# Patient Record
Sex: Male | Born: 1975 | Race: White | Hispanic: No | Marital: Married | Smoking: Former smoker
Health system: Southern US, Community
[De-identification: ages and names within clinical notes are randomized; demographics above are authoritative.]

## PROBLEM LIST (undated history)

## (undated) DIAGNOSIS — I209 Angina pectoris, unspecified: Secondary | ICD-10-CM

## (undated) DIAGNOSIS — I2510 Atherosclerotic heart disease of native coronary artery without angina pectoris: Secondary | ICD-10-CM

## (undated) DIAGNOSIS — E785 Hyperlipidemia, unspecified: Secondary | ICD-10-CM

## (undated) DIAGNOSIS — M542 Cervicalgia: Secondary | ICD-10-CM

## (undated) DIAGNOSIS — I10 Essential (primary) hypertension: Secondary | ICD-10-CM

## (undated) DIAGNOSIS — K219 Gastro-esophageal reflux disease without esophagitis: Secondary | ICD-10-CM

## (undated) DIAGNOSIS — G4730 Sleep apnea, unspecified: Secondary | ICD-10-CM

## (undated) HISTORY — DX: Cervicalgia: M54.2

## (undated) HISTORY — PX: CARDIAC CATHETERIZATION: SHX172

## (undated) HISTORY — PX: ELBOW SURGERY: SHX618

## (undated) HISTORY — DX: Hyperlipidemia, unspecified: E78.5

## (undated) HISTORY — DX: Essential (primary) hypertension: I10

## (undated) HISTORY — PX: HAND SURGERY: SHX662

---

## 2000-09-02 ENCOUNTER — Encounter: Payer: Self-pay | Admitting: Family Medicine

## 2000-09-02 ENCOUNTER — Encounter: Admission: RE | Admit: 2000-09-02 | Discharge: 2000-09-02 | Payer: Self-pay | Admitting: Family Medicine

## 2003-06-18 ENCOUNTER — Ambulatory Visit (HOSPITAL_BASED_OUTPATIENT_CLINIC_OR_DEPARTMENT_OTHER): Admission: RE | Admit: 2003-06-18 | Discharge: 2003-06-18 | Payer: Self-pay | Admitting: Internal Medicine

## 2007-01-03 ENCOUNTER — Ambulatory Visit: Payer: Self-pay | Admitting: Pulmonary Disease

## 2007-01-09 ENCOUNTER — Ambulatory Visit (HOSPITAL_BASED_OUTPATIENT_CLINIC_OR_DEPARTMENT_OTHER): Admission: RE | Admit: 2007-01-09 | Discharge: 2007-01-09 | Payer: Self-pay | Admitting: Pulmonary Disease

## 2007-01-20 ENCOUNTER — Ambulatory Visit: Payer: Self-pay | Admitting: Pulmonary Disease

## 2007-01-24 ENCOUNTER — Ambulatory Visit: Payer: Self-pay | Admitting: Pulmonary Disease

## 2007-02-08 DIAGNOSIS — G4733 Obstructive sleep apnea (adult) (pediatric): Secondary | ICD-10-CM | POA: Insufficient documentation

## 2010-07-21 NOTE — Procedures (Signed)
David Christian, David Christian                ACCOUNT NO.:  [id number]   MEDICAL RECORD NO.:  [id number]          PATIENT TYPE:  OUT   LOCATION:  SLEEP CENTER                 FACILITY:  [facility]   PHYSICIAN:  Barbaraann Share, MD,FCCPDATE OF BIRTH:  12-06-1976   DATE OF STUDY:  01/09/2007                            NOCTURNAL POLYSOMNOGRAM   REFERRING PHYSICIAN:  Barbaraann Share, MD,FCCP   INDICATION FOR STUDY:  Hypersomnia with sleep apnea.   EPWORTH SLEEPINESS SCORE:  17   MEDICATIONS:   SLEEP ARCHITECTURE:  The patient had a total sleep time of 354 minutes  with minimal slow wave sleep and only 41 minutes of REM.  Sleep onset  latency was prolonged at 64 minutes and REM onset was prolonged at 149  minutes.  Sleep efficiency was decreased at 80%.   RESPIRATORY DATA:  The patient was found to have 20 obstructive apneas  and 57 obstructive hypopneas for an apnea/hypopnea index of 14 events  per hour.  The events were not positional but they were clearly worse  with respect to desaturation during REM.  There was moderate snoring  noted throughout.   OXYGEN DATA:  There was O2 desaturation as low as 70% with the patient's  obstructive events.   CARDIAC DATA:  No clinically significant cardiac arrhythmias were noted.   MOVEMENT-PARASOMNIA:  None.   IMPRESSIONS-RECOMMENDATIONS:  Mild obstructive sleep apnea/hypopnea  syndrome with an apnea/hypopnea index of 14 events per hour and oxygen  desaturation as low as 70%.  Treatment for this degree of sleep apnea  can include weight loss alone if applicable, upper airway surgery, oral  appliance, and also CPAP.  Clinical correlation is suggested.      Barbaraann Share, MD,FCCP  Diplomate, American Board of Sleep  Medicine  Electronically Signed    [facility]  D:  01/21/2007 14:48:00  T:  01/22/2007 21:03:32  Job:  [id number]

## 2010-07-21 NOTE — Assessment & Plan Note (Signed)
[facility]                             PULMONARY OFFICE NOTE   NAME:David Christian, David Christian                       MRN:          [id number]  DATE:01/03/2007                            DOB:          25-Dec-1975    REFERRING PHYSICIAN:  Gabriel Earing, M.D.   SLEEP MEDICINE CONSULTATION   HISTORY OF PRESENT ILLNESS:  The patient is a 35 year old male whom I  have been asked to see for obstructive sleep apnea. The patient states  that he did have nocturnal polysomnography approximately three years ago  where he was diagnosed with mild obstructive sleep apnea. That data is  not available to me currently. The patient was felt not to be a  candidate for CPAP or surgery and was asked to work on weight loss and  also placed on Vivactil as a trial which did not help. The patient  states that currently he has been having a lot of snoring as well as  pauses in his breathing during sleep according to his girlfriend. He  typically goes to bed between 9:30 and 10:30 and gets up at 6 a.m. to  start his day. However, it is very difficult for him to get up and he  will often not arise until between 7 and 10 a.m. and will often be late  for work. He is not rested in the mornings whenever he arises and  describes a chronic a.m. headache. The patient also states that he does  a lot of kicking during the night according to his girlfriend and does  have some suggestion of the restless leg syndrome in the evenings.  During the day, the patient works as a Chartered certified accountant and states that he has  significant inappropriate daytime sleepiness with periods of inactivity.  He will fall asleep very easily with TV and movies in the evening and  does have some sleepiness with driving. The patient states that his  weight is actually down 30 pounds from his prior sleep study.   PAST MEDICAL HISTORY:  Unremarkable.   CURRENT MEDICATIONS:  The patient takes no medications.   ALLERGIES:  No known  drug allergies.   SOCIAL HISTORY:  He is single. He has a history of smoking one pack per  day for the last 5-6 years.   FAMILY HISTORY:  Is noncontributory in first-degree relatives.   REVIEW OF SYSTEMS:  As per history of present illness. Also, see the  patient intake form documented in the chart.   PHYSICAL EXAMINATION:  In general, he is an obese male in no acute  distress. Blood pressure 138/96, pulse 94, temperature 99.0, weight 260  pounds. He is 6 feet tall. O2 saturation on room air is 100%.  HEENT: Pupils equal, round and reactive to light and accommodation.  Extraocular muscles are intact. Nares: Shows deviated septum to the left  with turbinate hypertrophy. Oropharynx shows very large tonsils with  elongation of the soft palate and uvula.  NECK: Is supple, but large without JVD, lymphadenopathy or  thyromegaly.  CHEST: Is totally clear.  CARDIAC: Reveals regular rate and rhythm. No  murmurs, rubs or gallops.  ABDOMEN: Soft and nontender with good bowel sounds.  GENITAL, RECTAL, BREASTS: Was not done and not indicated.  LOWER EXTREMITIES: Are without edema. Pulses are intact distally.  NEUROLOGIC: Alert and oriented with no obvious motor deficits.   IMPRESSION:  Probable obstructive sleep apnea. The patient gives a very  good history for this, is obese and has abnormal upper airway anatomy.  He believes that his symptoms are much worse than at the time of his  prior sleep study. At this point in time, I think he needs a repeat  sleep study with followup thereafter. He is agreeable to this approach.   PLAN:  1. Schedule for nocturnal polysomnogram.  2. Work on weight loss.  3. Will try to obtain his old study data.     Barbaraann Share, MD,FCCP  Electronically Signed    [facility]  DD: 01/04/2007  DT: 01/04/2007  Job #: [id number]   cc:   Gabriel Earing, M.D.

## 2013-04-06 ENCOUNTER — Encounter: Payer: Self-pay | Admitting: Pulmonary Disease

## 2013-04-06 ENCOUNTER — Encounter: Payer: Self-pay | Admitting: Internal Medicine

## 2013-05-23 ENCOUNTER — Ambulatory Visit (INDEPENDENT_AMBULATORY_CARE_PROVIDER_SITE_OTHER): Payer: BC Managed Care – PPO | Admitting: Internal Medicine

## 2013-05-23 ENCOUNTER — Encounter: Payer: Self-pay | Admitting: Internal Medicine

## 2013-05-23 ENCOUNTER — Telehealth: Payer: Self-pay | Admitting: Internal Medicine

## 2013-05-23 VITALS — BP 142/104 | HR 98 | Temp 98.9°F | Ht 70.75 in | Wt 251.0 lb

## 2013-05-23 DIAGNOSIS — R030 Elevated blood-pressure reading, without diagnosis of hypertension: Secondary | ICD-10-CM

## 2013-05-23 DIAGNOSIS — K644 Residual hemorrhoidal skin tags: Secondary | ICD-10-CM | POA: Insufficient documentation

## 2013-05-23 DIAGNOSIS — Z0000 Encounter for general adult medical examination without abnormal findings: Secondary | ICD-10-CM

## 2013-05-23 DIAGNOSIS — E669 Obesity, unspecified: Secondary | ICD-10-CM

## 2013-05-23 LAB — LIPID PANEL
Cholesterol: 284 mg/dL — ABNORMAL HIGH (ref 0–200)
HDL: 38.9 mg/dL — ABNORMAL LOW (ref >39.00)
LDL Cholesterol: 205 mg/dL — ABNORMAL HIGH (ref 0–99)
Total CHOL/HDL Ratio: 7
Triglycerides: 200 mg/dL — ABNORMAL HIGH (ref 0.0–149.0)
VLDL: 40 mg/dL (ref 0.0–40.0)

## 2013-05-23 LAB — CBC
HCT: 47.7 % (ref 39.0–52.0)
Hemoglobin: 16.4 g/dL (ref 13.0–17.0)
MCHC: 34.3 g/dL (ref 30.0–36.0)
MCV: 91.4 fl (ref 78.0–100.0)
Platelets: 214 10*3/uL (ref 150.0–400.0)
RBC: 5.22 Mil/uL (ref 4.22–5.81)
RDW: 12.8 % (ref 11.5–14.6)
WBC: 4.9 10*3/uL (ref 4.5–10.5)

## 2013-05-23 LAB — COMPREHENSIVE METABOLIC PANEL
ALT: 15 U/L (ref 0–53)
AST: 20 U/L (ref 0–37)
Albumin: 4.4 g/dL (ref 3.5–5.2)
Alkaline Phosphatase: 54 U/L (ref 39–117)
BUN: 11 mg/dL (ref 6–23)
CO2: 29 mEq/L (ref 19–32)
Calcium: 9.2 mg/dL (ref 8.4–10.5)
Chloride: 99 mEq/L (ref 96–112)
Creatinine, Ser: 1.2 mg/dL (ref 0.4–1.5)
GFR: 76.33 mL/min (ref >60.00)
Glucose, Bld: 96 mg/dL (ref 70–99)
Potassium: 3.8 mEq/L (ref 3.5–5.1)
Sodium: 136 mEq/L (ref 135–145)
Total Bilirubin: 0.5 mg/dL (ref 0.3–1.2)
Total Protein: 7.7 g/dL (ref 6.0–8.3)

## 2013-05-23 LAB — HEMOGLOBIN A1C: Hgb A1c MFr Bld: 5.2 % (ref 4.6–6.5)

## 2013-05-23 LAB — TSH: TSH: 0.81 u[IU]/mL (ref 0.35–5.50)

## 2013-05-23 NOTE — Progress Notes (Signed)
Pre visit review using our clinic review tool, if applicable. No additional management support is needed unless otherwise documented below in the visit note. 

## 2013-05-23 NOTE — Assessment & Plan Note (Signed)
?   Medication related (he has been taking claritin D and cough syrup) Stop those, will recheck BP in 2 weeks

## 2013-05-23 NOTE — Assessment & Plan Note (Addendum)
Continue preparation H prn Avoid straining Take Colace OTC prn Increase your water intake

## 2013-05-23 NOTE — Progress Notes (Signed)
HPI Pt presents to the clinic today to establish care. He has not had a PCP in many years. He does have a few concerns today.   1: He c/o post nasal drip and cough. He reports this started 2 days ago. The cough is productive of thick green mucous. The cough is worse at night. He denies fever, but has had chills and body aches. He also c/o headache and fatigue. He has tried Claritin- D and Delsym OTC. He has had sick contacts. He has a history of seasonal allergies but denies breathing problems.  2: Hemorrhoids: He does c/o of rectal bleeding and pain. He reports this started about 2 years ago. He does have a BM daily. He denies anal itching. He does not feel like he strains with bowel movements. He has tried preparation H OTC with some relief.  Flu: 2013 Tetanus: < 10 years ago Dentist: as needed  History reviewed. No pertinent past medical history.  No current outpatient prescriptions on file.   No current facility-administered medications for this visit.    No Known Allergies  Family History  Problem Relation Age of Onset  . Arthritis Maternal Grandfather   . Arthritis Paternal Grandmother   . Diabetes Neg Hx   . Heart disease Neg Hx   . Stroke Neg Hx     History   Social History  . Marital Status: Married    Spouse Name: N/A    Number of Children: N/A  . Years of Education: N/A   Occupational History  . Not on file.   Social History Main Topics  . Smoking status: Never Smoker   . Smokeless tobacco: Current User    Types: Chew  . Alcohol Use: 0.6 oz/week    1 Cans of beer per week     Comment: rare  . Drug Use: No  . Sexual Activity: Not on file   Other Topics Concern  . Not on file   Social History Narrative  . No narrative on file    ROS:  Constitutional: Pt reports fatigue. Denies fever, malaise, headache or abrupt weight changes.  HEENT: Pt reports sore throat. Denies eye pain, eye redness, ear pain, ringing in the ears, wax buildup, runny nose, nasal  congestion, bloody nose, or sore throat. Respiratory: Pt reports cough. Denies difficulty breathing, shortness of breath, or sputum production.   Cardiovascular: Denies chest pain, chest tightness, palpitations or swelling in the hands or feet.  Gastrointestinal: Denies abdominal pain, bloating, constipation, diarrhea or blood in the stool.  GU: Denies frequency, urgency, pain with urination, blood in urine, odor or discharge. Musculoskeletal: Denies decrease in range of motion, difficulty with gait, muscle pain or joint pain and swelling.  Skin: Denies redness, rashes, lesions or ulcercations.  Neurological: Denies dizziness, difficulty with memory, difficulty with speech or problems with balance and coordination.   No other specific complaints in a complete review of systems (except as listed in HPI above).  PE:  BP 142/104  Pulse 98  Temp(Src) 98.9 F (37.2 C) (Oral)  Ht 5' 10.75" (1.797 m)  Wt 251 lb (113.853 kg)  BMI 35.26 kg/m2  SpO2 98% Wt Readings from Last 3 Encounters:  05/23/13 251 lb (113.853 kg)    General: Appears his stated age, obese but well developed, well nourished in NAD. HEENT: Head: normal shape and size; Eyes: sclera white, no icterus, conjunctiva pink, PERRLA and EOMs intact; Ears: Tm's gray and intact, normal light reflex; Nose: mucosa pink and moist, septum midline;  Throat/Mouth: Teeth present, mucosa pink and moist, no lesions or ulcerations noted.  Neck: Normal range of motion. Neck supple, trachea midline. No massses, lumps or thyromegaly present.  Cardiovascular: Normal rate and rhythm. S1,S2 noted.  No murmur, rubs or gallops noted. No JVD or BLE edema. No carotid bruits noted. Pulmonary/Chest: Normal effort and positive vesicular breath sounds. No respiratory distress. No wheezes, rales or ronchi noted.  Abdomen: Soft and nontender. Normal bowel sounds, no bruits noted. No distention or masses noted. Liver, spleen and kidneys non palpable. Rectal: 2  small external hemorrhoids, not bleeding, not thrombosed. Musculoskeletal: Normal range of motion. No signs of joint swelling. No difficulty with gait.  Neurological: Alert and oriented. Cranial nerves II-XII intact. Coordination normal. +DTRs bilaterally. Psychiatric: Mood and affect normal. Behavior is normal. Judgment and thought content normal.      Assessment and Plan:  Preventative Health Maintenance:  Encouraged him to work on diet or exercise Encouraged him to visit a dentist on a regular basis Will obtain screening labs at this time including A1C today  RTC in 2 weeks to reasses BP

## 2013-05-23 NOTE — Telephone Encounter (Signed)
Relevant patient education mailed to patient.  

## 2013-05-23 NOTE — Assessment & Plan Note (Signed)
Encouraged him to work on diet and exercise 

## 2013-05-23 NOTE — Patient Instructions (Addendum)

## 2013-05-25 ENCOUNTER — Encounter: Payer: Self-pay | Admitting: Internal Medicine

## 2013-06-14 ENCOUNTER — Encounter: Payer: Self-pay | Admitting: Internal Medicine

## 2013-07-03 ENCOUNTER — Encounter: Payer: Self-pay | Admitting: Internal Medicine

## 2013-07-03 ENCOUNTER — Ambulatory Visit (INDEPENDENT_AMBULATORY_CARE_PROVIDER_SITE_OTHER): Payer: BC Managed Care – PPO | Admitting: Internal Medicine

## 2013-07-03 VITALS — BP 130/88 | HR 73 | Temp 98.2°F | Ht 70.75 in | Wt 250.5 lb

## 2013-07-03 DIAGNOSIS — K644 Residual hemorrhoidal skin tags: Secondary | ICD-10-CM

## 2013-07-03 DIAGNOSIS — R030 Elevated blood-pressure reading, without diagnosis of hypertension: Secondary | ICD-10-CM

## 2013-07-03 MED ORDER — HYDROCORTISONE ACE-PRAMOXINE 2.5-1 % RE CREA: 1.0000 "application " | TOPICAL_CREAM | Freq: Three times a day (TID) | RECTAL | Status: DC

## 2013-07-03 NOTE — Patient Instructions (Addendum)
Hemorrhoids Hemorrhoids are swollen veins around the rectum or anus. There are two types of hemorrhoids:   Internal hemorrhoids. These occur in the veins just inside the rectum. They may poke through to the outside and become irritated and painful.  External hemorrhoids. These occur in the veins outside the anus and can be felt as a painful swelling or hard lump near the anus. CAUSES  Pregnancy.   Obesity.   Constipation or diarrhea.   Straining to have a bowel movement.   Sitting for long periods on the toilet.  Heavy lifting or other activity that caused you to strain.  Anal intercourse. SYMPTOMS   Pain.   Anal itching or irritation.   Rectal bleeding.   Fecal leakage.   Anal swelling.   One or more lumps around the anus.  DIAGNOSIS  Your caregiver may be able to diagnose hemorrhoids by visual examination. Other examinations or tests that may be performed include:   Examination of the rectal area with a gloved hand (digital rectal exam).   Examination of anal canal using a small tube (scope).   A blood test if you have lost a significant amount of blood.  A test to look inside the colon (sigmoidoscopy or colonoscopy). TREATMENT Most hemorrhoids can be treated at home. However, if symptoms do not seem to be getting better or if you have a lot of rectal bleeding, your caregiver may perform a procedure to help make the hemorrhoids get smaller or remove them completely. Possible treatments include:   Placing a rubber band at the base of the hemorrhoid to cut off the circulation (rubber band ligation).   Injecting a chemical to shrink the hemorrhoid (sclerotherapy).   Using a tool to burn the hemorrhoid (infrared light therapy).   Surgically removing the hemorrhoid (hemorrhoidectomy).   Stapling the hemorrhoid to block blood flow to the tissue (hemorrhoid stapling).  HOME CARE INSTRUCTIONS   Eat foods with fiber, such as whole grains, beans,  nuts, fruits, and vegetables. Ask your doctor about taking products with added fiber in them (fibersupplements).  Increase fluid intake. Drink enough water and fluids to keep your urine clear or pale yellow.   Exercise regularly.   Go to the bathroom when you have the urge to have a bowel movement. Do not wait.   Avoid straining to have bowel movements.   Keep the anal area dry and clean. Use wet toilet paper or moist towelettes after a bowel movement.   Medicated creams and suppositories may be used or applied as directed.   Only take over-the-counter or prescription medicines as directed by your caregiver.   Take warm sitz baths for 15 20 minutes, 3 4 times a day to ease pain and discomfort.   Place ice packs on the hemorrhoids if they are tender and swollen. Using ice packs between sitz baths may be helpful.   Put ice in a plastic bag.   Place a towel between your skin and the bag.   Leave the ice on for 15 20 minutes, 3 4 times a day.   Do not use a donut-shaped pillow or sit on the toilet for long periods. This increases blood pooling and pain.  SEEK MEDICAL CARE IF:  You have increasing pain and swelling that is not controlled by treatment or medicine.  You have uncontrolled bleeding.  You have difficulty or you are unable to have a bowel movement.  You have pain or inflammation outside the area of the hemorrhoids. MAKE SURE YOU:    Understand these instructions.  Will watch your condition.  Will get help right away if you are not doing well or get worse. Document Released: 02/20/2000 Document Revised: 02/09/2012 Document Reviewed: 12/28/2011 ExitCare Patient Information 2014 [employer].  

## 2013-07-03 NOTE — Progress Notes (Signed)
Pre visit review using our clinic review tool, if applicable. No additional management support is needed unless otherwise documented below in the visit note. 

## 2013-07-03 NOTE — Progress Notes (Signed)
Subjective:    Patient ID: David Christian, male    DOB: April 15, 1975, 38 y.o.   MRN: [id number]  HPI  Pt presents to the clinic today for a 2 week followup of BP. At his first visit, his BP was 142/104. He had been taking Claritin D for quite a few days prior to that. He stopped the medication and his here today to recheck BP. His BP today is 130/88. He denies headache, dizziness, blurred vision, chest pain, chest tightness or shortness of breath.  He continues to have issues with his external hemorrhoids. He reports the preparation H is not working. He would like to know what his treatment options are.  Review of Systems      History reviewed. No pertinent past medical history.  Current Outpatient Prescriptions  Medication Sig Dispense Refill  . cetirizine (ZYRTEC) 10 MG tablet Take 10 mg by mouth daily.       No current facility-administered medications for this visit.    No Known Allergies  Family History  Problem Relation Age of Onset  . Arthritis Maternal Grandfather   . Arthritis Paternal Grandmother   . Diabetes Neg Hx   . Heart disease Neg Hx   . Stroke Neg Hx     History   Social History  . Marital Status: Married    Spouse Name: N/A    Number of Children: N/A  . Years of Education: N/A   Occupational History  . Not on file.   Social History Main Topics  . Smoking status: Never Smoker   . Smokeless tobacco: Current User    Types: Chew  . Alcohol Use: 0.6 oz/week    1 Cans of beer per week     Comment: rare  . Drug Use: No  . Sexual Activity: Not on file   Other Topics Concern  . Not on file   Social History Narrative  . No narrative on file     Constitutional: Denies fever, malaise, fatigue, headache or abrupt weight changes.  Respiratory: Denies difficulty breathing, shortness of breath, cough or sputum production.   Cardiovascular: Denies chest pain, chest tightness, palpitations or swelling in the hands or feet.  GI: Pt reports external  hemorrhoids. He denies bloating, constipation or diarrhea. Neurological: Denies dizziness, difficulty with memory, difficulty with speech or problems with balance and coordination.   No other specific complaints in a complete review of systems (except as listed in HPI above).  Objective:   Physical Exam   BP 130/88  Pulse 73  Temp(Src) 98.2 F (36.8 C) (Oral)  Ht 5' 10.75" (1.797 m)  Wt 250 lb 8 oz (113.626 kg)  BMI 35.19 kg/m2  SpO2 99% Wt Readings from Last 3 Encounters:  07/03/13 250 lb 8 oz (113.626 kg)  05/23/13 251 lb (113.853 kg)    General: Appears his stated age, obese but well developed, well nourished in NAD.[name].  Cardiovascular: Normal rate and rhythm. S1,S2 noted.  No murmur, rubs or gallops noted. No JVD or BLE edema. No carotid bruits noted. Pulmonary/Chest: Normal effort and positive vesicular breath sounds. No respiratory distress. No wheezes, rales or ronchi noted.  Rectal: deferred.  Neurological: Alert and oriented.    BMET    Component Value Date/Time   NA 136 05/23/2013 1416   K 3.8 05/23/2013 1416   CL 99 05/23/2013 1416   CO2 29 05/23/2013 1416   GLUCOSE 96 05/23/2013 1416   BUN 11 05/23/2013 1416   CREATININE 1.2 05/23/2013 1416  CALCIUM 9.2 05/23/2013 1416    Lipid Panel     Component Value Date/Time   CHOL 284* 05/23/2013 1416   TRIG 200.0* 05/23/2013 1416   HDL 38.90* 05/23/2013 1416   CHOLHDL 7 05/23/2013 1416   VLDL 40.0 05/23/2013 1416   LDLCALC 205* 05/23/2013 1416    CBC    Component Value Date/Time   WBC 4.9 05/23/2013 1416   RBC 5.22 05/23/2013 1416   HGB 16.4 05/23/2013 1416   HCT 47.7 05/23/2013 1416   PLT 214.0 05/23/2013 1416   MCV 91.4 05/23/2013 1416   MCHC 34.3 05/23/2013 1416   RDW 12.8 05/23/2013 1416    Hgb A1C Lab Results  Component Value Date   HGBA1C 5.2 05/23/2013        Assessment & Plan:

## 2013-07-03 NOTE — Assessment & Plan Note (Signed)
Not resolved with preparation H Will start anal pram

## 2013-07-03 NOTE — Assessment & Plan Note (Signed)
Better off Claritin D Will continue to monitor

## 2013-08-29 ENCOUNTER — Encounter: Payer: Self-pay | Admitting: Internal Medicine

## 2013-08-29 ENCOUNTER — Ambulatory Visit (INDEPENDENT_AMBULATORY_CARE_PROVIDER_SITE_OTHER): Payer: BC Managed Care – PPO | Admitting: Internal Medicine

## 2013-08-29 VITALS — BP 126/88 | HR 86 | Temp 98.9°F | Wt 255.0 lb

## 2013-08-29 DIAGNOSIS — B9789 Other viral agents as the cause of diseases classified elsewhere: Secondary | ICD-10-CM

## 2013-08-29 DIAGNOSIS — J329 Chronic sinusitis, unspecified: Secondary | ICD-10-CM

## 2013-08-29 DIAGNOSIS — B309 Viral conjunctivitis, unspecified: Secondary | ICD-10-CM

## 2013-08-29 MED ORDER — METHYLPREDNISOLONE ACETATE 40 MG/ML IJ SUSP
40.0000 mg | Freq: Once | INTRAMUSCULAR | Status: AC
  Administered 2013-08-29: 40 mg via INTRAMUSCULAR

## 2013-08-29 MED ORDER — METHYLPREDNISOLONE ACETATE 40 MG/ML IJ SUSP: 80.0000 mg | Freq: Once | INTRAMUSCULAR | Status: DC

## 2013-08-29 NOTE — Patient Instructions (Addendum)
Viral Conjunctivitis Conjunctivitis is an irritation (inflammation) of the clear membrane that covers the white part of the eye (the conjunctiva). The irritation can also happen on the underside of the eyelids. Conjunctivitis makes the eye red or pink in color. This is what is commonly known as pink eye. Viral conjunctivitis can spread easily (contagious). CAUSES   Infection from virus on the surface of the eye.  Infection from the irritation or injury of nearby tissues such as the eyelids or cornea.  More serious inflammation or infection on the inside of the eye.  Other eye diseases.  The use of certain eye medications. SYMPTOMS  The normally white color of the eye or the underside of the eyelid is usually pink or red in color. The pink eye is usually associated with irritation, tearing and some sensitivity to light. Viral conjunctivitis is often associated with a clear, watery discharge. If a discharge is present, there may also be some blurred vision in the affected eye. DIAGNOSIS  Conjunctivitis is diagnosed by an eye exam. The eye specialist looks for changes in the surface tissues of the eye which take on changes characteristic of the specific types of conjunctivitis. A sample of any discharge may be collected on a Q-Tip (sterile swap). The sample will be sent to a lab to see whether or not the inflammation is caused by bacterial or viral infection. TREATMENT  Viral conjunctivitis will not respond to medicines that kill germs (antibiotics). Treatment is aimed at stopping a bacterial infection on top of the viral infection. The goal of treatment is to relieve symptoms (such as itching) with antihistamine drops or other eye medications.  HOME CARE INSTRUCTIONS   To ease discomfort, apply a cool, clean wash cloth to your eye for 10 to 20 minutes, 3 to 4 times a day.  Gently wipe away any drainage from the eye with a warm, wet washcloth or a cotton ball.  Wash your hands often with soap  and use paper towels to dry.  Do not share towels or washcloths. This may spread the infection.  Change or wash your pillowcase every day.  You should not use eye make-up until the infection is gone.  Stop using contacts lenses. Ask your eye professional how to sterilize or replace them before using again. This depends on the type of contact lenses used.  Do not touch the edge of the eyelid with the eye drop bottle or ointment tube when applying medications to the affected eye. This will stop you from spreading the infection to the other eye or to others. SEEK IMMEDIATE MEDICAL CARE IF:   The infection has not improved within 3 days of beginning treatment.  A watery discharge from the eye develops.  Pain in the eye increases.  The redness is spreading.  Vision becomes blurred.  An oral temperature above 102 F (38.9 C) develops, or as your caregiver suggests.  Facial pain, redness or swelling develops.  Any problems that may be related to the prescribed medicine develop. MAKE SURE YOU:   Understand these instructions.  Will watch your condition.  Will get help right away if you are not doing well or get worse. Document Released: 02/22/2005 Document Revised: 05/17/2011 Document Reviewed: 10/12/2007 ExitCare Patient Information 2015 [employer]. This information is not intended to replace advice given to you by your health care provider. Make sure you discuss any questions you have with your health care provider.  

## 2013-08-29 NOTE — Progress Notes (Signed)
HPI  Pt presents to the clinic today with c/o redness and drainage from both eyes. He reports this started 2 days ago. His eyes itch and burn. He does have some blurred vision. He has tried Visine without relief.  Additionally, he c/o chest and nasal congestion. He reports this started 2 weeks ago. The cough is productive of thick yellow mucous. He denies fever, chills or body aches. He has tried OTC cold medicine without relief. He has not been taking his zyrtec OTC.   Review of Systems     No past medical history on file.  Family History  Problem Relation Age of Onset  . Arthritis Maternal Grandfather   . Arthritis Paternal Grandmother   . Diabetes Neg Hx   . Heart disease Neg Hx   . Stroke Neg Hx     History   Social History  . Marital Status: Married    Spouse Name: N/A    Number of Children: N/A  . Years of Education: N/A   Occupational History  . Not on file.   Social History Main Topics  . Smoking status: Never Smoker   . Smokeless tobacco: Current User    Types: Chew  . Alcohol Use: 0.6 oz/week    1 Cans of beer per week     Comment: rare  . Drug Use: No  . Sexual Activity: Not on file   Other Topics Concern  . Not on file   Social History Narrative  . No narrative on file    No Known Allergies   Constitutional:  Denies headache, fatigue, fever or abrupt weight changes.  HEENT:  Positive nasal congestion, eye pain and eye redness,. Denies  pressure behind the eyes, facial pain, ear pain, ringing in the ears, wax buildup, runny nose or bloody nose. Respiratory: Positive cough. Denies difficulty breathing or shortness of breath.  Cardiovascular: Denies chest pain, chest tightness, palpitations or swelling in the hands or feet.   No other specific complaints in a complete review of systems (except as listed in HPI above).  Objective:   BP 126/88  Pulse 86  Temp(Src) 98.9 F (37.2 C) (Oral)  Wt 255 lb (115.667 kg)  SpO2 98% Wt Readings from Last 3  Encounters:  08/29/13 255 lb (115.667 kg)  07/03/13 250 lb 8 oz (113.626 kg)  05/23/13 251 lb (113.853 kg)     General: Appears his stated age, well developed, well nourished in NAD. HEENT: Head: normal shape and size, no sinus tenderness noted; Eyes: sclera injected, no icterus, conjunctiva erythematous, PERRLA and EOMs intact; Ears: Tm's gray and intact, normal light reflex; Nose: mucosa pink and moist, septum midline; Throat/Mouth: + PND. Teeth present, mucosa erythematous and moist, no exudate noted, no lesions or ulcerations noted.  Neck: Neck supple, trachea midline. No massses, lumps or thyromegaly present.  Cardiovascular: Normal rate and rhythm. S1,S2 noted.  No murmur, rubs or gallops noted. No JVD or BLE edema. No carotid bruits noted. Pulmonary/Chest: Normal effort and positive vesicular breath sounds. No respiratory distress. No wheezes, rales or ronchi noted.      Assessment & Plan:   Viral Sinusitis with Viral Conjunctivitis:  Get some rest and drink plenty of water Warm compresses to the eye TID for copfort Ibuprofen for pain/inflammation Ok to continue Visine eye drops Take the zyrtec and nasocort daily 80 mg Depo IM today If no improvement by Friday, will call in Augmentin BID x 10 days  RTC as needed or if symptoms persist.

## 2013-08-29 NOTE — Addendum Note (Signed)
Addended by: Roena MaladyEVONTENNO, Audon Heymann Y on: 08/29/2013 11:26 AM   Modules accepted: Orders

## 2013-08-29 NOTE — Progress Notes (Signed)
Pre visit review using our clinic review tool, if applicable. No additional management support is needed unless otherwise documented below in the visit note. 

## 2013-10-02 ENCOUNTER — Ambulatory Visit: Payer: BC Managed Care – PPO | Admitting: Internal Medicine

## 2013-11-30 ENCOUNTER — Other Ambulatory Visit: Payer: Self-pay | Admitting: Internal Medicine

## 2013-11-30 NOTE — Telephone Encounter (Signed)
Last filled 07/03/13--okay to refill?--please advise

## 2014-03-20 ENCOUNTER — Other Ambulatory Visit: Payer: Self-pay

## 2014-03-20 MED ORDER — HYDROCORTISONE ACE-PRAMOXINE 2.5-1 % RE CREA: TOPICAL_CREAM | Freq: Three times a day (TID) | RECTAL | Status: DC

## 2014-08-21 ENCOUNTER — Ambulatory Visit (INDEPENDENT_AMBULATORY_CARE_PROVIDER_SITE_OTHER): Payer: PRIVATE HEALTH INSURANCE | Admitting: Internal Medicine

## 2014-08-21 ENCOUNTER — Encounter: Payer: Self-pay | Admitting: Internal Medicine

## 2014-08-21 VITALS — BP 144/88 | HR 84 | Temp 98.1°F | Wt 258.0 lb

## 2014-08-21 DIAGNOSIS — R221 Localized swelling, mass and lump, neck: Secondary | ICD-10-CM

## 2014-08-21 DIAGNOSIS — I10 Essential (primary) hypertension: Secondary | ICD-10-CM | POA: Diagnosis not present

## 2014-08-21 LAB — CBC
HCT: 45.2 % (ref 39.0–52.0)
Hemoglobin: 15.3 g/dL (ref 13.0–17.0)
MCHC: 33.8 g/dL (ref 30.0–36.0)
MCV: 91.4 fl (ref 78.0–100.0)
Platelets: 260 10*3/uL (ref 150.0–400.0)
RBC: 4.94 Mil/uL (ref 4.22–5.81)
RDW: 12.6 % (ref 11.5–15.5)
WBC: 5.8 10*3/uL (ref 4.0–10.5)

## 2014-08-21 LAB — BASIC METABOLIC PANEL
BUN: 11 mg/dL (ref 6–23)
CALCIUM: 9.3 mg/dL (ref 8.4–10.5)
CO2: 29 mEq/L (ref 19–32)
Chloride: 102 mEq/L (ref 96–112)
Creatinine, Ser: 1.02 mg/dL (ref 0.40–1.50)
GFR: 87.07 mL/min (ref >60.00)
Glucose, Bld: 99 mg/dL (ref 70–99)
Potassium: 3.8 mEq/L (ref 3.5–5.1)
Sodium: 138 mEq/L (ref 135–145)

## 2014-08-21 LAB — TSH: TSH: 0.9 u[IU]/mL (ref 0.35–4.50)

## 2014-08-21 MED ORDER — HYDROCHLOROTHIAZIDE 12.5 MG PO CAPS: 12.5000 mg | ORAL_CAPSULE | Freq: Every day | ORAL | Status: DC

## 2014-08-21 NOTE — Addendum Note (Signed)
Addended by: Baldomero Lamy on: 08/21/2014 03:36 PM   Modules accepted: Orders

## 2014-08-21 NOTE — Assessment & Plan Note (Addendum)
ECG today- normal Will start HCTZ 12. 5 mg daily CBC and BMET today Discussed how weight loss can improve his BP

## 2014-08-21 NOTE — Progress Notes (Addendum)
Subjective:    Patient ID: David Christian, male    DOB: Jul 19, 1975, 39 y.o.   MRN: [id number]  HPI  Pt presents to the clinic today with c/o elevated blood pressure. He has noticed this over the last month or so. He reports it has been as high as 144/91. He has had some associated lightheadedness, intermittent chest tightness/pressure, and fatigue. He also fills like his vision is "off".  He has had some elevated blood pressure in the past but has never been treated for high blood pressure. He denies dizziness, palpitations or shortness of breath. BP today is 144/88.  He also c/o a knot on his neck. He reports it has been there 2-3 years. He has noticed that it has gotten larger and changed shapes at times. He also reports that it has a foul odor to it at times. It is not painful. It does not interfere with his ability to swallow or breath. He has not tried anything OTC. He would like to know what it is though.  Review of Systems      History reviewed. No pertinent past medical history.  Current Outpatient Prescriptions  Medication Sig Dispense Refill  . cetirizine (ZYRTEC) 10 MG tablet Take 10 mg by mouth daily.    . hydrochlorothiazide (MICROZIDE) 12.5 MG capsule Take 1 capsule (12.5 mg total) by mouth daily. 30 capsule 0   No current facility-administered medications for this visit.    No Known Allergies  Family History  Problem Relation Age of Onset  . Arthritis Maternal Grandfather   . Arthritis Paternal Grandmother   . Diabetes Neg Hx   . Heart disease Neg Hx   . Stroke Neg Hx     History   Social History  . Marital Status: Married    Spouse Name: N/A  . Number of Children: N/A  . Years of Education: N/A   Occupational History  . Not on file.   Social History Main Topics  . Smoking status: Never Smoker   . Smokeless tobacco: Current User    Types: Chew  . Alcohol Use: 0.6 oz/week    1 Cans of beer per week     Comment: rare  . Drug Use: No  . Sexual  Activity: Not on file   Other Topics Concern  . Not on file   Social History Narrative     Constitutional: Pt reports fatigue, headache. Denies fever, malaise, or abrupt weight changes.  HEENT: Pt reports blurred vision. Denies eye pain, eye redness, ear pain, ringing in the ears, wax buildup, runny nose, nasal congestion, bloody nose, or sore throat. Respiratory: Denies difficulty breathing, shortness of breath, cough or sputum production.  Cardiovascular: Pt reports chest tightness/pressure. Denies chest pain, or swelling in the hands or feet.  Skin: Pt reports mass on neck. Denies redness, rashes, lesions or ulcercations.  Neurological: Pt reports dizziness. Denies difficulty with memory, difficulty with speech or problems with balance and coordination.  Psych: Denies anxiety, depression, SI/HI.  No other specific complaints in a complete review of systems (except as listed in HPI above).  Objective:   Physical Exam   BP 144/88 mmHg  Pulse 84  Temp(Src) 98.1 F (36.7 C) (Oral)  Wt 258 lb (117.028 kg)  SpO2 98% Wt Readings from Last 3 Encounters:  08/21/14 258 lb (117.028 kg)  08/29/13 255 lb (115.667 kg)  07/03/13 250 lb 8 oz (113.626 kg)    General: Appears his stated age, obese in NAD. Skin: Warm,  dry and intact. 2 cm round mass noted on anterior neck, right of midline. HEENT: Head: normal shape and size; Eyes: sclera white, no icterus, conjunctiva pink, PERRLA and EOMs intact;  Neck:  No thyromegaly present.  Cardiovascular: Normal rate and rhythm. S1,S2 noted.  No murmur, rubs or gallops noted.  Pulmonary/Chest: Normal effort and positive vesicular breath sounds. No respiratory distress. No wheezes, rales or ronchi noted.  Neurological: Alert and oriented. Psychiatric: He does seem very anxious today.  BMET    Component Value Date/Time   NA 136 05/23/2013 1416   K 3.8 05/23/2013 1416   CL 99 05/23/2013 1416   CO2 29 05/23/2013 1416   GLUCOSE 96 05/23/2013 1416    BUN 11 05/23/2013 1416   CREATININE 1.2 05/23/2013 1416   CALCIUM 9.2 05/23/2013 1416    Lipid Panel     Component Value Date/Time   CHOL 284* 05/23/2013 1416   TRIG 200.0* 05/23/2013 1416   HDL 38.90* 05/23/2013 1416   CHOLHDL 7 05/23/2013 1416   VLDL 40.0 05/23/2013 1416   LDLCALC 205* 05/23/2013 1416    CBC    Component Value Date/Time   WBC 4.9 05/23/2013 1416   RBC 5.22 05/23/2013 1416   HGB 16.4 05/23/2013 1416   HCT 47.7 05/23/2013 1416   PLT 214.0 05/23/2013 1416   MCV 91.4 05/23/2013 1416   MCHC 34.3 05/23/2013 1416   RDW 12.8 05/23/2013 1416    Hgb A1C Lab Results  Component Value Date   HGBA1C 5.2 05/23/2013        Assessment & Plan:   Mass of neck:  Likely benign cyst But I would like to ultrasound it given the location and size Ultrasound of neck ordered- see Shirlee Limerick on your way out to schedule   RTC in 1 month to follow up BP or sooner if needed

## 2014-08-21 NOTE — Progress Notes (Signed)
Pre visit review using our clinic review tool, if applicable. No additional management support is needed unless otherwise documented below in the visit note. 

## 2014-08-21 NOTE — Patient Instructions (Addendum)
Your EKG today is normal- that means your heart is beating at a normal pace and rhythm We are checking labs today, I will send you a letter with the results I have sent a medication into your pharmacy for your blood pressure, called HCTZ. Take it once a day every morning. See Shirlee Limerick on the way out to schedule the ultrasound of your neck RTC in 3 weeks to recheck BP  Hypertension Hypertension, commonly called high blood pressure, is when the force of blood pumping through your arteries is too strong. Your arteries are the blood vessels that carry blood from your heart throughout your body. A blood pressure reading consists of a higher number over a lower number, such as 110/72. The higher number (systolic) is the pressure inside your arteries when your heart pumps. The lower number (diastolic) is the pressure inside your arteries when your heart relaxes. Ideally you want your blood pressure below 120/80. Hypertension forces your heart to work harder to pump blood. Your arteries may become narrow or stiff. Having hypertension puts you at risk for heart disease, stroke, and other problems.  RISK FACTORS Some risk factors for high blood pressure are controllable. Others are not.  Risk factors you cannot control include:   Race. You may be at higher risk if you are African American.  Age. Risk increases with age.  Gender. Men are at higher risk than women before age 74 years. After age 65, women are at higher risk than men. Risk factors you can control include:  Not getting enough exercise or physical activity.  Being overweight.  Getting too much fat, sugar, calories, or salt in your diet.  Drinking too much alcohol. SIGNS AND SYMPTOMS Hypertension does not usually cause signs or symptoms. Extremely high blood pressure (hypertensive crisis) may cause headache, anxiety, shortness of breath, and nosebleed. DIAGNOSIS  To check if you have hypertension, your health care provider will measure  your blood pressure while you are seated, with your arm held at the level of your heart. It should be measured at least twice using the same arm. Certain conditions can cause a difference in blood pressure between your right and left arms. A blood pressure reading that is higher than normal on one occasion does not mean that you need treatment. If one blood pressure reading is high, ask your health care provider about having it checked again. TREATMENT  Treating high blood pressure includes making lifestyle changes and possibly taking medicine. Living a healthy lifestyle can help lower high blood pressure. You may need to change some of your habits. Lifestyle changes may include:  Following the DASH diet. This diet is high in fruits, vegetables, and whole grains. It is low in salt, red meat, and added sugars.  Getting at least 2 hours of brisk physical activity every week.  Losing weight if necessary.  Not smoking.  Limiting alcoholic beverages.  Learning ways to reduce stress. If lifestyle changes are not enough to get your blood pressure under control, your health care provider may prescribe medicine. You may need to take more than one. Work closely with your health care provider to understand the risks and benefits. HOME CARE INSTRUCTIONS  Have your blood pressure rechecked as directed by your health care provider.   Take medicines only as directed by your health care provider. Follow the directions carefully. Blood pressure medicines must be taken as prescribed. The medicine does not work as well when you skip doses. Skipping doses also puts you at risk  for problems.   Do not smoke.   Monitor your blood pressure at home as directed by your health care provider. SEEK MEDICAL CARE IF:   You think you are having a reaction to medicines taken.  You have recurrent headaches or feel dizzy.  You have swelling in your ankles.  You have trouble with your vision. SEEK IMMEDIATE  MEDICAL CARE IF:  You develop a severe headache or confusion.  You have unusual weakness, numbness, or feel faint.  You have severe chest or abdominal pain.  You vomit repeatedly.  You have trouble breathing. MAKE SURE YOU:   Understand these instructions.  Will watch your condition.  Will get help right away if you are not doing well or get worse. Document Released: 02/22/2005 Document Revised: 07/09/2013 Document Reviewed: 12/15/2012 [facility] Patient Information 2015 [place], [place]. This information is not intended to replace advice given to you by your health care provider. Make sure you discuss any questions you have with your health care provider.

## 2014-08-22 ENCOUNTER — Ambulatory Visit
Admission: RE | Admit: 2014-08-22 | Discharge: 2014-08-22 | Disposition: A | Discharge status: Home / Self Care | Payer: 59 | Source: Ambulatory Visit | Attending: Internal Medicine | Admitting: Internal Medicine

## 2014-08-22 DIAGNOSIS — R221 Localized swelling, mass and lump, neck: Secondary | ICD-10-CM | POA: Insufficient documentation

## 2014-08-23 ENCOUNTER — Telehealth: Payer: Self-pay | Admitting: Internal Medicine

## 2014-08-23 NOTE — Telephone Encounter (Signed)
Please rerturn pts call about an ultrasound. Please use the home number listed thanks

## 2014-09-05 ENCOUNTER — Encounter: Payer: Self-pay | Admitting: Internal Medicine

## 2014-09-05 ENCOUNTER — Ambulatory Visit (INDEPENDENT_AMBULATORY_CARE_PROVIDER_SITE_OTHER): Payer: PRIVATE HEALTH INSURANCE | Admitting: Internal Medicine

## 2014-09-05 VITALS — BP 130/86 | HR 90 | Temp 98.8°F | Wt 250.0 lb

## 2014-09-05 DIAGNOSIS — I10 Essential (primary) hypertension: Secondary | ICD-10-CM

## 2014-09-05 DIAGNOSIS — R221 Localized swelling, mass and lump, neck: Secondary | ICD-10-CM

## 2014-09-05 LAB — BASIC METABOLIC PANEL
BUN: 18 mg/dL (ref 6–23)
CO2: 31 meq/L (ref 19–32)
Calcium: 9.6 mg/dL (ref 8.4–10.5)
Chloride: 97 mEq/L (ref 96–112)
Creatinine, Ser: 1.38 mg/dL (ref 0.40–1.50)
GFR: 61.41 mL/min (ref >60.00)
Glucose, Bld: 111 mg/dL — ABNORMAL HIGH (ref 70–99)
Potassium: 3.8 mEq/L (ref 3.5–5.1)
Sodium: 135 mEq/L (ref 135–145)

## 2014-09-05 MED ORDER — PRAMOXINE-HC 1-1 % EX CREA: 1.0000 "application " | TOPICAL_CREAM | Freq: Three times a day (TID) | CUTANEOUS | Status: DC

## 2014-09-05 MED ORDER — HYDROCHLOROTHIAZIDE 25 MG PO TABS: 25.0000 mg | ORAL_TABLET | Freq: Every day | ORAL | Status: DC

## 2014-09-05 NOTE — Patient Instructions (Signed)

## 2014-09-05 NOTE — Progress Notes (Signed)
Pre visit review using our clinic review tool, if applicable. No additional management support is needed unless otherwise documented below in the visit note. 

## 2014-09-05 NOTE — Progress Notes (Signed)
Subjective:    Patient ID: David Christian, male    DOB: 03-Apr-1975, 39 y.o.   MRN: [id number]  HPI  Pt presents to the clinic today for 1 month follow up of HTN. He was started on HCTZ 12.5 mg at his last visit for a BP of 144/88. His BP today is 130/86 but he does admit that he is taking the HCTZ twice a day. Before he was taking it twice a day, he was checking his BP at home and it was still running > 140/90. He denies dizziness, chest pain, or shortness of breath.  Of note, he has decided that he would like a referral for the cyst in his neck. He was seen for this at his last visit. Ultrasound showed:  IMPRESSION: The palpable abnormalities correspond to 2 complex cystic lesions.  These lesions have been there > 6 months and do not cause him any pain. He would like further evaluation.  Review of Systems      No past medical history on file.  Current Outpatient Prescriptions  Medication Sig Dispense Refill  . hydrochlorothiazide (MICROZIDE) 12.5 MG capsule Take 1 capsule (12.5 mg total) by mouth daily. 30 capsule 0  . pramoxine-hydrocortisone (ANALPRAM HC) cream Apply 1 application topically 3 (three) times daily.     . cetirizine (ZYRTEC) 10 MG tablet Take 10 mg by mouth daily.     No current facility-administered medications for this visit.    No Known Allergies  Family History  Problem Relation Age of Onset  . Arthritis Maternal Grandfather   . Arthritis Paternal Grandmother   . Diabetes Neg Hx   . Heart disease Neg Hx   . Stroke Neg Hx     History   Social History  . Marital Status: Married    Spouse Name: N/A  . Number of Children: N/A  . Years of Education: N/A   Occupational History  . Not on file.   Social History Main Topics  . Smoking status: Never Smoker   . Smokeless tobacco: Current User    Types: Chew  . Alcohol Use: 0.6 oz/week    1 Cans of beer per week     Comment: rare  . Drug Use: No  . Sexual Activity: Not on file   Other Topics  Concern  . Not on file   Social History Narrative     Constitutional: Denies fever, malaise, fatigue, headache or abrupt weight changes.  Skin: Pt reports mass in neck. Denies redness, lesion or ulceration. Respiratory: Denies difficulty breathing, shortness of breath, cough or sputum production.   Cardiovascular: Denies chest pain, chest tightness, palpitations or swelling in the hands or feet.   Neurological: Denies dizziness, difficulty with memory, difficulty with speech or problems with balance and coordination.   No other specific complaints in a complete review of systems (except as listed in HPI above).  Objective:   Physical Exam  BP 130/86 mmHg  Pulse 90  Temp(Src) 98.8 F (37.1 C) (Oral)  Wt 250 lb (113.399 kg)  SpO2 98% Wt Readings from Last 3 Encounters:  09/05/14 250 lb (113.399 kg)  08/21/14 258 lb (117.028 kg)  08/29/13 255 lb (115.667 kg)    General: Appears his stated age, obese in NAD. HEENT: Head: normal shape and size; Eyes: sclera white, no icterus, conjunctiva pink, PERRLA and EOMs intact; 2 cm round mass noted on anterior neck, right of midline. Cardiovascular: Normal rate and rhythm. S1,S2 noted.  No murmur, rubs or gallops  noted. No JVD or BLE edema. No carotid bruits noted. Pulmonary/Chest: Normal effort and positive vesicular breath sounds. No respiratory distress. No wheezes, rales or ronchi noted.  Neurological: Alert and oriented.   BMET    Component Value Date/Time   NA 138 08/21/2014 1437   K 3.8 08/21/2014 1437   CL 102 08/21/2014 1437   CO2 29 08/21/2014 1437   GLUCOSE 99 08/21/2014 1437   BUN 11 08/21/2014 1437   CREATININE 1.02 08/21/2014 1437   CALCIUM 9.3 08/21/2014 1437    Lipid Panel     Component Value Date/Time   CHOL 284* 05/23/2013 1416   TRIG 200.0* 05/23/2013 1416   HDL 38.90* 05/23/2013 1416   CHOLHDL 7 05/23/2013 1416   VLDL 40.0 05/23/2013 1416   LDLCALC 205* 05/23/2013 1416    CBC    Component Value  Date/Time   WBC 5.8 08/21/2014 1437   RBC 4.94 08/21/2014 1437   HGB 15.3 08/21/2014 1437   HCT 45.2 08/21/2014 1437   PLT 260.0 08/21/2014 1437   MCV 91.4 08/21/2014 1437   MCHC 33.8 08/21/2014 1437   RDW 12.6 08/21/2014 1437    Hgb A1C Lab Results  Component Value Date   HGBA1C 5.2 05/23/2013         Assessment & Plan:   Mass on neck:  Will refer to ENT for further evaluation

## 2014-09-05 NOTE — Assessment & Plan Note (Signed)
Well controlled He is taking 12.5 BID, I prefer him to take 25 mg once daily, new RX given BMET today  RTC in 6 months for annual exam/follow up

## 2014-10-07 ENCOUNTER — Other Ambulatory Visit: Payer: Self-pay | Admitting: Otolaryngology

## 2014-10-07 DIAGNOSIS — R221 Localized swelling, mass and lump, neck: Secondary | ICD-10-CM

## 2014-10-11 ENCOUNTER — Ambulatory Visit: Payer: PRIVATE HEALTH INSURANCE

## 2014-10-14 ENCOUNTER — Ambulatory Visit
Admission: RE | Admit: 2014-10-14 | Discharge: 2014-10-14 | Disposition: A | Discharge status: Home / Self Care | Payer: 59 | Source: Ambulatory Visit | Attending: Otolaryngology | Admitting: Otolaryngology

## 2014-10-14 DIAGNOSIS — I6523 Occlusion and stenosis of bilateral carotid arteries: Secondary | ICD-10-CM | POA: Insufficient documentation

## 2014-10-14 DIAGNOSIS — R221 Localized swelling, mass and lump, neck: Secondary | ICD-10-CM | POA: Diagnosis not present

## 2014-10-14 MED ORDER — IOHEXOL 350 MG/ML SOLN
75.0000 mL | Freq: Once | INTRAVENOUS | Status: AC | PRN
  Administered 2014-10-14: 75 mL via INTRAVENOUS

## 2014-10-28 ENCOUNTER — Encounter: Payer: Self-pay | Admitting: *Deleted

## 2014-10-29 NOTE — Discharge Instructions (Signed)

## 2014-10-31 ENCOUNTER — Ambulatory Visit
Admission: RE | Admit: 2014-10-31 | Discharge: 2014-10-31 | Disposition: A | Discharge status: Home / Self Care | Payer: 59 | Source: Ambulatory Visit | Attending: Otolaryngology | Admitting: Otolaryngology

## 2014-10-31 ENCOUNTER — Ambulatory Visit: Payer: 59 | Admitting: Anesthesiology

## 2014-10-31 ENCOUNTER — Encounter
Admission: RE | Disposition: A | Discharge status: Home / Self Care | Payer: Self-pay | Source: Ambulatory Visit | Attending: Otolaryngology

## 2014-10-31 ENCOUNTER — Encounter: Payer: Self-pay | Admitting: Anesthesiology

## 2014-10-31 DIAGNOSIS — Z8261 Family history of arthritis: Secondary | ICD-10-CM | POA: Insufficient documentation

## 2014-10-31 DIAGNOSIS — Z8489 Family history of other specified conditions: Secondary | ICD-10-CM | POA: Insufficient documentation

## 2014-10-31 DIAGNOSIS — G4733 Obstructive sleep apnea (adult) (pediatric): Secondary | ICD-10-CM | POA: Diagnosis not present

## 2014-10-31 DIAGNOSIS — L720 Epidermal cyst: Secondary | ICD-10-CM | POA: Insufficient documentation

## 2014-10-31 DIAGNOSIS — I10 Essential (primary) hypertension: Secondary | ICD-10-CM | POA: Diagnosis not present

## 2014-10-31 DIAGNOSIS — R221 Localized swelling, mass and lump, neck: Secondary | ICD-10-CM | POA: Diagnosis present

## 2014-10-31 HISTORY — PX: MASS EXCISION: SHX2000

## 2014-10-31 HISTORY — DX: Sleep apnea, unspecified: G47.30

## 2014-10-31 SURGERY — MINOR EXCISION OF MASS
Anesthesia: General | Laterality: Right | Wound class: Clean

## 2014-10-31 MED ORDER — PROPOFOL 10 MG/ML IV BOLUS
INTRAVENOUS | Status: DC | PRN
  Administered 2014-10-31: 160 mg via INTRAVENOUS

## 2014-10-31 MED ORDER — SUCCINYLCHOLINE CHLORIDE 20 MG/ML IJ SOLN
INTRAMUSCULAR | Status: DC | PRN
  Administered 2014-10-31: 100 mg via INTRAVENOUS

## 2014-10-31 MED ORDER — DEXAMETHASONE SODIUM PHOSPHATE 4 MG/ML IJ SOLN
INTRAMUSCULAR | Status: DC | PRN
  Administered 2014-10-31: 8 mg via INTRAVENOUS

## 2014-10-31 MED ORDER — OXYCODONE HCL 5 MG/5ML PO SOLN: 5.0000 mg | Freq: Once | ORAL | Status: DC | PRN

## 2014-10-31 MED ORDER — MIDAZOLAM HCL 5 MG/5ML IJ SOLN
INTRAMUSCULAR | Status: DC | PRN
  Administered 2014-10-31: 2 mg via INTRAVENOUS

## 2014-10-31 MED ORDER — OXYCODONE HCL 5 MG PO TABS: 5.0000 mg | ORAL_TABLET | Freq: Once | ORAL | Status: DC | PRN

## 2014-10-31 MED ORDER — LIDOCAINE HCL (CARDIAC) 20 MG/ML IV SOLN
INTRAVENOUS | Status: DC | PRN
  Administered 2014-10-31: 50 mg via INTRAVENOUS

## 2014-10-31 MED ORDER — LIDOCAINE-EPINEPHRINE 1 %-1:100000 IJ SOLN
INTRAMUSCULAR | Status: DC | PRN
  Administered 2014-10-31: 10 mL

## 2014-10-31 MED ORDER — FENTANYL CITRATE (PF) 100 MCG/2ML IJ SOLN
INTRAMUSCULAR | Status: DC | PRN
  Administered 2014-10-31: 100 ug via INTRAVENOUS

## 2014-10-31 MED ORDER — LACTATED RINGERS IV SOLN
INTRAVENOUS | Status: DC
  Administered 2014-10-31: 07:00:00 via INTRAVENOUS

## 2014-10-31 MED ORDER — ONDANSETRON HCL 4 MG/2ML IJ SOLN
INTRAMUSCULAR | Status: DC | PRN
  Administered 2014-10-31: 4 mg via INTRAVENOUS

## 2014-10-31 MED ORDER — GLYCOPYRROLATE 0.2 MG/ML IJ SOLN
INTRAMUSCULAR | Status: DC | PRN
Start: 2014-10-31 — End: 2014-10-31
  Administered 2014-10-31: 0.1 mg via INTRAVENOUS

## 2014-10-31 SURGICAL SUPPLY — 28 items
BLADE SURG 15 STRL LF DISP TIS (BLADE) IMPLANT
BLADE SURG 15 STRL SS (BLADE)
CORD BIP STRL DISP 12FT (MISCELLANEOUS) IMPLANT
DRAPE HEAD BAR (DRAPES) ×3 IMPLANT
DRESSING TELFA 4X3 1S ST N-ADH (GAUZE/BANDAGES/DRESSINGS) ×2 IMPLANT
ELECT CAUTERY BLADE TIP 2.5 (TIP)
ELECT CAUTERY NDL 2.0 MIC (NEEDLE) ×1 IMPLANT
ELECT CAUTERY NEEDLE 2.0 MIC (NEEDLE) ×3 IMPLANT
ELECTRODE CAUTERY BLDE TIP 2.5 (TIP) IMPLANT
GAUZE SPONGE 4X4 12PLY STRL (GAUZE/BANDAGES/DRESSINGS) IMPLANT
GLOVE PI ULTRA LF STRL 7.5 (GLOVE) ×2 IMPLANT
GLOVE PI ULTRA NON LATEX 7.5 (GLOVE) ×4
LIQUID BAND (GAUZE/BANDAGES/DRESSINGS) IMPLANT
NDL HYPO 25GX1X1/2 BEV (NEEDLE) ×1 IMPLANT
NEEDLE HYPO 25GX1X1/2 BEV (NEEDLE) ×3 IMPLANT
NS IRRIG 500ML POUR BTL (IV SOLUTION) ×3 IMPLANT
PACK DRAPE NASAL/ENT (PACKS) ×3 IMPLANT
PAD GROUND ADULT SPLIT (MISCELLANEOUS) ×3 IMPLANT
PENCIL ELECTRO HAND CTR (MISCELLANEOUS) ×3 IMPLANT
SOL PREP PVP 2OZ (MISCELLANEOUS) ×3
SOLUTION PREP PVP 2OZ (MISCELLANEOUS) ×1 IMPLANT
STRAP BODY AND KNEE 60X3 (MISCELLANEOUS) ×3 IMPLANT
SUCTION FRAZIER TIP 10 FR DISP (SUCTIONS) IMPLANT
SUT PROLENE 5 0 P 3 (SUTURE) ×5 IMPLANT
SUT PROLENE 6 0 P 1 18 (SUTURE) ×2 IMPLANT
SUT VIC AB 4-0 RB1 27 (SUTURE) ×6
SUT VIC AB 4-0 RB1 27X BRD (SUTURE) ×1 IMPLANT
SYRINGE 10CC LL (SYRINGE) ×3 IMPLANT

## 2014-10-31 NOTE — Anesthesia Procedure Notes (Addendum)
Procedure Name: Intubation Date/Time: 10/31/2014 8:06 AM Performed by: Jimmy Picket Pre-anesthesia Checklist: Patient identified, Emergency Drugs available, Suction available, Patient being monitored and Timeout performed Patient Re-evaluated:Patient Re-evaluated prior to inductionOxygen Delivery Method: Circle system utilized Preoxygenation: Pre-oxygenation with 100% oxygen Intubation Type: IV induction, Rapid sequence and Cricoid Pressure applied Laryngoscope Size: Miller and 3 Grade View: Grade I Tube type: Oral Tube size: 7.5 mm Number of attempts: 1 Placement Confirmation: ETT inserted through vocal cords under direct vision,  positive ETCO2 and breath sounds checked- equal and bilateral Tube secured with: Tape Dental Injury: Teeth and Oropharynx as per pre-operative assessment

## 2014-10-31 NOTE — Anesthesia Postprocedure Evaluation (Signed)
  Anesthesia Post-op Note  Patient: David Christian  Procedure(s) Performed: Procedure(s) with comments: EXCISION ANTERIOR NECK & RIGHT SUBMANDIBULAR CYSTS WITH COMPLEX CLOSURES (Right) - CPAP  Anesthesia type:General ETT  Patient location: PACU  Post pain: Pain level controlled  Post assessment: Post-op Vital signs reviewed, Patient's Cardiovascular Status Stable, Respiratory Function Stable, Patent Airway and No signs of Nausea or vomiting  Post vital signs: Reviewed and stable  Last Vitals:  Filed Vitals:   10/31/14 0958  BP: 132/88  Pulse: 98  Temp:   Resp: 13    Level of consciousness: awake, alert  and patient cooperative  Complications: No apparent anesthesia complications

## 2014-10-31 NOTE — H&P (Signed)
  H&P has been reviewed and no changes necessary. To be downloaded later. 

## 2014-10-31 NOTE — Op Note (Signed)
10/31/2014  9:28 AM    David Christian, David Christian  [id number]   Pre-Op Dx:  Right cheek subdermal lesion, right anterior neck subdermal lesion  Post-op Dx: Same  Proc: Excision right cheek subdermal lesion with advancement flap closure, excision right anterior neck subdermal lesion with advancement flap closure   Surg:  Renard Caperton H  Anes:  GOT  EBL:  Minimal  Comp:  None  Findings:  The right anterior cheek lesion was 1.2 cm, the right anterior neck lesion was 4 cm  Procedure: The patient was given general anesthesia by oral endotracheal intubation. He was prepped and draped in a sterile fashion with a shoulder roll to extend his neck and head turned to the left side. The right cheek lesion was marked with an elliptical incision in a vertical fashion following skin crease lines. The right anterior neck lesion was marked in a horizontal fashion following skin crease lines. The wounds were then infiltrated with 1% Xylocaine with epi 1:100,000 for vasoconstriction. 10 mL was used.  The right anterior neck excision was incised carried through skin and subcutaneous down to the scarred lesion. This was elevated off of the parotid fascia. Bleeding was controlled bipolar cautery. Skin edges were elevated anteriorly and posteriorly above the parotid fascia. These were advanced midline and closed using a 40 Vicryls suture. The skin edges were then held in position with a 60 proline.  The right anterior skin lesion was then incised around in a horizontal fashion, removing an ellipse of skin where the cyst was attached to the skin. The skin was elevated superiorly and inferiorly and bluntly dissected around the mass. It was whitish and fluid-filled. Removed completely along with the ellipse of skin. This was opened blow the platysmal layer. Wound was irrigated with saline and bleeding controlled with bipolar cautery. The fat had a defect in the middle so it was advanced laterally on both sides and sutured  with a 40 Vicryls. The skin edges were then advanced superiorly and inferiorly to the midline and the dermis was closed with 40 Vicryls. The skin edges were held in position with 60 proline in a running locking suture.  Both wounds were covered with small amount of bacitracin followed by Telfa and Tegaderm. The patient tolerated procedure well he was awakened taken to the recovery room in satisfactory condition. There were no operative complications.  Dispo:   To PACU to be discharged home  Plan:  Rest at home. He'll keep the bandage on for 48 hours and then can change it as needed. We'll give Norco for pain if needed. Follow-up in 1 week for suture removal.  Omie Ferger H  10/31/2014 9:28 AM

## 2014-10-31 NOTE — Transfer of Care (Signed)
Immediate Anesthesia Transfer of Care Note  Patient: David Christian  Procedure(s) Performed: Procedure(s) with comments: EXCISION ANTERIOR NECK & RIGHT SUBMANDIBULAR CYSTS WITH COMPLEX CLOSURES (Right) - CPAP  Patient Location: PACU  Anesthesia Type: General ETT  Level of Consciousness: awake, alert  and patient cooperative  Airway and Oxygen Therapy: Patient Spontanous Breathing and Patient connected to supplemental oxygen  Post-op Assessment: Post-op Vital signs reviewed, Patient's Cardiovascular Status Stable, Respiratory Function Stable, Patent Airway and No signs of Nausea or vomiting  Post-op Vital Signs: Reviewed and stable  Complications: No apparent anesthesia complications

## 2014-10-31 NOTE — Anesthesia Preprocedure Evaluation (Addendum)
Anesthesia Evaluation  Patient identified by MRN, date of birth, ID band  Reviewed: NPO status   History of Anesthesia Complications Negative for: history of anesthetic complications  Airway Mallampati: II  TM Distance: >3 FB Neck ROM: full    Dental no notable dental hx.    Pulmonary sleep apnea and Continuous Positive Airway Pressure Ventilation , former smoker,    Pulmonary exam normal       Cardiovascular Exercise Tolerance: Good hypertension, Normal cardiovascular exam    Neuro/Psych negative neurological ROS  negative psych ROS   GI/Hepatic negative GI ROS, Neg liver ROS,   Endo/Other  negative endocrine ROSBmi=35;  Renal/GU negative Renal ROS  negative genitourinary   Musculoskeletal   Abdominal   Peds  Hematology negative hematology ROS (+)   Anesthesia Other Findings Ekg: nsr;  Reproductive/Obstetrics                           Anesthesia Physical Anesthesia Plan  ASA: II  Anesthesia Plan: General ETT   Post-op Pain Management:    Induction:   Airway Management Planned:   Additional Equipment:   Intra-op Plan:   Post-operative Plan:   Informed Consent: I have reviewed the patients History and Physical, chart, labs and discussed the procedure including the risks, benefits and alternatives for the proposed anesthesia with the patient or authorized representative who has indicated his/her understanding and acceptance.     Plan Discussed with: CRNA  Anesthesia Plan Comments: (Had 4-6 oz water at 0620 > will delay case.  Cricoid pressure with RSI.)      Anesthesia Quick Evaluation

## 2014-11-01 ENCOUNTER — Encounter: Payer: Self-pay | Admitting: Otolaryngology

## 2014-11-04 LAB — SURGICAL PATHOLOGY

## 2015-02-20 ENCOUNTER — Other Ambulatory Visit: Payer: Self-pay | Admitting: Internal Medicine

## 2015-02-20 DIAGNOSIS — Z0000 Encounter for general adult medical examination without abnormal findings: Secondary | ICD-10-CM

## 2015-02-26 ENCOUNTER — Other Ambulatory Visit: Payer: Self-pay | Admitting: Internal Medicine

## 2015-03-04 ENCOUNTER — Other Ambulatory Visit (INDEPENDENT_AMBULATORY_CARE_PROVIDER_SITE_OTHER): Payer: PRIVATE HEALTH INSURANCE

## 2015-03-04 ENCOUNTER — Other Ambulatory Visit: Payer: Self-pay | Admitting: Internal Medicine

## 2015-03-04 DIAGNOSIS — Z0000 Encounter for general adult medical examination without abnormal findings: Secondary | ICD-10-CM

## 2015-03-04 DIAGNOSIS — R7989 Other specified abnormal findings of blood chemistry: Secondary | ICD-10-CM

## 2015-03-04 LAB — CBC
HEMATOCRIT: 46.6 % (ref 39.0–52.0)
Hemoglobin: 15.8 g/dL (ref 13.0–17.0)
MCHC: 33.9 g/dL (ref 30.0–36.0)
MCV: 91.2 fl (ref 78.0–100.0)
Platelets: 260 10*3/uL (ref 150.0–400.0)
RBC: 5.11 Mil/uL (ref 4.22–5.81)
RDW: 12.7 % (ref 11.5–15.5)
WBC: 7.4 10*3/uL (ref 4.0–10.5)

## 2015-03-04 LAB — COMPREHENSIVE METABOLIC PANEL
ALBUMIN: 3.9 g/dL (ref 3.5–5.2)
ALT: 20 U/L (ref 0–53)
AST: 23 U/L (ref 0–37)
Alkaline Phosphatase: 46 U/L (ref 39–117)
BUN: 16 mg/dL (ref 6–23)
CHLORIDE: 101 meq/L (ref 96–112)
CO2: 30 mEq/L (ref 19–32)
CREATININE: 1.04 mg/dL (ref 0.40–1.50)
Calcium: 9.3 mg/dL (ref 8.4–10.5)
GFR: 84.89 mL/min (ref >60.00)
GLUCOSE: 94 mg/dL (ref 70–99)
POTASSIUM: 4 meq/L (ref 3.5–5.1)
SODIUM: 139 meq/L (ref 135–145)
TOTAL PROTEIN: 6.7 g/dL (ref 6.0–8.3)
Total Bilirubin: 0.5 mg/dL (ref 0.2–1.2)

## 2015-03-04 LAB — LIPID PANEL
Cholesterol: 270 mg/dL — ABNORMAL HIGH (ref 0–200)
HDL: 40.8 mg/dL (ref >39.00)
NONHDL: 229.21
Total CHOL/HDL Ratio: 7
Triglycerides: 217 mg/dL — ABNORMAL HIGH (ref 0.0–149.0)
VLDL: 43.4 mg/dL — AB (ref 0.0–40.0)

## 2015-03-04 LAB — LDL CHOLESTEROL, DIRECT: LDL DIRECT: 182 mg/dL

## 2015-03-04 LAB — TSH: TSH: 1.14 u[IU]/mL (ref 0.35–4.50)

## 2015-03-04 LAB — HEMOGLOBIN A1C: HEMOGLOBIN A1C: 5.3 % (ref 4.6–6.5)

## 2015-03-05 ENCOUNTER — Ambulatory Visit (INDEPENDENT_AMBULATORY_CARE_PROVIDER_SITE_OTHER): Payer: PRIVATE HEALTH INSURANCE | Admitting: Internal Medicine

## 2015-03-05 ENCOUNTER — Encounter: Payer: Self-pay | Admitting: Internal Medicine

## 2015-03-05 VITALS — BP 134/86 | HR 96 | Temp 98.4°F | Ht 70.5 in | Wt 259.0 lb

## 2015-03-05 DIAGNOSIS — E782 Mixed hyperlipidemia: Secondary | ICD-10-CM | POA: Insufficient documentation

## 2015-03-05 DIAGNOSIS — E785 Hyperlipidemia, unspecified: Secondary | ICD-10-CM | POA: Diagnosis not present

## 2015-03-05 DIAGNOSIS — I10 Essential (primary) hypertension: Secondary | ICD-10-CM

## 2015-03-05 DIAGNOSIS — Z0000 Encounter for general adult medical examination without abnormal findings: Secondary | ICD-10-CM | POA: Diagnosis not present

## 2015-03-05 LAB — HIV ANTIBODY (ROUTINE TESTING W REFLEX): HIV: NONREACTIVE

## 2015-03-05 MED ORDER — LISINOPRIL-HYDROCHLOROTHIAZIDE 10-12.5 MG PO TABS: 1.0000 | ORAL_TABLET | Freq: Every day | ORAL | Status: DC

## 2015-03-05 MED ORDER — ATORVASTATIN CALCIUM 20 MG PO TABS: 20.0000 mg | ORAL_TABLET | Freq: Every day | ORAL | Status: DC

## 2015-03-05 NOTE — Assessment & Plan Note (Signed)
Change HCTZ to Lisinopril HCT Low fat, low salt diet Exercise to lose weight

## 2015-03-05 NOTE — Progress Notes (Signed)
Subjective:    Patient ID: David Christian, male    DOB: 1975/09/26, 39 y.o.   MRN: [id number]  HPI  Pt presents to the clinic today for his annual exam. He is also concerned about his BP. He has noticed that he it has been spiking up to 160/102. He is taking he HTCZ 25 mg daily.  Flu: 12/2014 Tetanus: < 10 years ago Dentist: as needed  Diet: He does eat meat. He consumes veggies daily. He eats fruits 2-3 times per week. He has been trying to avoid fried foods. He drinks mostly water. Exercise: He is using a bowflex a few days per week.  Review of Systems      Past Medical History  Diagnosis Date  . Sleep apnea     uses CPAP    Current Outpatient Prescriptions  Medication Sig Dispense Refill  . cetirizine (ZYRTEC) 10 MG tablet Take 10 mg by mouth daily.    . hydrochlorothiazide (HYDRODIURIL) 25 MG tablet TAKE 1 TABLET (25 MG TOTAL) BY MOUTH DAILY. 90 tablet 0  . hydrocortisone-pramoxine (ANALPRAM-HC) 2.5-1 % rectal cream APPLY 1 APPLICATION TOPICALLY 3 (THREE) TIMES DAILY. 30 g 0  . MAGNESIUM PO Take by mouth.    [name] POTASSIUM PO Take by mouth.     No current facility-administered medications for this visit.    No Known Allergies  Family History  Problem Relation Age of Onset  . Arthritis Maternal Grandfather   . Arthritis Paternal Grandmother   . Diabetes Neg Hx   . Heart disease Neg Hx   . Stroke Neg Hx     Social History   Social History  . Marital Status: Married    Spouse Name: N/A  . Number of Children: N/A  . Years of Education: N/A   Occupational History  . Not on file.   Social History Main Topics  . Smoking status: Former Smoker -- 6 years    Quit date: 08/07/1999  . Smokeless tobacco: Current User    Types: Chew  . Alcohol Use: 0.6 oz/week    1 Cans of beer per week     Comment: rare  . Drug Use: No  . Sexual Activity: Not on file   Other Topics Concern  . Not on file   Social History Narrative     Constitutional: Denies fever,  malaise, fatigue, headache or abrupt weight changes.  HEENT: Denies eye pain, eye redness, ear pain, ringing in the ears, wax buildup, runny nose, nasal congestion, bloody nose, or sore throat. Respiratory: Denies difficulty breathing, shortness of breath, cough or sputum production.   Cardiovascular: Denies chest pain, chest tightness, palpitations or swelling in the hands or feet.  Gastrointestinal: Denies abdominal pain, bloating, constipation, diarrhea or blood in the stool.  GU: Denies urgency, frequency, pain with urination, burning sensation, blood in urine, odor or discharge. Musculoskeletal: Denies decrease in range of motion, difficulty with gait, muscle pain or joint pain and swelling.  Skin: Denies redness, rashes, lesions or ulcercations.  Neurological: Denies dizziness, difficulty with memory, difficulty with speech or problems with balance and coordination.  Psych: Denies anxiety, depression, SI/HI.  No other specific complaints in a complete review of systems (except as listed in HPI above).  Objective:   Physical Exam   BP 134/86 mmHg  Pulse 96  Temp(Src) 98.4 F (36.9 C) (Oral)  Ht 5' 10.5" (1.791 m)  Wt 259 lb (117.482 kg)  BMI 36.63 kg/m2  SpO2 98% Wt Readings from Last 3  Encounters:  03/05/15 259 lb (117.482 kg)  10/31/14 247 lb (112.038 kg)  09/05/14 250 lb (113.399 kg)    General: Appears his stated age, obese in NAD. Skin: Warm, dry and intact. No rashes, lesions or ulcerations noted. HEENT: Head: normal shape and size; Eyes: sclera white, no icterus, conjunctiva pink, PERRLA and EOMs intact; Ears: Tm's gray and intact, normal light reflex; Throat/Mouth: Teeth present, mucosa pink and moist, no exudate, lesions or ulcerations noted.  Neck:  Neck supple, trachea midline. No masses, lumps or thyromegaly present.  Cardiovascular: Normal rate and rhythm. S1,S2 noted.  No murmur, rubs or gallops noted. No JVD or BLE edema. Pulmonary/Chest: Normal effort and  positive vesicular breath sounds. No respiratory distress. No wheezes, rales or ronchi noted.  Abdomen: Soft and nontender. Normal bowel sounds. No distention or masses noted. Liver, spleen and kidneys non palpable. Musculoskeletal: Strength 5/5 BUE/BLE. No signs of joint swelling. No difficulty with gait.  Neurological: Alert and oriented. Cranial nerves II-XII grossly intact. Coordination normal.  Psychiatric: Mood and affect normal. Behavior is normal. Judgment and thought content normal.     BMET    Component Value Date/Time   NA 139 03/04/2015 0825   K 4.0 03/04/2015 0825   CL 101 03/04/2015 0825   CO2 30 03/04/2015 0825   GLUCOSE 94 03/04/2015 0825   BUN 16 03/04/2015 0825   CREATININE 1.04 03/04/2015 0825   CALCIUM 9.3 03/04/2015 0825    Lipid Panel     Component Value Date/Time   CHOL 270* 03/04/2015 0825   TRIG 217.0* 03/04/2015 0825   HDL 40.80 03/04/2015 0825   CHOLHDL 7 03/04/2015 0825   VLDL 43.4* 03/04/2015 0825   LDLCALC 205* 05/23/2013 1416    CBC    Component Value Date/Time   WBC 7.4 03/04/2015 0825   RBC 5.11 03/04/2015 0825   HGB 15.8 03/04/2015 0825   HCT 46.6 03/04/2015 0825   PLT 260.0 03/04/2015 0825   MCV 91.2 03/04/2015 0825   MCHC 33.9 03/04/2015 0825   RDW 12.7 03/04/2015 0825    Hgb A1C Lab Results  Component Value Date   HGBA1C 5.3 03/04/2015        Assessment & Plan:   Preventative Health Maintenance:  Flu shot UTD He insists his Tetanus is UTD Encouraged him to see a dentist at least annually Encouraged him to consume a low fat diet and start an aerobic exercise program Labs drawn 1 week prior to appt reviewed  RTC in 1 month to follow up HTN

## 2015-03-05 NOTE — Assessment & Plan Note (Signed)
Will start Lipitor Encouraged him to consume a low fat diet

## 2015-03-05 NOTE — Progress Notes (Signed)
Pre visit review using our clinic review tool, if applicable. No additional management support is needed unless otherwise documented below in the visit note. 

## 2015-03-05 NOTE — Patient Instructions (Signed)

## 2015-04-07 ENCOUNTER — Ambulatory Visit (INDEPENDENT_AMBULATORY_CARE_PROVIDER_SITE_OTHER): Payer: PRIVATE HEALTH INSURANCE | Admitting: Internal Medicine

## 2015-04-07 ENCOUNTER — Encounter: Payer: Self-pay | Admitting: Internal Medicine

## 2015-04-07 VITALS — BP 126/84 | HR 106 | Temp 98.8°F | Wt 254.0 lb

## 2015-04-07 DIAGNOSIS — R42 Dizziness and giddiness: Secondary | ICD-10-CM

## 2015-04-07 DIAGNOSIS — I10 Essential (primary) hypertension: Secondary | ICD-10-CM | POA: Diagnosis not present

## 2015-04-07 MED ORDER — LISINOPRIL-HYDROCHLOROTHIAZIDE 10-12.5 MG PO TABS: 1.0000 | ORAL_TABLET | Freq: Every day | ORAL | Status: DC

## 2015-04-07 NOTE — Progress Notes (Signed)
Subjective:    Patient ID: David Christian, male    DOB: 1975/07/25, 40 y.o.   MRN: [id number]  HPI  Pt presents today for 1 month follow up of HTN. He was seen on 03/05/15 where his BP was 134/86. He noted his BP had been spiking up to 160/102. He was taking HCTZ  daily and at that visit was switched to Lisinopril-HCTZ 10-12.5mg  daily. BP today is 126/84. Takes his blood pressure every now and then, high is 140/100. Pt says he feels lightheaded, feels like his "brain goes numb". He says he feels dizzy and that he might pass out and nauseous. States he drinks 5-6 bottles of water a day. Denies headache, chest pain or shortness of breath.   Review of Systems  Past Medical History  Diagnosis Date  . Sleep apnea     uses CPAP    Current Outpatient Prescriptions  Medication Sig Dispense Refill  . atorvastatin (LIPITOR) 20 MG tablet Take 1 tablet (20 mg total) by mouth daily. 30 tablet 2  . cetirizine (ZYRTEC) 10 MG tablet Take 10 mg by mouth daily.    . hydrochlorothiazide (HYDRODIURIL) 25 MG tablet TAKE 1 TABLET (25 MG TOTAL) BY MOUTH DAILY. 90 tablet 0  . hydrocortisone-pramoxine (ANALPRAM-HC) 2.5-1 % rectal cream APPLY 1 APPLICATION TOPICALLY 3 (THREE) TIMES DAILY. 30 g 0  . lisinopril-hydrochlorothiazide (PRINZIDE,ZESTORETIC) 10-12.5 MG tablet Take 1 tablet by mouth daily. 30 tablet 1  . MAGNESIUM PO Take by mouth.    [name] POTASSIUM PO Take by mouth.     No current facility-administered medications for this visit.    No Known Allergies  Family History  Problem Relation Age of Onset  . Arthritis Maternal Grandfather   . Arthritis Paternal Grandmother   . Diabetes Neg Hx   . Heart disease Neg Hx   . Stroke Neg Hx     Social History   Social History  . Marital Status: Married    Spouse Name: N/A  . Number of Children: N/A  . Years of Education: N/A   Occupational History  . Not on file.   Social History Main Topics  . Smoking status: Former Smoker -- 6 years   Quit date: 08/07/1999  . Smokeless tobacco: Current User    Types: Chew  . Alcohol Use: 0.6 oz/week    1 Cans of beer per week     Comment: rare  . Drug Use: No  . Sexual Activity: Not on file   Other Topics Concern  . Not on file   Social History Narrative     Constitutional: Denies fatigue or headache. Respiratory: Denies difficulty breathing, shortness of breath, cough or sputum production.   Cardiovascular: Denies chest pain, chest tightness, palpitations or swelling in the hands or feet.  Gastrointestinal: Denies abdominal pain, constipation, diarrhea or blood in the stool.  Musculoskeletal: Denies myalgias.  Neurological: Positive for dizziness.   No other specific complaints in a complete review of systems (except as listed in HPI above).     Objective:   Physical Exam  BP 126/84 mmHg  Pulse 106  Temp(Src) 98.8 F (37.1 C) (Oral)  Wt 254 lb (115.214 kg)  SpO2 98% Wt Readings from Last 3 Encounters:  04/07/15 254 lb (115.214 kg)  03/05/15 259 lb (117.482 kg)  10/31/14 247 lb (112.038 kg)    General: Appears his stated age, obese in NAD. Skin: Warm, dry and intact. No rashes, lesions or ulcerations noted. HEENT: Head: normal shape and size;  Eyes: sclera white, no icterus, conjunctiva pink, Ears: Tm's gray and intact, normal light reflex; Nose: mucosa pink and moist, septum midline. Neck:  Anterior cervical lymphadenopathy noted. Will continue to monitor.  Cardiovascular: Normal rate and rhythm. S1,S2 noted.  No murmur, rubs or gallops noted.  Pulmonary/Chest: Normal effort and positive vesicular breath sounds. No respiratory distress. No wheezes, rales or ronchi noted.  Neuro: Alert and oriented.  BMET    Component Value Date/Time   NA 139 03/04/2015 0825   K 4.0 03/04/2015 0825   CL 101 03/04/2015 0825   CO2 30 03/04/2015 0825   GLUCOSE 94 03/04/2015 0825   BUN 16 03/04/2015 0825   CREATININE 1.04 03/04/2015 0825   CALCIUM 9.3 03/04/2015 0825     Lipid Panel     Component Value Date/Time   CHOL 270* 03/04/2015 0825   TRIG 217.0* 03/04/2015 0825   HDL 40.80 03/04/2015 0825   CHOLHDL 7 03/04/2015 0825   VLDL 43.4* 03/04/2015 0825   LDLCALC 205* 05/23/2013 1416    CBC    Component Value Date/Time   WBC 7.4 03/04/2015 0825   RBC 5.11 03/04/2015 0825   HGB 15.8 03/04/2015 0825   HCT 46.6 03/04/2015 0825   PLT 260.0 03/04/2015 0825   MCV 91.2 03/04/2015 0825   MCHC 33.9 03/04/2015 0825   RDW 12.7 03/04/2015 0825    Hgb A1C Lab Results  Component Value Date   HGBA1C 5.3 03/04/2015          Assessment & Plan:  HTN:  Continue Lisinopril- HCTZ 10-12.5mg  daily  Dizziness:  Make an appt for an eye exam Vision screening, if normal discuss referral to neurology   RTC in 6 month for follow up.

## 2015-04-07 NOTE — Progress Notes (Signed)
Pre visit review using our clinic review tool, if applicable. No additional management support is needed unless otherwise documented below in the visit note. 

## 2015-04-08 LAB — COMPREHENSIVE METABOLIC PANEL
ALBUMIN: 4.3 g/dL (ref 3.5–5.2)
ALK PHOS: 52 U/L (ref 39–117)
ALT: 18 U/L (ref 0–53)
AST: 20 U/L (ref 0–37)
BILIRUBIN TOTAL: 0.5 mg/dL (ref 0.2–1.2)
BUN: 13 mg/dL (ref 6–23)
CO2: 28 mEq/L (ref 19–32)
Calcium: 9.5 mg/dL (ref 8.4–10.5)
Chloride: 99 mEq/L (ref 96–112)
Creatinine, Ser: 1.15 mg/dL (ref 0.40–1.50)
GFR: 75.56 mL/min (ref >60.00)
GLUCOSE: 78 mg/dL (ref 70–99)
Potassium: 4.2 mEq/L (ref 3.5–5.1)
Sodium: 136 mEq/L (ref 135–145)
TOTAL PROTEIN: 7.4 g/dL (ref 6.0–8.3)

## 2015-04-08 NOTE — Progress Notes (Signed)
Subjective:    Patient ID: David Christian, male    DOB: 1975/06/11, 40 y.o.   MRN: [id number]  HPI  Pt presents today for 1 month follow up of HTN. He was seen on 03/05/15 where his BP was 134/86. He noted his BP had been spiking up to 160/102. He was taking HCTZ  daily and at that visit was switched to Lisinopril-HCTZ 10-12.5mg  daily. BP today is 126/84. Takes his blood pressure every now and then, high is 140/100. Pt says he feels lightheaded, feels like his "brain goes numb". He says he feels dizzy and that he might pass out and nauseous. States he drinks 5-6 bottles of water a day. Denies headache, chest pain or shortness of breath.   Review of Systems  Past Medical History  Diagnosis Date  . Sleep apnea     uses CPAP    Current Outpatient Prescriptions  Medication Sig Dispense Refill  . atorvastatin (LIPITOR) 20 MG tablet Take 1 tablet (20 mg total) by mouth daily. 30 tablet 2  . cetirizine (ZYRTEC) 10 MG tablet Take 10 mg by mouth daily.    . hydrochlorothiazide (HYDRODIURIL) 25 MG tablet TAKE 1 TABLET (25 MG TOTAL) BY MOUTH DAILY. 90 tablet 0  . hydrocortisone-pramoxine (ANALPRAM-HC) 2.5-1 % rectal cream APPLY 1 APPLICATION TOPICALLY 3 (THREE) TIMES DAILY. 30 g 0  . lisinopril-hydrochlorothiazide (PRINZIDE,ZESTORETIC) 10-12.5 MG tablet Take 1 tablet by mouth daily. 30 tablet 5  . MAGNESIUM PO Take by mouth.    [name] POTASSIUM PO Take by mouth.     No current facility-administered medications for this visit.    No Known Allergies  Family History  Problem Relation Age of Onset  . Arthritis Maternal Grandfather   . Arthritis Paternal Grandmother   . Diabetes Neg Hx   . Heart disease Neg Hx   . Stroke Neg Hx     Social History   Social History  . Marital Status: Married    Spouse Name: N/A  . Number of Children: N/A  . Years of Education: N/A   Occupational History  . Not on file.   Social History Main Topics  . Smoking status: Former Smoker -- 6 years   Quit date: 08/07/1999  . Smokeless tobacco: Current User    Types: Chew  . Alcohol Use: 0.6 oz/week    1 Cans of beer per week     Comment: rare  . Drug Use: No  . Sexual Activity: Not on file   Other Topics Concern  . Not on file   Social History Narrative     Constitutional: Denies fatigue or headache. Respiratory: Denies difficulty breathing, shortness of breath, cough or sputum production.   Cardiovascular: Denies chest pain, chest tightness, palpitations or swelling in the hands or feet.  Gastrointestinal: Denies abdominal pain, constipation, diarrhea or blood in the stool.  Musculoskeletal: Denies myalgias.  Neurological: Positive for dizziness.   No other specific complaints in a complete review of systems (except as listed in HPI above).     Objective:   Physical Exam  BP 126/84 mmHg  Pulse 106  Temp(Src) 98.8 F (37.1 C) (Oral)  Wt 254 lb (115.214 kg)  SpO2 98% Wt Readings from Last 3 Encounters:  04/07/15 254 lb (115.214 kg)  03/05/15 259 lb (117.482 kg)  10/31/14 247 lb (112.038 kg)    General: Appears his stated age, obese in NAD. Skin: Warm, dry and intact. No rashes, lesions or ulcerations noted. HEENT: Head: normal shape and size;  Eyes: sclera white, no icterus, conjunctiva pink, Ears: Tm's gray and intact, normal light reflex; Nose: mucosa pink and moist, septum midline. Neck:  Anterior cervical lymphadenopathy noted. Will continue to monitor.  Cardiovascular: Normal rate and rhythm. S1,S2 noted.  No murmur, rubs or gallops noted.  Pulmonary/Chest: Normal effort and positive vesicular breath sounds. No respiratory distress. No wheezes, rales or ronchi noted.  Neuro: Alert and oriented.  BMET    Component Value Date/Time   NA 139 03/04/2015 0825   K 4.0 03/04/2015 0825   CL 101 03/04/2015 0825   CO2 30 03/04/2015 0825   GLUCOSE 94 03/04/2015 0825   BUN 16 03/04/2015 0825   CREATININE 1.04 03/04/2015 0825   CALCIUM 9.3 03/04/2015 0825     Lipid Panel     Component Value Date/Time   CHOL 270* 03/04/2015 0825   TRIG 217.0* 03/04/2015 0825   HDL 40.80 03/04/2015 0825   CHOLHDL 7 03/04/2015 0825   VLDL 43.4* 03/04/2015 0825   LDLCALC 205* 05/23/2013 1416    CBC    Component Value Date/Time   WBC 7.4 03/04/2015 0825   RBC 5.11 03/04/2015 0825   HGB 15.8 03/04/2015 0825   HCT 46.6 03/04/2015 0825   PLT 260.0 03/04/2015 0825   MCV 91.2 03/04/2015 0825   MCHC 33.9 03/04/2015 0825   RDW 12.7 03/04/2015 0825    Hgb A1C Lab Results  Component Value Date   HGBA1C 5.3 03/04/2015          Assessment & Plan:  HTN:  Continue Lisinopril- HCTZ 10-12.5mg  daily  Dizziness:  Make an appt for an eye exam Vision screening, if normal discuss referral to neurology   RTC in 6 month for follow up.

## 2015-04-08 NOTE — Addendum Note (Signed)
Addended by: Lorre Munroe on: 04/08/2015 08:01 AM   Modules accepted: Kipp Brood

## 2015-04-08 NOTE — Patient Instructions (Signed)
Hypertension Hypertension, commonly called high blood pressure, is when the force of blood pumping through your arteries is too strong. Your arteries are the blood vessels that carry blood from your heart throughout your body. A blood pressure reading consists of a higher number over a lower number, such as 110/72. The higher number (systolic) is the pressure inside your arteries when your heart pumps. The lower number (diastolic) is the pressure inside your arteries when your heart relaxes. Ideally you want your blood pressure below 120/80. Hypertension forces your heart to work harder to pump blood. Your arteries may become narrow or stiff. Having untreated or uncontrolled hypertension can cause heart attack, stroke, kidney disease, and other problems. RISK FACTORS Some risk factors for high blood pressure are controllable. Others are not.  Risk factors you cannot control include:   Race. You may be at higher risk if you are African American.  Age. Risk increases with age.  Gender. Men are at higher risk than women before age 45 years. After age 65, women are at higher risk than men. Risk factors you can control include:  Not getting enough exercise or physical activity.  Being overweight.  Getting too much fat, sugar, calories, or salt in your diet.  Drinking too much alcohol. SIGNS AND SYMPTOMS Hypertension does not usually cause signs or symptoms. Extremely high blood pressure (hypertensive crisis) may cause headache, anxiety, shortness of breath, and nosebleed. DIAGNOSIS To check if you have hypertension, your health care provider will measure your blood pressure while you are seated, with your arm held at the level of your heart. It should be measured at least twice using the same arm. Certain conditions can cause a difference in blood pressure between your right and left arms. A blood pressure reading that is higher than normal on one occasion does not mean that you need treatment. If  it is not clear whether you have high blood pressure, you may be asked to return on a different day to have your blood pressure checked again. Or, you may be asked to monitor your blood pressure at home for 1 or more weeks. TREATMENT Treating high blood pressure includes making lifestyle changes and possibly taking medicine. Living a healthy lifestyle can help lower high blood pressure. You may need to change some of your habits. Lifestyle changes may include:  Following the DASH diet. This diet is high in fruits, vegetables, and whole grains. It is low in salt, red meat, and added sugars.  Keep your sodium intake below 2,300 mg per day.  Getting at least 30-45 minutes of aerobic exercise at least 4 times per week.  Losing weight if necessary.  Not smoking.  Limiting alcoholic beverages.  Learning ways to reduce stress. Your health care provider may prescribe medicine if lifestyle changes are not enough to get your blood pressure under control, and if one of the following is true:  You are 18-59 years of age and your systolic blood pressure is above 140.  You are 60 years of age or older, and your systolic blood pressure is above 150.  Your diastolic blood pressure is above 90.  You have diabetes, and your systolic blood pressure is over 140 or your diastolic blood pressure is over 90.  You have kidney disease and your blood pressure is above 140/90.  You have heart disease and your blood pressure is above 140/90. Your personal target blood pressure may vary depending on your medical conditions, your age, and other factors. HOME CARE INSTRUCTIONS    Have your blood pressure rechecked as directed by your health care provider.   Take medicines only as directed by your health care provider. Follow the directions carefully. Blood pressure medicines must be taken as prescribed. The medicine does not work as well when you skip doses. Skipping doses also puts you at risk for  problems.  Do not smoke.   Monitor your blood pressure at home as directed by your health care provider. SEEK MEDICAL CARE IF:   You think you are having a reaction to medicines taken.  You have recurrent headaches or feel dizzy.  You have swelling in your ankles.  You have trouble with your vision. SEEK IMMEDIATE MEDICAL CARE IF:  You develop a severe headache or confusion.  You have unusual weakness, numbness, or feel faint.  You have severe chest or abdominal pain.  You vomit repeatedly.  You have trouble breathing. MAKE SURE YOU:   Understand these instructions.  Will watch your condition.  Will get help right away if you are not doing well or get worse.   This information is not intended to replace advice given to you by your health care provider. Make sure you discuss any questions you have with your health care provider.   Document Released: 02/22/2005 Document Revised: 07/09/2014 Document Reviewed: 12/15/2012 Elsevier Interactive Patient Education 2016 [employer]  

## 2015-05-22 ENCOUNTER — Other Ambulatory Visit: Payer: Self-pay | Admitting: Internal Medicine

## 2015-05-22 ENCOUNTER — Other Ambulatory Visit: Payer: Self-pay

## 2015-05-22 DIAGNOSIS — I10 Essential (primary) hypertension: Secondary | ICD-10-CM

## 2015-05-22 DIAGNOSIS — E785 Hyperlipidemia, unspecified: Secondary | ICD-10-CM

## 2015-05-22 MED ORDER — ATORVASTATIN CALCIUM 20 MG PO TABS: 20.0000 mg | ORAL_TABLET | Freq: Every day | ORAL | Status: DC

## 2015-05-22 MED ORDER — LISINOPRIL-HYDROCHLOROTHIAZIDE 10-12.5 MG PO TABS: 1.0000 | ORAL_TABLET | Freq: Every day | ORAL | Status: DC

## 2015-05-22 NOTE — Telephone Encounter (Signed)
Rx sent electronically.  Left message to call and schedule a fasting lab appointment for a lipid panel. The future order has been placed

## 2015-05-22 NOTE — Telephone Encounter (Signed)
[facility] left voicemail requesting Rx be changed to 90 day supply for insurance reasons, done

## 2015-05-29 ENCOUNTER — Other Ambulatory Visit: Payer: Self-pay | Admitting: Internal Medicine

## 2015-06-01 ENCOUNTER — Other Ambulatory Visit: Payer: Self-pay | Admitting: Internal Medicine

## 2015-06-01 DIAGNOSIS — I10 Essential (primary) hypertension: Secondary | ICD-10-CM

## 2015-06-02 MED ORDER — ATORVASTATIN CALCIUM 20 MG PO TABS: 20.0000 mg | ORAL_TABLET | Freq: Every day | ORAL | Status: DC

## 2015-06-02 MED ORDER — LISINOPRIL-HYDROCHLOROTHIAZIDE 10-12.5 MG PO TABS: 1.0000 | ORAL_TABLET | Freq: Every day | ORAL | Status: DC

## 2015-06-02 MED ORDER — HYDROCORTISONE ACE-PRAMOXINE 2.5-1 % RE CREA: TOPICAL_CREAM | Freq: Three times a day (TID) | RECTAL | Status: DC

## 2015-06-02 NOTE — Addendum Note (Signed)
Addended by: Roena MaladyEVONTENNO, Evon Lopezperez Y on: 06/02/2015 09:04 AM   Modules accepted: Orders

## 2015-11-12 ENCOUNTER — Other Ambulatory Visit: Payer: Self-pay | Admitting: Internal Medicine

## 2015-11-12 DIAGNOSIS — I10 Essential (primary) hypertension: Secondary | ICD-10-CM

## 2015-11-13 ENCOUNTER — Other Ambulatory Visit: Payer: Self-pay | Admitting: Internal Medicine

## 2015-11-13 DIAGNOSIS — I10 Essential (primary) hypertension: Secondary | ICD-10-CM

## 2015-11-13 MED ORDER — LISINOPRIL-HYDROCHLOROTHIAZIDE 10-12.5 MG PO TABS: 1.0000 | ORAL_TABLET | Freq: Every day | ORAL | 0 refills | Status: DC

## 2015-11-13 NOTE — Addendum Note (Signed)
Addended by: Roena MaladyEVONTENNO, Binyomin Brann Y on: 11/13/2015 05:14 PM   Modules accepted: Orders

## 2015-11-13 NOTE — Telephone Encounter (Signed)
Pt left v/m requesting status of atorvastatin and lisinopril-HCTZ. Last annual 03/05/15; refilled per protocol and pt to cb for appt.

## 2016-02-07 ENCOUNTER — Other Ambulatory Visit: Payer: Self-pay | Admitting: Internal Medicine

## 2016-02-07 DIAGNOSIS — I10 Essential (primary) hypertension: Secondary | ICD-10-CM

## 2018-04-24 ENCOUNTER — Other Ambulatory Visit (HOSPITAL_COMMUNITY): Payer: Self-pay | Admitting: Specialist

## 2018-04-24 ENCOUNTER — Encounter: Payer: Self-pay | Admitting: Cardiology

## 2018-04-24 ENCOUNTER — Other Ambulatory Visit: Payer: Self-pay | Admitting: Specialist

## 2018-04-24 DIAGNOSIS — R079 Chest pain, unspecified: Secondary | ICD-10-CM

## 2018-04-26 ENCOUNTER — Encounter: Payer: Self-pay | Admitting: Cardiology

## 2018-04-26 ENCOUNTER — Telehealth (HOSPITAL_COMMUNITY): Payer: Self-pay

## 2018-04-26 NOTE — Telephone Encounter (Signed)
Encounter complete. 

## 2018-04-28 ENCOUNTER — Encounter

## 2018-04-28 ENCOUNTER — Ambulatory Visit (INDEPENDENT_AMBULATORY_CARE_PROVIDER_SITE_OTHER): Payer: Managed Care, Other (non HMO) | Admitting: Cardiovascular Disease

## 2018-04-28 ENCOUNTER — Ambulatory Visit (HOSPITAL_COMMUNITY)
Admission: RE | Admit: 2018-04-28 | Discharge: 2018-04-28 | Disposition: A | Discharge status: Home / Self Care | Payer: Managed Care, Other (non HMO) | Source: Ambulatory Visit | Attending: Cardiovascular Disease | Admitting: Cardiovascular Disease

## 2018-04-28 ENCOUNTER — Encounter: Payer: Self-pay | Admitting: Cardiovascular Disease

## 2018-04-28 ENCOUNTER — Encounter (HOSPITAL_COMMUNITY): Payer: Self-pay

## 2018-04-28 ENCOUNTER — Encounter: Payer: Self-pay | Admitting: *Deleted

## 2018-04-28 VITALS — BP 113/79 | HR 74 | Ht 71.0 in | Wt 254.0 lb

## 2018-04-28 DIAGNOSIS — I10 Essential (primary) hypertension: Secondary | ICD-10-CM

## 2018-04-28 DIAGNOSIS — I25118 Atherosclerotic heart disease of native coronary artery with other forms of angina pectoris: Secondary | ICD-10-CM

## 2018-04-28 DIAGNOSIS — R9439 Abnormal result of other cardiovascular function study: Secondary | ICD-10-CM

## 2018-04-28 DIAGNOSIS — G4733 Obstructive sleep apnea (adult) (pediatric): Secondary | ICD-10-CM

## 2018-04-28 DIAGNOSIS — Z6835 Body mass index (BMI) 35.0-35.9, adult: Secondary | ICD-10-CM

## 2018-04-28 DIAGNOSIS — R079 Chest pain, unspecified: Secondary | ICD-10-CM | POA: Insufficient documentation

## 2018-04-28 DIAGNOSIS — Z9989 Dependence on other enabling machines and devices: Secondary | ICD-10-CM

## 2018-04-28 DIAGNOSIS — E782 Mixed hyperlipidemia: Secondary | ICD-10-CM

## 2018-04-28 DIAGNOSIS — I2510 Atherosclerotic heart disease of native coronary artery without angina pectoris: Secondary | ICD-10-CM | POA: Insufficient documentation

## 2018-04-28 LAB — MYOCARDIAL PERFUSION IMAGING
CHL CUP NUCLEAR SRS: 0
CSEPPHR: 120 {beats}/min
LVDIAVOL: 131 mL (ref 62–150)
LVSYSVOL: 68 mL
Rest HR: 74 {beats}/min
SDS: 12
SSS: 12
TID: 1.3

## 2018-04-28 MED ORDER — METOPROLOL SUCCINATE ER 25 MG PO TB24: 25.0000 mg | ORAL_TABLET | Freq: Every day | ORAL | 3 refills | Status: DC

## 2018-04-28 MED ORDER — TECHNETIUM TC 99M TETROFOSMIN IV KIT
30.6000 | PACK | Freq: Once | INTRAVENOUS | Status: AC | PRN
  Administered 2018-04-28: 30.6 via INTRAVENOUS
  Filled 2018-04-28: qty 31

## 2018-04-28 MED ORDER — TECHNETIUM TC 99M TETROFOSMIN IV KIT
10.7000 | PACK | Freq: Once | INTRAVENOUS | Status: AC | PRN
  Administered 2018-04-28: 10.7 via INTRAVENOUS
  Filled 2018-04-28: qty 11

## 2018-04-28 MED ORDER — REGADENOSON 0.4 MG/5ML IV SOLN
0.4000 mg | Freq: Once | INTRAVENOUS | Status: AC
  Administered 2018-04-28: 0.4 mg via INTRAVENOUS

## 2018-04-28 NOTE — H&P (View-Only) (Signed)
Cardiology Office Note:    Date:  04/28/2018   ID:  David Christian, DOB 01/19/1976, MRN [id number]  PCP:  Slatosky, John J., MD  Cardiologist:  No primary care provider on file.  Electrophysiologist:  None   Referring MD: Baity, Regina W, NP   Chief Complaint  Patient presents with  . Chest Pain  Abnormal nuclear stress test  History of Present Illness:    David Christian is a 43 y.o. male with a hx of obesity, hyperlipidemia, hypertension, obstructive sleep apnea on CPAP, remote smoker who has had a couple of years of exertional chest pain.  He has been told that he may have asthma and/or esophageal reflux.  Bronchodilators did not offer any benefit.  Proton pump inhibitor therapy did not help either.  He was referred for a nuclear stress test today, due to persistent symptoms.  He has CCS class II/NYHA class II symptoms with typical chest tightness in the retrosternal area and shortness of breath when walking up an incline or faster than usual.  He has never had the same type of chest tightness at rest, but he has a different type of heartburn sensation that may actually be acid reflux.  He believes that he is able to distinguish the two different chest pain syndromes.  He had a Lexiscan Myoview study.  The nuclear images show a very large area of reversible ischemia matching the typical distribution of the proximal-mid LAD artery including the apex, septum and most of the anterior wall.  The basal two thirds of the anterior wall are spared suggesting that he lateral ramus intermedius artery.  He quit smoking about 7 years ago after smoking roughly a pack a day for about 15 years.  Still uses tobacco dip.  He has severely elevated LDL cholesterol and now takes both atorvastatin and ezetimibe.  Recent lipid profile is not available for review.  He has well treated systemic hypertension and is 100% compliant with CPAP for obstructive sleep apnea.  He denies daytime hypersomnolence.  He does  not have diabetes and does not have a family history of premature coronary disease.  Both of his parents are still alive and healthy. Past Medical History:  Diagnosis Date  . Hyperlipidemia   . Hypertension   . Sleep apnea    uses CPAP    Past Surgical History:  Procedure Laterality Date  . ELBOW SURGERY    . HAND SURGERY    . MASS EXCISION Right 10/31/2014   Procedure: EXCISION ANTERIOR NECK & RIGHT SUBMANDIBULAR CYSTS WITH COMPLEX CLOSURES;  Surgeon: Paul Juengel, MD;  Location: [facility];  Service: ENT;  Laterality: Right;  CPAP    Current Medications: Current Meds  Medication Sig  . aspirin EC 81 MG tablet Take 81 mg by mouth daily.  . atorvastatin (LIPITOR) 40 MG tablet Take 40 mg by mouth every evening.   . ezetimibe (ZETIA) 10 MG tablet Take 10 mg by mouth daily with lunch.   . lisinopril-hydrochlorothiazide (PRINZIDE,ZESTORETIC) 10-12.5 MG tablet Take 1 tablet by mouth daily. MUST SCHEDULE ANNUAL EXAM FOR FURTHER REFILLS  . metoprolol succinate (TOPROL XL) 25 MG 24 hr tablet Take 1 tablet (25 mg total) by mouth daily.  . [DISCONTINUED] metoprolol succinate (TOPROL XL) 25 MG 24 hr tablet Take 1 tablet (25 mg total) by mouth daily.     Allergies:   Patient has no known allergies.   Social History   Socioeconomic History  . Marital status: Married      Spouse name: Not on file  . Number of children: Not on file  . Years of education: Not on file  . Highest education level: Not on file  Occupational History  . Not on file  Social Needs  . Financial resource strain: Not on file  . Food insecurity:    Worry: Not on file    Inability: Not on file  . Transportation needs:    Medical: Not on file    Non-medical: Not on file  Tobacco Use  . Smoking status: Former Smoker    Years: 6.00    Last attempt to quit: 08/07/1999    Years since quitting: 18.7  . Smokeless tobacco: Current User    Types: Chew  Substance and Sexual Activity  . Alcohol use: Yes     Alcohol/week: 1.0 standard drinks    Types: 1 Cans of beer per week    Comment: rare  . Drug use: No  . Sexual activity: Not on file  Lifestyle  . Physical activity:    Days per week: Not on file    Minutes per session: Not on file  . Stress: Not on file  Relationships  . Social connections:    Talks on phone: Not on file    Gets together: Not on file    Attends religious service: Not on file    Active member of club or organization: Not on file    Attends meetings of clubs or organizations: Not on file    Relationship status: Not on file  Other Topics Concern  . Not on file  Social History Narrative  . Not on file     Family History: The patient's family history includes Arthritis in his maternal grandfather and paternal grandmother. There is no history of Diabetes, Heart disease, or Stroke.  ROS:   Please see the history of present illness.     All other systems reviewed and are negative.  EKGs/Labs/Other Studies Reviewed:    The following studies were reviewed today: Nuclear stress test ECG and images  EKG:  EKG is  ordered today.  The ekg ordered today demonstrates sinus rhythm without ischemic repolarization abnormalities  Recent Labs: No results found for requested labs within last 8760 hours.  Recent Lipid Panel    Component Value Date/Time   CHOL 270 (H) 03/04/2015 0825   TRIG 217.0 (H) 03/04/2015 0825   HDL 40.80 03/04/2015 0825   CHOLHDL 7 03/04/2015 0825   VLDL 43.4 (H) 03/04/2015 0825   LDLCALC 205 (H) 05/23/2013 1416   LDLDIRECT 182.0 03/04/2015 0825    Physical Exam:    VS:  BP 113/79   Pulse 74   Ht 5' 11" (1.803 m)   Wt 254 lb (115.2 kg)   BMI 35.43 kg/m     Wt Readings from Last 3 Encounters:  04/28/18 254 lb (115.2 kg)  04/28/18 254 lb (115.2 kg)  04/07/15 254 lb (115.2 kg)     GEN: Obese,  Well nourished, well developed in no acute distress HEENT: Normal NECK: No JVD; No carotid bruits LYMPHATICS: No lymphadenopathy CARDIAC:  RRR, no murmurs, rubs, S4 gallop is present RESPIRATORY:  Clear to auscultation without rales, wheezing or rhonchi  ABDOMEN: Soft, non-tender, non-distended MUSCULOSKELETAL:  No edema; No deformity  SKIN: Warm and dry NEUROLOGIC:  Alert and oriented x 3 PSYCHIATRIC:  Normal affect   ASSESSMENT:    1. Coronary artery disease of native artery of native heart with stable angina pectoris (HCC)   2.   Mixed hyperlipidemia   3. Essential hypertension   4. OSA on CPAP   5. Severe obesity (BMI 35.0-35.9 with comorbidity) (HCC)   6. Abnormal nuclear stress test    PLAN:    In order of problems listed above:  1. CAD: Matt describes a very typical pattern of stable angina pectoris.  I suspect that his exertional dyspnea is also a consequence of coronary artery disease.  He seems to have extensive ischemia in the territory of the LAD artery.  He should undergo cardiac catheterization with coronary angiography and if necessary percutaneous plasty/stent placement.  We discussed the pros and cons and potential complications of both diagnostic will go ahead with it.  He is scheduled on Wednesday with Dr. Harding, who had a brief opportunity to meet with him in the clinic today.  If he develops symptoms at rest that lasts for more than 20-30 minutes in the interim, he should go to the emergency room.  He has already started taking aspirin 81 mg daily several months ago.  We will start a low-dose beta-blocker today. 2. HLP: We will retrieve his most recent lipid profile.  Discussed the fact that he treated to a target LDL cholesterol of no more than 78 he is already on a potent statin and on ezetimibe.  We discussed healthy changes to his diet exercise and the importance of weight loss in the long-term. 3. HTN: Well treated on the current medications.  Adding a beta-blocker.  May have to curtail use of the ACE inhibitor or diuretic in the future.  He should not take the lisinopril-hydrochlorothiazide on the day  of his cardiac catheterization. 4. OSA: Reports 100% compliance with therapy and good results symptomatically with CPAP. 5. Obesity: Once his coronary status is clarified and treated, routine physical exercise (in addition to a diet that is calorie restricted and low in carbohydrates with low glycemic index and low in saturated fats) will be important to achieve significant weight loss.   Medication Adjustments/Labs and Tests Ordered: Current medicines are reviewed at length with the patient today.  Concerns regarding medicines are outlined above.  Orders Placed This Encounter  Procedures  . Basic metabolic panel  . CBC   Meds ordered this encounter  Medications  . DISCONTD: metoprolol succinate (TOPROL XL) 25 MG 24 hr tablet    Sig: Take 1 tablet (25 mg total) by mouth daily.    Dispense:  90 tablet    Refill:  3  . metoprolol succinate (TOPROL XL) 25 MG 24 hr tablet    Sig: Take 1 tablet (25 mg total) by mouth daily.    Dispense:  90 tablet    Refill:  3    Patient Instructions     [facility] [address] [place] [address] Dept: [phone] Loc: [phone]  Gailen D Gadsden  04/28/2018  You are scheduled for a Cardiac Catheterization on Wednesday, February 26 with Dr. David Harding.  1. Please arrive at the North Tower (Main Entrance A) at [facility]: [address] [place], [place] [address] at 11:00 AM (This time is two hours before your procedure to ensure your preparation). Free valet parking service is available.   Special note: Every effort is made to have your procedure done on time. Please understand that emergencies sometimes delay scheduled procedures.  2. Diet: Do not eat solid foods after midnight.  The patient may have clear liquids until 5am upon the   day of the procedure.  3. Labs: You will need to have blood drawn today  4. Medication instructions in  preparation for your procedure:  START METOPROLOL SUCC ER 25 MG ONCE DAILY   On the morning of your procedure, take your Aspirin and any morning medicines.  You may use sips of water.  5. Plan for one night stay--bring personal belongings. 6. Bring a current list of your medications and current insurance cards. 7. You MUST have a responsible person to drive you home. 8. Someone MUST be with you the first 24 hours after you arrive home or your discharge will be delayed. 9. Please wear clothes that are easy to get on and off and wear slip-on shoes.  Thank you for allowing us to care for you!   -- [place] [facility]     Signed, Zaim Nitta, MD  04/28/2018 5:31 PM    [facility]  

## 2018-04-28 NOTE — Progress Notes (Signed)
Cardiology Office Note:    Date:  04/28/2018   ID:  David Christian, DOB 1975/08/13, MRN [id number]  PCP:  Nonnie Done., MD  Cardiologist:  No primary care provider on file.  Electrophysiologist:  None   Referring MD: David Munroe, NP   Chief Complaint  Patient presents with  . Chest Pain  Abnormal nuclear stress test  History of Present Illness:    David Christian is a 43 y.o. male with a hx of obesity, hyperlipidemia, hypertension, obstructive sleep apnea on CPAP, remote smoker who has had a couple of years of exertional chest pain.  He has been told that he may have asthma and/or esophageal reflux.  Bronchodilators did not offer any benefit.  Proton pump inhibitor therapy did not help either.  He was referred for a nuclear stress test today, due to persistent symptoms.  He has CCS class II/NYHA class II symptoms with typical chest tightness in the retrosternal area and shortness of breath when walking up an incline or faster than usual.  He has never had the same type of chest tightness at rest, but he has a different type of heartburn sensation that may actually be acid reflux.  He believes that he is able to distinguish the two different chest pain syndromes.  He had a [employer] study.  The nuclear images show a very large area of reversible ischemia matching the typical distribution of the proximal-mid LAD artery including the apex, septum and most of the anterior wall.  The basal two thirds of the anterior wall are spared suggesting that he lateral ramus intermedius artery.  He quit smoking about 7 years ago after smoking roughly a pack a day for about 15 years.  Still uses tobacco dip.  He has severely elevated LDL cholesterol and now takes both atorvastatin and ezetimibe.  Recent lipid profile is not available for review.  He has well treated systemic hypertension and is 100% compliant with CPAP for obstructive sleep apnea.  He denies daytime hypersomnolence.  He does  not have diabetes and does not have a family history of premature coronary disease.  Both of his parents are still alive and healthy. Past Medical History:  Diagnosis Date  . Hyperlipidemia   . Hypertension   . Sleep apnea    uses CPAP    Past Surgical History:  Procedure Laterality Date  . ELBOW SURGERY    . HAND SURGERY    . MASS EXCISION Right 10/31/2014   Procedure: EXCISION ANTERIOR NECK & RIGHT SUBMANDIBULAR CYSTS WITH COMPLEX CLOSURES;  Surgeon: [name], MD;  Location: [facility];  Service: ENT;  Laterality: Right;  CPAP    Current Medications: Current Meds  Medication Sig  . aspirin EC 81 MG tablet Take 81 mg by mouth daily.  [name] atorvastatin (LIPITOR) 40 MG tablet Take 40 mg by mouth every evening.   . ezetimibe (ZETIA) 10 MG tablet Take 10 mg by mouth daily with lunch.   . lisinopril-hydrochlorothiazide (PRINZIDE,ZESTORETIC) 10-12.5 MG tablet Take 1 tablet by mouth daily. MUST SCHEDULE ANNUAL EXAM FOR FURTHER REFILLS  . metoprolol succinate (TOPROL XL) 25 MG 24 hr tablet Take 1 tablet (25 mg total) by mouth daily.  . [DISCONTINUED] metoprolol succinate (TOPROL XL) 25 MG 24 hr tablet Take 1 tablet (25 mg total) by mouth daily.     Allergies:   Patient has no known allergies.   Social History   Socioeconomic History  . Marital status: Married  Spouse name: Not on file  . Number of children: Not on file  . Years of education: Not on file  . Highest education level: Not on file  Occupational History  . Not on file  Social Needs  . Financial resource strain: Not on file  . Food insecurity:    Worry: Not on file    Inability: Not on file  . Transportation needs:    Medical: Not on file    Non-medical: Not on file  Tobacco Use  . Smoking status: Former Smoker    Years: 6.00    Last attempt to quit: 08/07/1999    Years since quitting: 18.7  . Smokeless tobacco: Current User    Types: Chew  Substance and Sexual Activity  . Alcohol use: Yes     Alcohol/week: 1.0 standard drinks    Types: 1 Cans of beer per week    Comment: rare  . Drug use: No  . Sexual activity: Not on file  Lifestyle  . Physical activity:    Days per week: Not on file    Minutes per session: Not on file  . Stress: Not on file  Relationships  . Social connections:    Talks on phone: Not on file    Gets together: Not on file    Attends religious service: Not on file    Active member of club or organization: Not on file    Attends meetings of clubs or organizations: Not on file    Relationship status: Not on file  Other Topics Concern  . Not on file  Social History Narrative  . Not on file     Family History: The patient's family history includes Arthritis in his maternal grandfather and paternal grandmother. There is no history of Diabetes, Heart disease, or Stroke.  ROS:   Please see the history of present illness.     All other systems reviewed and are negative.  EKGs/Labs/Other Studies Reviewed:    The following studies were reviewed today: Nuclear stress test ECG and images  EKG:  EKG is  ordered today.  The ekg ordered today demonstrates sinus rhythm without ischemic repolarization abnormalities  Recent Labs: No results found for requested labs within last 8760 hours.  Recent Lipid Panel    Component Value Date/Time   CHOL 270 (H) 03/04/2015 0825   TRIG 217.0 (H) 03/04/2015 0825   HDL 40.80 03/04/2015 0825   CHOLHDL 7 03/04/2015 0825   VLDL 43.4 (H) 03/04/2015 0825   LDLCALC 205 (H) 05/23/2013 1416   LDLDIRECT 182.0 03/04/2015 0825    Physical Exam:    VS:  BP 113/79   Pulse 74   Ht 5\' 11"  (1.803 m)   Wt 254 lb (115.2 kg)   BMI 35.43 kg/m     Wt Readings from Last 3 Encounters:  04/28/18 254 lb (115.2 kg)  04/28/18 254 lb (115.2 kg)  04/07/15 254 lb (115.2 kg)     GEN: Obese,  Well nourished, well developed in no acute distress HEENT: Normal NECK: No JVD; No carotid bruits LYMPHATICS: No lymphadenopathy CARDIAC:  RRR, no murmurs, rubs, S4 gallop is present RESPIRATORY:  Clear to auscultation without rales, wheezing or rhonchi  ABDOMEN: Soft, non-tender, non-distended MUSCULOSKELETAL:  No edema; No deformity  SKIN: Warm and dry NEUROLOGIC:  Alert and oriented x 3 PSYCHIATRIC:  Normal affect   ASSESSMENT:    1. Coronary artery disease of native artery of native heart with stable angina pectoris (HCC)   2.  Mixed hyperlipidemia   3. Essential hypertension   4. OSA on CPAP   5. Severe obesity (BMI 35.0-35.9 with comorbidity) (HCC)   6. Abnormal nuclear stress test    PLAN:    In order of problems listed above:  1. CAD: Susy FrizzleMatt describes a very typical pattern of stable angina pectoris.  I suspect that his exertional dyspnea is also a consequence of coronary artery disease.  He seems to have extensive ischemia in the territory of the LAD artery.  He should undergo cardiac catheterization with coronary angiography and if necessary percutaneous plasty/stent placement.  We discussed the pros and cons and potential complications of both diagnostic will go ahead with it.  He is scheduled on Wednesday with Dr. Herbie BaltimoreHarding, who had a brief opportunity to meet with him in the clinic today.  If he develops symptoms at rest that lasts for more than 20-30 minutes in the interim, he should go to the emergency room.  He has already started taking aspirin 81 mg daily several months ago.  We will start a low-dose beta-blocker today. 2. HLP: We will retrieve his most recent lipid profile.  Discussed the fact that he treated to a target LDL cholesterol of no more than 78 he is already on a potent statin and on ezetimibe.  We discussed healthy changes to his diet exercise and the importance of weight loss in the long-term. 3. HTN: Well treated on the current medications.  Adding a beta-blocker.  May have to curtail use of the ACE inhibitor or diuretic in the future.  He should not take the lisinopril-hydrochlorothiazide on the day  of his cardiac catheterization. 4. OSA: Reports 100% compliance with therapy and good results symptomatically with CPAP. 5. Obesity: Once his coronary status is clarified and treated, routine physical exercise (in addition to a diet that is calorie restricted and low in carbohydrates with low glycemic index and low in saturated fats) will be important to achieve significant weight loss.   Medication Adjustments/Labs and Tests Ordered: Current medicines are reviewed at length with the patient today.  Concerns regarding medicines are outlined above.  Orders Placed This Encounter  Procedures  . Basic metabolic panel  . CBC   Meds ordered this encounter  Medications  . DISCONTD: metoprolol succinate (TOPROL XL) 25 MG 24 hr tablet    Sig: Take 1 tablet (25 mg total) by mouth daily.    Dispense:  90 tablet    Refill:  3  . metoprolol succinate (TOPROL XL) 25 MG 24 hr tablet    Sig: Take 1 tablet (25 mg total) by mouth daily.    Dispense:  90 tablet    Refill:  3    Patient Instructions      [facility] [address] [place] [id number] Dept: [phone] Loc: [phone]  David ButcherMatthew D Schaller  04/28/2018  You are scheduled for a Cardiac Catheterization on Wednesday, February 26 with Dr. Bryan Lemmaavid Harding.  1. [facility]: [address] [place], [place] [address] at 11:00 AM (This time is two hours before your procedure to ensure your preparation). Free valet parking service is available.   Special note: Every effort is made to have your procedure done on time. Please understand that emergencies sometimes delay scheduled procedures.  2. Diet: Do not eat solid foods after midnight.  The patient may have clear liquids until 5am upon the  day of the procedure.  3. Labs: You will need to have blood drawn today  4. Medication instructions in  preparation for your procedure:  START METOPROLOL SUCC ER 25 MG ONCE DAILY   On the morning of your procedure, take your Aspirin and any morning medicines.  You may use sips of water.  5. Plan for one night stay--bring personal belongings. 6. Bring a current list of your medications and current insurance cards. 7. You MUST have a responsible person to drive you home. 8. Someone MUST be with you the first 24 hours after you arrive home or your discharge will be delayed. 9. Please wear clothes that are easy to get on and off and wear slip-on shoes.  Thank you for allowing [place] to care for you!   -- [place] [facility]     Signed, [name], MD  04/28/2018 5:31 PM    [facility]

## 2018-04-28 NOTE — Progress Notes (Signed)
Patient seen by Dr. Royann Shivers after MPI study. Patient is scheduled for Cardiac Cath 05/03/18.

## 2018-04-28 NOTE — Patient Instructions (Addendum)
    [place] [facility] [address] [place] [address] [place] [address] Dept: [phone] Loc: [phone]  David Christian  04/28/2018  You are scheduled for a Cardiac Catheterization on Wednesday, February 26 with Dr. Bryan Lemma.  1. Please arrive at the [facility] (Main Entrance A) at [facility] [address] [place], [place] [address] at 11:00 AM (This time is two hours before your procedure to ensure your preparation). Free valet parking service is available.   Special note: Every effort is made to have your procedure done on time. Please understand that emergencies sometimes delay scheduled procedures.  2. Diet: Do not eat solid foods after midnight.  The patient may have clear liquids until 5am upon the day of the procedure.  3. Labs: You will need to have blood drawn today  4. Medication instructions in preparation for your procedure:  START METOPROLOL SUCC ER 25 MG ONCE DAILY   On the morning of your procedure, take your Aspirin and any morning medicines.  You may use sips of water.  5. Plan for one night stay--bring personal belongings. 6. Bring a current list of your medications and current insurance cards. 7. You MUST have a responsible person to drive you home. 8. Someone MUST be with you the first 24 hours after you arrive home or your discharge will be delayed. 9. Please wear clothes that are easy to get on and off and wear slip-on shoes.  Thank you for allowing [place] to care for you!   -- [place] [facility]

## 2018-04-29 DIAGNOSIS — R9439 Abnormal result of other cardiovascular function study: Secondary | ICD-10-CM | POA: Diagnosis present

## 2018-04-29 DIAGNOSIS — I209 Angina pectoris, unspecified: Secondary | ICD-10-CM | POA: Clinically undetermined

## 2018-04-29 LAB — CBC
HEMATOCRIT: 46.5 % (ref 37.5–51.0)
Hemoglobin: 15.2 g/dL (ref 13.0–17.7)
MCH: 28.6 pg (ref 26.6–33.0)
MCHC: 32.7 g/dL (ref 31.5–35.7)
MCV: 88 fL (ref 79–97)
Platelets: 341 10*3/uL (ref 150–450)
RBC: 5.31 x10E6/uL (ref 4.14–5.80)
RDW: 13.7 % (ref 11.6–15.4)
WBC: 8.1 10*3/uL (ref 3.4–10.8)

## 2018-04-29 LAB — BASIC METABOLIC PANEL
BUN / CREAT RATIO: 12 (ref 9–20)
BUN: 11 mg/dL (ref 6–24)
CHLORIDE: 96 mmol/L (ref 96–106)
CO2: 23 mmol/L (ref 20–29)
Calcium: 9.6 mg/dL (ref 8.7–10.2)
Creatinine, Ser: 0.94 mg/dL (ref 0.76–1.27)
GFR calc non Af Amer: 100 mL/min/{1.73_m2} (ref >59)
GFR, EST AFRICAN AMERICAN: 116 mL/min/{1.73_m2} (ref >59)
Glucose: 85 mg/dL (ref 65–99)
POTASSIUM: 4.4 mmol/L (ref 3.5–5.2)
Sodium: 137 mmol/L (ref 134–144)

## 2018-04-29 NOTE — Progress Notes (Signed)
Labs look good for cath  Bryan Lemma, MD

## 2018-05-02 ENCOUNTER — Telehealth: Payer: Self-pay | Admitting: *Deleted

## 2018-05-02 NOTE — Telephone Encounter (Signed)
Pt contacted pre-catheterization scheduled at [facility] for: Wednesday May 03, 2018 1 PM Verified arrival time and place: [facility] Main Entrance A at: 11AM  No solid food after midnight prior to cath, clear liquids until 5 AM day of procedure. Contrast allergy: no Verified no diabetes medications.  AM meds can be  taken pre-cath with sip of water including: ASA 81 mg  Confirmed patient has responsible person to drive home post procedure and observe 24 hours after arriving home:yes

## 2018-05-03 ENCOUNTER — Encounter (HOSPITAL_COMMUNITY): Payer: Self-pay | Admitting: *Deleted

## 2018-05-03 ENCOUNTER — Inpatient Hospital Stay (HOSPITAL_COMMUNITY): Payer: Managed Care, Other (non HMO)

## 2018-05-03 ENCOUNTER — Other Ambulatory Visit: Payer: Self-pay

## 2018-05-03 ENCOUNTER — Other Ambulatory Visit: Payer: Self-pay | Admitting: *Deleted

## 2018-05-03 ENCOUNTER — Inpatient Hospital Stay (HOSPITAL_COMMUNITY)
Admission: AD | Admit: 2018-05-03 | Discharge: 2018-05-09 | DRG: 234 | Disposition: A | Discharge status: Home Health | Payer: Managed Care, Other (non HMO) | Attending: Thoracic Surgery (Cardiothoracic Vascular Surgery) | Admitting: Thoracic Surgery (Cardiothoracic Vascular Surgery)

## 2018-05-03 ENCOUNTER — Encounter (HOSPITAL_COMMUNITY)
Admission: AD | Disposition: A | Discharge status: Home Health | Payer: Self-pay | Source: Home / Self Care | Attending: Thoracic Surgery (Cardiothoracic Vascular Surgery)

## 2018-05-03 DIAGNOSIS — Z6838 Body mass index (BMI) 38.0-38.9, adult: Secondary | ICD-10-CM | POA: Diagnosis not present

## 2018-05-03 DIAGNOSIS — I10 Essential (primary) hypertension: Secondary | ICD-10-CM | POA: Diagnosis present

## 2018-05-03 DIAGNOSIS — G4733 Obstructive sleep apnea (adult) (pediatric): Secondary | ICD-10-CM | POA: Diagnosis present

## 2018-05-03 DIAGNOSIS — Z951 Presence of aortocoronary bypass graft: Secondary | ICD-10-CM

## 2018-05-03 DIAGNOSIS — Z79899 Other long term (current) drug therapy: Secondary | ICD-10-CM

## 2018-05-03 DIAGNOSIS — R079 Chest pain, unspecified: Secondary | ICD-10-CM

## 2018-05-03 DIAGNOSIS — E8779 Other fluid overload: Secondary | ICD-10-CM | POA: Diagnosis not present

## 2018-05-03 DIAGNOSIS — J9811 Atelectasis: Secondary | ICD-10-CM | POA: Diagnosis not present

## 2018-05-03 DIAGNOSIS — I6521 Occlusion and stenosis of right carotid artery: Secondary | ICD-10-CM | POA: Diagnosis present

## 2018-05-03 DIAGNOSIS — I209 Angina pectoris, unspecified: Secondary | ICD-10-CM

## 2018-05-03 DIAGNOSIS — I25110 Atherosclerotic heart disease of native coronary artery with unstable angina pectoris: Secondary | ICD-10-CM | POA: Diagnosis not present

## 2018-05-03 DIAGNOSIS — R000 Tachycardia, unspecified: Secondary | ICD-10-CM | POA: Diagnosis not present

## 2018-05-03 DIAGNOSIS — D62 Acute posthemorrhagic anemia: Secondary | ICD-10-CM | POA: Diagnosis not present

## 2018-05-03 DIAGNOSIS — I2510 Atherosclerotic heart disease of native coronary artery without angina pectoris: Secondary | ICD-10-CM

## 2018-05-03 DIAGNOSIS — I25118 Atherosclerotic heart disease of native coronary artery with other forms of angina pectoris: Secondary | ICD-10-CM | POA: Diagnosis present

## 2018-05-03 DIAGNOSIS — F17220 Nicotine dependence, chewing tobacco, uncomplicated: Secondary | ICD-10-CM | POA: Diagnosis present

## 2018-05-03 DIAGNOSIS — Z7982 Long term (current) use of aspirin: Secondary | ICD-10-CM

## 2018-05-03 DIAGNOSIS — I25119 Atherosclerotic heart disease of native coronary artery with unspecified angina pectoris: Secondary | ICD-10-CM

## 2018-05-03 DIAGNOSIS — Z09 Encounter for follow-up examination after completed treatment for conditions other than malignant neoplasm: Secondary | ICD-10-CM

## 2018-05-03 DIAGNOSIS — I2589 Other forms of chronic ischemic heart disease: Secondary | ICD-10-CM | POA: Diagnosis present

## 2018-05-03 DIAGNOSIS — E782 Mixed hyperlipidemia: Secondary | ICD-10-CM | POA: Diagnosis present

## 2018-05-03 DIAGNOSIS — Z01810 Encounter for preprocedural cardiovascular examination: Secondary | ICD-10-CM

## 2018-05-03 DIAGNOSIS — R9439 Abnormal result of other cardiovascular function study: Secondary | ICD-10-CM

## 2018-05-03 HISTORY — DX: Atherosclerotic heart disease of native coronary artery without angina pectoris: I25.10

## 2018-05-03 HISTORY — DX: Angina pectoris, unspecified: I20.9

## 2018-05-03 HISTORY — PX: LEFT HEART CATH AND CORONARY ANGIOGRAPHY: CATH118249

## 2018-05-03 LAB — BLOOD GAS, ARTERIAL
Acid-base deficit: 0.3 mmol/L (ref 0.0–2.0)
Bicarbonate: 23.3 mmol/L (ref 20.0–28.0)
Drawn by: 347191
FIO2: 0.21
O2 Saturation: 97.4 %
PCO2 ART: 34.4 mmHg (ref 32.0–48.0)
PO2 ART: 98.1 mmHg (ref 83.0–108.0)
Patient temperature: 98.6
pH, Arterial: 7.445 (ref 7.350–7.450)

## 2018-05-03 LAB — TYPE AND SCREEN
ABO/RH(D): O POS
ANTIBODY SCREEN: NEGATIVE

## 2018-05-03 LAB — PULMONARY FUNCTION TEST
FEF 25-75 Pre: 4.78 L/sec
FEF2575-%Pred-Pre: 118 %
FEV1-%Pred-Pre: 99 %
FEV1-Pre: 4.3 L
FEV1FVC-%Pred-Pre: 104 %
FEV6-%Pred-Pre: 97 %
FEV6-Pre: 5.16 L
FEV6FVC-%Pred-Pre: 102 %
FVC-%Pred-Pre: 94 %
FVC-Pre: 5.16 L
Pre FEV1/FVC ratio: 83 %
Pre FEV6/FVC Ratio: 100 %

## 2018-05-03 LAB — URINALYSIS, COMPLETE (UACMP) WITH MICROSCOPIC
Bacteria, UA: NONE SEEN
Bilirubin Urine: NEGATIVE
Glucose, UA: NEGATIVE mg/dL
Hgb urine dipstick: NEGATIVE
Ketones, ur: NEGATIVE mg/dL
Leukocytes,Ua: NEGATIVE
Nitrite: NEGATIVE
Protein, ur: NEGATIVE mg/dL
Specific Gravity, Urine: 1.015 (ref 1.005–1.030)
pH: 7 (ref 5.0–8.0)

## 2018-05-03 LAB — SURGICAL PCR SCREEN
MRSA, PCR: NEGATIVE
STAPHYLOCOCCUS AUREUS: NEGATIVE

## 2018-05-03 LAB — ECHOCARDIOGRAM COMPLETE
Height: 71 in
Weight: 4056.46 oz

## 2018-05-03 LAB — ABO/RH: ABO/RH(D): O POS

## 2018-05-03 LAB — PROTIME-INR
INR: 1.2 (ref 0.8–1.2)
Prothrombin Time: 14.6 seconds (ref 11.4–15.2)

## 2018-05-03 LAB — MRSA PCR SCREENING: MRSA by PCR: NEGATIVE

## 2018-05-03 SURGERY — LEFT HEART CATH AND CORONARY ANGIOGRAPHY
Anesthesia: LOCAL

## 2018-05-03 MED ORDER — ASPIRIN EC 81 MG PO TBEC: 81.0000 mg | DELAYED_RELEASE_TABLET | Freq: Every day | ORAL | Status: DC

## 2018-05-03 MED ORDER — INSULIN REGULAR(HUMAN) IN NACL 100-0.9 UT/100ML-% IV SOLN
INTRAVENOUS | Status: AC
  Administered 2018-05-04: .6 [IU]/h via INTRAVENOUS
  Filled 2018-05-03: qty 100

## 2018-05-03 MED ORDER — PLASMA-LYTE 148 IV SOLN
INTRAVENOUS | Status: AC
  Administered 2018-05-04: 500 mL
  Filled 2018-05-03: qty 2.5

## 2018-05-03 MED ORDER — LIDOCAINE HCL (PF) 1 % IJ SOLN
INTRAMUSCULAR | Status: DC | PRN
  Administered 2018-05-03: 2 mL

## 2018-05-03 MED ORDER — PHENYLEPHRINE HCL-NACL 20-0.9 MG/250ML-% IV SOLN
30.0000 ug/min | INTRAVENOUS | Status: DC
  Filled 2018-05-03: qty 250

## 2018-05-03 MED ORDER — SODIUM CHLORIDE 0.9 % IV SOLN
1.5000 g | INTRAVENOUS | Status: AC
  Administered 2018-05-04: 1.5 g via INTRAVENOUS
  Filled 2018-05-03: qty 1.5

## 2018-05-03 MED ORDER — ASPIRIN 81 MG PO CHEW: 81.0000 mg | CHEWABLE_TABLET | ORAL | Status: DC

## 2018-05-03 MED ORDER — MIDAZOLAM HCL 2 MG/2ML IJ SOLN
INTRAMUSCULAR | Status: AC
  Filled 2018-05-03: qty 2

## 2018-05-03 MED ORDER — TEMAZEPAM 15 MG PO CAPS
15.0000 mg | ORAL_CAPSULE | Freq: Once | ORAL | Status: AC | PRN
  Administered 2018-05-03: 15 mg via ORAL
  Filled 2018-05-03: qty 1

## 2018-05-03 MED ORDER — METOPROLOL SUCCINATE ER 25 MG PO TB24
25.0000 mg | ORAL_TABLET | Freq: Every day | ORAL | Status: DC
  Administered 2018-05-03: 25 mg via ORAL
  Filled 2018-05-03: qty 1

## 2018-05-03 MED ORDER — SODIUM CHLORIDE 0.9 % IV SOLN: 250.0000 mL | INTRAVENOUS | Status: DC | PRN

## 2018-05-03 MED ORDER — SODIUM CHLORIDE 0.9 % IV SOLN
750.0000 mg | INTRAVENOUS | Status: DC
  Filled 2018-05-03: qty 750

## 2018-05-03 MED ORDER — SODIUM CHLORIDE 0.9 % IV SOLN: INTRAVENOUS | Status: AC

## 2018-05-03 MED ORDER — HEPARIN (PORCINE) IN NACL 1000-0.9 UT/500ML-% IV SOLN
INTRAVENOUS | Status: DC | PRN
  Administered 2018-05-03 (×2): 500 mL

## 2018-05-03 MED ORDER — NITROGLYCERIN IN D5W 200-5 MCG/ML-% IV SOLN
INTRAVENOUS | Status: AC
  Filled 2018-05-03: qty 250

## 2018-05-03 MED ORDER — TRANEXAMIC ACID (OHS) PUMP PRIME SOLUTION
2.0000 mg/kg | INTRAVENOUS | Status: DC
  Filled 2018-05-03: qty 2.3

## 2018-05-03 MED ORDER — CHLORHEXIDINE GLUCONATE CLOTH 2 % EX PADS: 6.0000 | MEDICATED_PAD | Freq: Once | CUTANEOUS | Status: AC

## 2018-05-03 MED ORDER — DEXMEDETOMIDINE HCL IN NACL 400 MCG/100ML IV SOLN
0.1000 ug/kg/h | INTRAVENOUS | Status: AC
  Administered 2018-05-04: .5 ug/kg/h via INTRAVENOUS
  Filled 2018-05-03: qty 100

## 2018-05-03 MED ORDER — METOPROLOL TARTRATE 12.5 MG HALF TABLET
12.5000 mg | ORAL_TABLET | Freq: Once | ORAL | Status: AC
  Administered 2018-05-04: 12.5 mg via ORAL
  Filled 2018-05-03: qty 1

## 2018-05-03 MED ORDER — EPINEPHRINE PF 1 MG/ML IJ SOLN
0.0000 ug/min | INTRAVENOUS | Status: DC
  Filled 2018-05-03: qty 4

## 2018-05-03 MED ORDER — SODIUM CHLORIDE 0.9 % WEIGHT BASED INFUSION
3.0000 mL/kg/h | INTRAVENOUS | Status: DC
  Administered 2018-05-03: 3 mL/kg/h via INTRAVENOUS

## 2018-05-03 MED ORDER — TRANEXAMIC ACID 1000 MG/10ML IV SOLN
1.5000 mg/kg/h | INTRAVENOUS | Status: AC
  Administered 2018-05-04: 1.5 mg/kg/h via INTRAVENOUS
  Filled 2018-05-03: qty 25

## 2018-05-03 MED ORDER — CHLORHEXIDINE GLUCONATE 0.12 % MT SOLN
15.0000 mL | Freq: Once | OROMUCOSAL | Status: AC
  Administered 2018-05-04: 15 mL via OROMUCOSAL
  Filled 2018-05-03: qty 15

## 2018-05-03 MED ORDER — SODIUM CHLORIDE 0.9 % IV SOLN
INTRAVENOUS | Status: DC
  Filled 2018-05-03: qty 30

## 2018-05-03 MED ORDER — SODIUM CHLORIDE 0.9% FLUSH: 3.0000 mL | Freq: Two times a day (BID) | INTRAVENOUS | Status: DC

## 2018-05-03 MED ORDER — SODIUM CHLORIDE 0.9 % WEIGHT BASED INFUSION
1.0000 mL/kg/h | INTRAVENOUS | Status: DC
  Administered 2018-05-03: 1 mL/kg/h via INTRAVENOUS

## 2018-05-03 MED ORDER — HEPARIN SODIUM (PORCINE) 1000 UNIT/ML IJ SOLN
INTRAMUSCULAR | Status: AC
  Filled 2018-05-03: qty 1

## 2018-05-03 MED ORDER — FENTANYL CITRATE (PF) 100 MCG/2ML IJ SOLN
INTRAMUSCULAR | Status: DC | PRN
  Administered 2018-05-03 (×4): 25 ug via INTRAVENOUS

## 2018-05-03 MED ORDER — MORPHINE SULFATE (PF) 2 MG/ML IV SOLN: 2.0000 mg | INTRAVENOUS | Status: DC | PRN

## 2018-05-03 MED ORDER — NITROGLYCERIN IN D5W 200-5 MCG/ML-% IV SOLN
2.0000 ug/min | INTRAVENOUS | Status: DC
  Filled 2018-05-03: qty 250

## 2018-05-03 MED ORDER — SODIUM CHLORIDE 0.9% FLUSH: 3.0000 mL | INTRAVENOUS | Status: DC | PRN

## 2018-05-03 MED ORDER — SODIUM CHLORIDE 0.9% FLUSH
3.0000 mL | Freq: Two times a day (BID) | INTRAVENOUS | Status: DC
  Administered 2018-05-03: 3 mL via INTRAVENOUS

## 2018-05-03 MED ORDER — MAGNESIUM SULFATE 50 % IJ SOLN
40.0000 meq | INTRAMUSCULAR | Status: DC
  Filled 2018-05-03: qty 9.85

## 2018-05-03 MED ORDER — CHLORHEXIDINE GLUCONATE CLOTH 2 % EX PADS
6.0000 | MEDICATED_PAD | Freq: Once | CUTANEOUS | Status: AC
  Administered 2018-05-03: 6 via TOPICAL

## 2018-05-03 MED ORDER — HEPARIN (PORCINE) 25000 UT/250ML-% IV SOLN
1400.0000 [IU]/h | INTRAVENOUS | Status: DC
  Administered 2018-05-03: 1400 [IU]/h via INTRAVENOUS
  Filled 2018-05-03: qty 250

## 2018-05-03 MED ORDER — MIDAZOLAM HCL 2 MG/2ML IJ SOLN
INTRAMUSCULAR | Status: DC | PRN
  Administered 2018-05-03: 0.5 mg via INTRAVENOUS
  Administered 2018-05-03 (×2): 1 mg via INTRAVENOUS

## 2018-05-03 MED ORDER — VANCOMYCIN HCL 10 G IV SOLR
1500.0000 mg | INTRAVENOUS | Status: AC
  Administered 2018-05-04: 1500 mg via INTRAVENOUS
  Filled 2018-05-03: qty 1500

## 2018-05-03 MED ORDER — LIDOCAINE HCL (PF) 1 % IJ SOLN
INTRAMUSCULAR | Status: AC
  Filled 2018-05-03: qty 30

## 2018-05-03 MED ORDER — LISINOPRIL-HYDROCHLOROTHIAZIDE 10-12.5 MG PO TABS: 1.0000 | ORAL_TABLET | Freq: Every day | ORAL | Status: DC

## 2018-05-03 MED ORDER — HEPARIN (PORCINE) IN NACL 1000-0.9 UT/500ML-% IV SOLN
INTRAVENOUS | Status: AC
  Filled 2018-05-03: qty 1000

## 2018-05-03 MED ORDER — DIAZEPAM 5 MG PO TABS
5.0000 mg | ORAL_TABLET | Freq: Once | ORAL | Status: AC
  Administered 2018-05-04: 5 mg via ORAL
  Filled 2018-05-03: qty 1

## 2018-05-03 MED ORDER — POTASSIUM CHLORIDE 2 MEQ/ML IV SOLN
80.0000 meq | INTRAVENOUS | Status: DC
  Filled 2018-05-03: qty 40

## 2018-05-03 MED ORDER — NITROGLYCERIN IN D5W 200-5 MCG/ML-% IV SOLN: 0.0000 ug/min | INTRAVENOUS | Status: DC

## 2018-05-03 MED ORDER — EZETIMIBE 10 MG PO TABS
10.0000 mg | ORAL_TABLET | Freq: Every day | ORAL | Status: DC
  Administered 2018-05-03: 10 mg via ORAL
  Filled 2018-05-03: qty 1

## 2018-05-03 MED ORDER — MILRINONE LACTATE IN DEXTROSE 20-5 MG/100ML-% IV SOLN
0.3000 ug/kg/min | INTRAVENOUS | Status: DC
  Filled 2018-05-03: qty 100

## 2018-05-03 MED ORDER — ACETAMINOPHEN 325 MG PO TABS
650.0000 mg | ORAL_TABLET | ORAL | Status: DC | PRN
  Administered 2018-05-03: 650 mg via ORAL
  Filled 2018-05-03: qty 2

## 2018-05-03 MED ORDER — FENTANYL CITRATE (PF) 100 MCG/2ML IJ SOLN
INTRAMUSCULAR | Status: AC
  Filled 2018-05-03: qty 2

## 2018-05-03 MED ORDER — VERAPAMIL HCL 2.5 MG/ML IV SOLN
INTRAVENOUS | Status: AC
  Filled 2018-05-03: qty 2

## 2018-05-03 MED ORDER — NOREPINEPHRINE 4 MG/250ML-% IV SOLN
0.0000 ug/min | INTRAVENOUS | Status: DC
  Filled 2018-05-03: qty 250

## 2018-05-03 MED ORDER — VERAPAMIL HCL 2.5 MG/ML IV SOLN
INTRAVENOUS | Status: DC | PRN
  Administered 2018-05-03: 10 mL via INTRA_ARTERIAL

## 2018-05-03 MED ORDER — NITROGLYCERIN IN D5W 200-5 MCG/ML-% IV SOLN
INTRAVENOUS | Status: AC | PRN
  Administered 2018-05-03: 20 ug/min via INTRAVENOUS

## 2018-05-03 MED ORDER — TRANEXAMIC ACID (OHS) BOLUS VIA INFUSION
15.0000 mg/kg | INTRAVENOUS | Status: AC
  Administered 2018-05-04: 1725 mg via INTRAVENOUS
  Filled 2018-05-03: qty 1725

## 2018-05-03 MED ORDER — ATORVASTATIN CALCIUM 80 MG PO TABS
80.0000 mg | ORAL_TABLET | Freq: Every evening | ORAL | Status: DC
  Administered 2018-05-03 – 2018-05-08 (×5): 80 mg via ORAL
  Filled 2018-05-03 (×4): qty 1

## 2018-05-03 MED ORDER — HEPARIN SODIUM (PORCINE) 1000 UNIT/ML IJ SOLN
INTRAMUSCULAR | Status: DC | PRN
  Administered 2018-05-03: 6000 [IU] via INTRAVENOUS

## 2018-05-03 MED ORDER — ONDANSETRON HCL 4 MG/2ML IJ SOLN
4.0000 mg | Freq: Four times a day (QID) | INTRAMUSCULAR | Status: DC | PRN
  Administered 2018-05-04: 4 mg via INTRAVENOUS

## 2018-05-03 MED ORDER — BISACODYL 5 MG PO TBEC: 5.0000 mg | DELAYED_RELEASE_TABLET | Freq: Once | ORAL | Status: DC

## 2018-05-03 MED ORDER — NITROGLYCERIN 0.4 MG SL SUBL
SUBLINGUAL_TABLET | SUBLINGUAL | Status: AC
  Filled 2018-05-03: qty 1

## 2018-05-03 MED ORDER — NITROGLYCERIN 0.4 MG SL SUBL
SUBLINGUAL_TABLET | SUBLINGUAL | Status: DC | PRN
  Administered 2018-05-03: .4 mg via SUBLINGUAL

## 2018-05-03 MED ORDER — IOHEXOL 350 MG/ML SOLN
INTRAVENOUS | Status: DC | PRN
  Administered 2018-05-03: 80 mL via INTRACARDIAC

## 2018-05-03 MED ORDER — DOPAMINE-DEXTROSE 3.2-5 MG/ML-% IV SOLN
0.0000 ug/kg/min | INTRAVENOUS | Status: AC
  Administered 2018-05-04: 3 ug/kg/min via INTRAVENOUS
  Filled 2018-05-03: qty 250

## 2018-05-03 SURGICAL SUPPLY — 11 items
CATH OPTITORQUE TIG 4.0 5F (CATHETERS) ×1 IMPLANT
CATH VISTA GUIDE 6FR XBLAD3.5 (CATHETERS) ×1 IMPLANT
DEVICE RAD COMP TR BAND LRG (VASCULAR PRODUCTS) ×1 IMPLANT
GLIDESHEATH SLEND SS 6F .021 (SHEATH) ×1 IMPLANT
GUIDEWIRE INQWIRE 1.5J.035X260 (WIRE) IMPLANT
INQWIRE 1.5J .035X260CM (WIRE) ×2
KIT HEART LEFT (KITS) ×2 IMPLANT
PACK CARDIAC CATHETERIZATION (CUSTOM PROCEDURE TRAY) ×2 IMPLANT
SHEATH PROBE COVER 6X72 (BAG) ×1 IMPLANT
TRANSDUCER W/STOPCOCK (MISCELLANEOUS) ×2 IMPLANT
TUBING CIL FLEX 10 FLL-RA (TUBING) ×2 IMPLANT

## 2018-05-03 NOTE — Progress Notes (Addendum)
Pre CABG evaluation completed. Please see preliminary notes on CV PROC under chart review. Result notified RN [place]. And also messaged to Dr. Herbie Baltimore, he is aware of this.  David Christian(RDMS RVT) 05/03/18 5:10 PM

## 2018-05-03 NOTE — Progress Notes (Signed)
TR Band removed 2x2 and tegaderm applied. No bleeding noted.

## 2018-05-03 NOTE — Progress Notes (Signed)
ANTICOAGULATION CONSULT NOTE - Initial Consult  Pharmacy Consult for heparin Indication: chest pain/ACS  No Known Allergies  Patient Measurements: Height: 5\' 11"  (180.3 cm) Weight: 253 lb 8.5 oz (115 kg) IBW/kg (Calculated) : 75.3 Heparin Dosing Weight: 100kg  Vital Signs: Temp: (P) 99 F (37.2 C) (02/26 1023) Temp Source: (P) Oral (02/26 1023) BP: 100/79 (02/26 1400) Pulse Rate: 102 (02/26 1400)  Labs: No results for input(s): HGB, HCT, PLT, APTT, LABPROT, INR, HEPARINUNFRC, HEPRLOWMOCWT, CREATININE, CKTOTAL, CKMB, TROPONINI in the last 72 hours.  Estimated Creatinine Clearance: 133.4 mL/min (by C-G formula based on SCr of 0.94 mg/dL).   Medical History: Past Medical History:  Diagnosis Date  . Hyperlipidemia   . Hypertension   . Sleep apnea    uses CPAP   Assessment: 43 year old male s/p cath this morning, found to have severe multivessel CAD. CVTS consulted for possible surgery tomorrow.   Orders received to start heparin 6 hours after TR band is removed.   Goal of Therapy:  Heparin level 0.3-0.7 units/ml Monitor platelets by anticoagulation protocol: Yes   Plan:  Start heparin infusion at 1400  Units/hr 6 hours after TR band removed Check anti-Xa level in 6 hours and daily while on heparin Continue to monitor H&H and platelets  Sheppard Coil PharmD., BCPS Clinical Pharmacist 05/03/2018 2:16 PM

## 2018-05-03 NOTE — Progress Notes (Signed)
Result of pre cabg doppler seen by MD.

## 2018-05-03 NOTE — Interval H&P Note (Signed)
History and Physical Interval Note:  05/03/2018 11:47 AM  David Christian  has presented today for surgery, with the diagnosis of progressive angina with abnormal nuclear stress test showing anterior ischemia.   The various methods of treatment have been discussed with the patient and family. After consideration of risks, benefits and other options for treatment, the patient has consented to  Procedure(s): LEFT HEART CATH AND CORONARY ANGIOGRAPHY (N/A) with possible PERCUTANEOUS CORONARY INTERVENTION as a surgical intervention .     The patient's history has been reviewed, patient examined, no change in status, stable for surgery.  I have reviewed the patient's chart and labs.  Questions were answered to the patient's satisfaction.     Cath Lab Visit (complete for each Cath Lab visit)  Clinical Evaluation Leading to the Procedure:   ACS: No.  Non-ACS:    Anginal Classification: CCS III  Anti-ischemic medical therapy: Minimal Therapy (1 class of medications)  Non-Invasive Test Results: High-risk stress test findings: cardiac mortality >3%/year  Prior CABG: No previous CABG    Bryan Lemma

## 2018-05-03 NOTE — Progress Notes (Signed)
[id number] Gave pt IS and he demonstrated 2500 ml correctly. Discussed importance of IS and mobility after surgery. Gave in the tube handout and discussed sternal precautions. Wife will be available after discharge to assist with care. Gave OHS booklet, IS, care guide and wrote down how to view pre op video. Will follow up after surgery. Luetta Nutting RN BSN 05/03/2018 2:36 PM

## 2018-05-03 NOTE — Progress Notes (Signed)
  Echocardiogram 2D Echocardiogram has been performed.  David Christian 05/03/2018, 3:15 PM

## 2018-05-03 NOTE — Consult Note (Addendum)
    [address]       [place], [address]             [phone]        David Christian [facility] Medical Record #[id number] Date of Birth: 09/19/1975  Referring: No ref. provider found Primary Care: Slatosky, John J., MD Primary Cardiologist: David Harding, MD  Chief Complaint: Chest pain with shortness of breath  History of Present Illness: David Christian is a pleasant 42-year-old male who is a history of hypertension, dyslipidemia, obstructive sleep apnea, obesity, remote tobacco smoker until about 18 years ago who is been having exertional chest pressure advancing to chest pain associated with shortness of breath for about 3 years.  He sought medical attention regarding the symptoms on several occasions and received treatment aimed at managing suspected asthma and gastroesophageal reflux disease.  After his symptoms persisted he sought medical attention from a cardiologist in recent weeks and underwent a Lexiscan Myoview on 04/28/2018 that demonstrated a large area of reversible ischemia in the distribution of the LAD.  Left heart catheterization was recommended and he presented today for the procedure on an elective basis.  Left heart catheterization showed an ostial to distal left main stenosis of 75%,  a proximal to mid LAD stenosis of 99%,  and an ostial right coronary artery stenosis of 50% with multiple distal lesions occluding an 80% stenosis of the right posterior lateral branch of the RCA.  His ejection fraction was estimated at 45 to 50%.  We have been asked to evaluate David Christian in regards to surgical coronary revascularization. David Christian was interviewed shortly after he arrived from the Cath Lab to the transitional care unit on 2C.  He has IV fluids and IV nitroglycerin infusing.  He is currently pain-free.  He has no family history of premature coronary artery disease.  As mentioned above, he has a history of obstructive sleep apnea and uses his CPAP device on a regular  basis.  He has been compliant with treatment for his dyslipidemia and hypertension for the past 3 to 4 years.  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    Current Activity/ Functional Status:   Zubrod Score: At the time of surgery this patient's most appropriate activity status/level should be described as: []    0    Normal activity, no symptoms [x]    1    Restricted in physical strenuous activity but ambulatory, able to do out light work []    2    Ambulatory and capable of self care, unable to do work activities, up and about                 more than 50%  Of the time                            []    3    Only limited self care, in bed greater than 50% of waking hours []    4    Completely disabled, no self care, confined to bed or chair []    5    Moribund  Past Medical History:  Diagnosis Date  . Hyperlipidemia   . Hypertension   . Sleep apnea    uses CPAP    Past Surgical History:  Procedure Laterality Date  . ELBOW SURGERY    . HAND SURGERY    .   MASS EXCISION Right 10/31/2014   Procedure: EXCISION ANTERIOR NECK & RIGHT SUBMANDIBULAR CYSTS WITH COMPLEX CLOSURES;  Surgeon: Paul Juengel, MD;  Location: [facility];  Service: ENT;  Laterality: Right;  CPAP    Social History   Tobacco Use  Smoking Status Former Smoker  . Years: 6.00  . Last attempt to quit: 08/07/1999  . Years since quitting: 18.7  Smokeless Tobacco Current User  . Types: Chew    Social History   Substance and Sexual Activity  Alcohol Use Yes  . Alcohol/week: 1.0 standard drinks  . Types: 1 Cans of beer per week   Comment: rare     No Known Allergies  Current Facility-Administered Medications  Medication Dose Route Frequency Provider Last Rate Last Dose  . 0.9 %  sodium chloride infusion   Intravenous Continuous Harding, David W, MD 75 mL/hr at 05/03/18 1254    . 0.9 %  sodium chloride infusion  250 mL Intravenous PRN Harding,  David W, MD      . acetaminophen (TYLENOL) tablet 650 mg  650 mg Oral Q4H PRN Harding, David W, MD      . [START ON 05/04/2018] aspirin EC tablet 81 mg  81 mg Oral Daily Harding, David W, MD      . atorvastatin (LIPITOR) tablet 80 mg  80 mg Oral QPM Harding, David W, MD      . ezetimibe (ZETIA) tablet 10 mg  10 mg Oral Q lunch Harding, David W, MD      . metoprolol succinate (TOPROL-XL) 24 hr tablet 25 mg  25 mg Oral Daily Harding, David W, MD      . morphine 2 MG/ML injection 2 mg  2 mg Intravenous Q1H PRN Harding, David W, MD      . nitroGLYCERIN 50 mg in dextrose 5 % 250 mL (0.2 mg/mL) infusion  0-200 mcg/min Intravenous Titrated Harding, David W, MD 6 mL/hr at 05/03/18 1343 20 mcg/min at 05/03/18 1343  . ondansetron (ZOFRAN) injection 4 mg  4 mg Intravenous Q6H PRN Harding, David W, MD      . sodium chloride flush (NS) 0.9 % injection 3 mL  3 mL Intravenous Q12H Harding, David W, MD      . sodium chloride flush (NS) 0.9 % injection 3 mL  3 mL Intravenous PRN Harding, David W, MD        Medications Prior to Admission  Medication Sig Dispense Refill Last Dose  . aspirin EC 81 MG tablet Take 81 mg by mouth daily.   05/03/2018 at 0800  . atorvastatin (LIPITOR) 40 MG tablet Take 40 mg by mouth every evening.    05/02/2018 at 0800  . ezetimibe (ZETIA) 10 MG tablet Take 10 mg by mouth daily with lunch.    05/02/2018 at 0800  . lisinopril-hydrochlorothiazide (PRINZIDE,ZESTORETIC) 10-12.5 MG tablet Take 1 tablet by mouth daily. MUST SCHEDULE ANNUAL EXAM FOR FURTHER REFILLS 90 tablet 0 05/02/2018 at 0800  . metoprolol succinate (TOPROL XL) 25 MG 24 hr tablet Take 1 tablet (25 mg total) by mouth daily. 90 tablet 3 05/02/2018 at 2000    Family History  Problem Relation Age of Onset  . Arthritis Maternal Grandfather   . Arthritis Paternal Grandmother   . Diabetes Neg Hx   . Heart disease Neg Hx   . Stroke Neg Hx      Review of Systems:   Review of Systems  Constitutional: Positive for  malaise/fatigue. Negative for chills and fever.  Eyes: Positive   for blurred vision (occasional). Negative for double vision.  Respiratory: Negative for shortness of breath.   Cardiovascular: Positive for chest pain. Negative for orthopnea, claudication and leg swelling.  Neurological: Negative for focal weakness and loss of consciousness.  All other systems reviewed and are negative.     Cardiac Review of Systems: Y or  [    ]= no  Chest Pain [ y   ]  Resting SOB [   ] Exertional SOB  [y  ]  Orthopnea [  ]   Pedal Edema [   ]    Palpitations [  ] Syncope  [  ]   Presyncope [   ]  General Review of Systems: [Y] = yes [  ]=no Constitional: recent weight change [  ]; anorexia [  ]; fatigue [  ]; nausea [  ]; night sweats [  ]; fever [  ]; or chills [  ]                                                               Dental: Last Dentist visit:   Eye : blurred vision [  ]; diplopia [   ]; vision changes [  ];  Amaurosis fugax[  ]; Resp: cough [  ];  wheezing[  ];  hemoptysis[  ]; shortness of breath[  ]; paroxysmal nocturnal dyspnea[  ]; dyspnea on exertion[y  ]; or orthopnea[  ];  GI:  gallstones[  ], vomiting[  ];  dysphagia[  ]; melena[  ];  hematochezia [  ]; heartburn[y  ];   Hx of  Colonoscopy[  ]; GU: kidney stones [  ]; hematuria[  ];   dysuria [  ];  nocturia[  ];  history of     obstruction [  ]; urinary frequency [  ]             Skin: rash, swelling[  ];, hair loss[  ];  peripheral edema[  ];  or itching[  ]; Musculosketetal: myalgias[  ];  joint swelling[  ];  joint erythema[  ];  joint pain[  ];  back pain[  ];  Heme/Lymph: bruising[  ];  bleeding[  ];  anemia[  ];  Neuro: TIA[  ];  headaches[  ];  stroke[  ];  vertigo[  ];  seizures[  ];   paresthesias[  ];  difficulty walking[  ];  Psych:depression[  ]; anxiety[  ];  Endocrine: diabetes[  ];  thyroid dysfunction[  ];            Physical Exam: BP (!) 110/57   Pulse 88   Temp (P) 99 F (37.2 C) (Oral)   Resp 17   Ht (P) 5'  11" (1.803 m)   Wt (P) 115.2 kg   SpO2 97%   BMI (P) 35.42 kg/m    General appearance: alert, cooperative, appears stated age, no distress and moderately obese Head: Normocephalic, without obvious abnormality, atraumatic Neck: no adenopathy, no carotid bruit, no JVD, supple, symmetrical, trachea midline and thyroid not enlarged, symmetric, no tenderness/mass/nodules Resp: clear to auscultation bilaterally Cardio: regular rate and rhythm, S1, S2 normal, no murmur, click, rub or gallop GI: soft, non-tender; bowel sounds normal; no masses,  no organomegaly Extremities: extremities normal, atraumatic, no cyanosis   or edema Neurologic: Grossly normal Normal Allen's test on left  Diagnostic Studies & Laboratory data:  Diagnostic Left Heart Catheterization  Dominance: Right  Left Main  Vessel is large.  Ost LM to Dist LM lesion 75% stenosed  Ost LM to Dist LM lesion is 75% stenosed. The lesion is concentric and irregular. Significant guide catheter damping with engagement.  Left Anterior Descending  Prox LAD to Mid LAD lesion 99% stenosed  Prox LAD to Mid LAD lesion is 99% stenosed. The lesion is segmental, irregular and chronically occluded with bridging, right-to-left and left-to-left collateral flow. The lesion is calcified.  First Septal Branch  Vessel is moderate in size.  Collaterals  1st Sept filled by collaterals from Inf Sept.    Second Diagonal Branch  Vessel is small in size.  Second Septal Branch  Vessel is small in size.  Collaterals  2nd Sept filled by collaterals from RPDA.    Left Circumflex  Vessel is large.  First Obtuse Marginal Branch  Vessel is small in size.  Second Obtuse Marginal Branch  Vessel is small in size.  Left Atrioventricular Groove Continuation  Vessel is small in size.  Right Coronary Artery  Vessel is large.  Ost RCA lesion 50% stenosed  Ost RCA lesion is 50% stenosed.  Prox RCA lesion 50% stenosed  Prox RCA lesion is 50% stenosed.    Mid RCA lesion 55% stenosed  Mid RCA lesion is 55% stenosed. The lesion is located at the bend.  Acute Marginal Branch  Vessel is small in size.  Right Posterior Atrioventricular Branch  Vessel is large in size.  Post Atrio lesion 80% stenosed  Post Atrio lesion is 80% stenosed.  First Right Posterolateral  Vessel is small in size.  Intervention   No interventions have been documented.  Wall Motion   Resting       Cannot exclude regional wall motion normality in the anterior wall. Inadequate opacification. Will order 2D echo          Left Heart   Left Ventricle Low normal LV end diastolic pressure is normal. The left ventricular ejection fraction is 45-50% by visual estimate. Cannot exclude segmental wall motion normality. Recommend echocardiogram  Coronary Diagrams   Diagnostic  Dominance: Right         Nuclear Stress Findings   Isotope administration Rest isotope was administered with an IV injection of 10.7 mCi Tc99m Tetrofosmin. Rest SPECT images were obtained approximately 45 minutes post tracer injection. Stress isotope was administered with an IV injection of 30.6 mCi Tc99m Tetrofosmin Stress SPECT images were obtained approximately 60 minutes post tracer injection.  Nuclear Study Quality Overall image quality is good. There is no nuclear artifact present.  Nuclear Measurements Study was gated.  Rest Perfusion Rest perfusion normal.  Stress Perfusion Stress perfusion abnormal. There is a defect present in the mid anterior, mid anteroseptal, apical anterior and apical septal location.  Perfusion Summary Defect 1:  There is a large defect of moderate severity present in the mid anterior, mid anteroseptal, apical anterior and apical septal location. The defect is reversible. Large area of moderate severity ischemia on stress imaging from mid to apical anterior and anteroseptal walls concerning for ischemia.  Overall Study Impression Myocardial perfusion is abnormal.  Findings consistent with ischemia. This is a high risk study. Overall left ventricular systolic function was abnormal. LV cavity size is normal. Nuclear stress EF: 48%. The left ventricular ejection fraction is mildly decreased (45-54%). There is no prior study for   comparison.  From: ACCF/SCAI/STS/AATS/AHA/ASNC/HFSA/SCCT 2012 Appropriate Use Criteria for Coronary Revascularization Focused Update  Wall Scoring   Hypokinesis of mid to apical anterior and anteroseptal walls, otherwise normal.        Resulted by:   Signed Date/Time  Phone Pager  CHRISTOPHER, BRIDGETTE 04/28/2018 4:14 PM [phone]   Report approved and finalized on 04/28/2018 1614     Recent Radiology Findings:   No results found.   I have independently reviewed the above radiologic studies and discussed with the patient   Recent Lab Findings: Lab Results  Component Value Date   WBC 8.1 04/28/2018   HGB 15.2 04/28/2018   HCT 46.5 04/28/2018   PLT 341 04/28/2018   GLUCOSE 85 04/28/2018   CHOL 270 (H) 03/04/2015   TRIG 217.0 (H) 03/04/2015   HDL 40.80 03/04/2015   LDLDIRECT 182.0 03/04/2015   LDLCALC 205 (H) 05/23/2013   ALT 18 04/07/2015   AST 20 04/07/2015   NA 137 04/28/2018   K 4.4 04/28/2018   CL 96 04/28/2018   CREATININE 0.94 04/28/2018   BUN 11 04/28/2018   CO2 23 04/28/2018   TSH 1.14 03/04/2015   HGBA1C 5.3 03/04/2015      Assessment / Plan:    Very pleasant 42-year-old male with a 2 to 3-year history of exertional chest pain associated with shortness of breath.  He was recently diagnosed but the cardiologist with class III angina and was proven to have significant reversible ischemic defect in the distribution of the LAD on Lexiscan Myoview.  Left heart catheterization performed earlier today demonstrates severe three-vessel coronary artery disease with critical left main coronary artery stenosis with lesions as described above in the HPI.  Surgical coronary revascularization is indicated for  survival benefit and relief of symptoms.  The procedure was described to the patient and his questions were answered.  We have offered to schedule surgery tomorrow and he has agreed to proceed. We will work toward completing preoperative evaluation including echocardiography and a carotid duplex scan.  Would avoid ACE inhibitors for renal protection in anticipation of surgery tomorrow.  I have discontinued his lisinopril.  He is currently on a nitroglycerin drip and has orders to be started on a heparin infusion once his radial arteriotomy is hemostatic.    I  spent 25 minutes counseling the patient face to face.   Myron G. Roddenberry, PA-C 05/03/2018 1:53 PM  I have seen and examined David Christian personally. He is a 42 yo man with a history of hypertension, hyperlipidemia and sleep apnea. No significant family history of premature CAD. Has been having exertional chest pain for " a couple of years." Recently has gotten worse. Squeezing, burning pain that radiates to jaw and both arms. Cath shows left main and 3 vessel CAD. Normal LV function with no significant valve pathology. CABG indicated for survival benefit and relief of symptoms.   I discussed the general nature of the procedure, the need for general anesthesia, the use of cardiopulmonary bypass, and the incisions to be used with David Christian. We discussed the expected hospital stay, overall recovery and short and long term outcomes. I informed him of the indications, risks, benefits and alternatives. He understands the risks include, but are not limited to death, stroke, MI, DVT/PE, bleeding, possible need for transfusion, infections, cardiac arrhythmias, as well as other organ system dysfunction including respiratory, renal, or GI complications.   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 Asymptomatic. Best option is probably to   proceed with CABG given the urgency (pain during cath). I have asked Dr. Early from Vascular surgery  to consult.  He accepts the risks and agrees to proceed.  Shantale Holtmeyer C. Enyah Moman, MD [facility] [phone]   

## 2018-05-03 NOTE — Progress Notes (Signed)
Family in room; Dr. Herbie Baltimore talking w/patient and family

## 2018-05-03 NOTE — Consult Note (Signed)
[facility]  Patient name: DECLIN KRAS MRN: [id number] DOB: 1975-10-26 Sex: male  REASON FOR CONSULT: Severe right internal carotid artery stenosis, asymptomatic  HPI: David Christian is a 43 y.o. male, who is seen for discussion of severe right internal carotid artery stenosis.  He is right-handed.  He is a very pleasant gentleman who was evaluated for angina.  He had a strongly positive nuclear cardiac test and underwent cardiac catheterization today.  Was found to have critical coronary disease and is scheduled for coronary artery bypass grafting with Dr. Dorris Fetch tomorrow.  His preoperative noninvasive vascular lab studies revealed a severe right internal carotid artery stenosis when he is seen for discussion of this.  The patient specifically denies any prior history of amaurosis fugax, a aphasia, TIA or stroke.  He was a former smoker but quit in 2001.    Past Medical History:  Diagnosis Date  . Anginal pain (HCC)   . Coronary artery disease   . Hyperlipidemia   . Hypertension   . Sleep apnea    uses CPAP    Family History  Problem Relation Age of Onset  . Arthritis Maternal Grandfather   . Arthritis Paternal Grandmother   . Diabetes Neg Hx   . Heart disease Neg Hx   . Stroke Neg Hx     SOCIAL HISTORY: Social History   Socioeconomic History  . Marital status: Married    Spouse name: Not on file  . Number of children: Not on file  . Years of education: Not on file  . Highest education level: Not on file  Occupational History  . Not on file  Social Needs  . Financial resource strain: Not on file  . Food insecurity:    Worry: Not on file    Inability: Not on file  . Transportation needs:    Medical: Not on file    Non-medical: Not on file  Tobacco Use  . Smoking status: Former Smoker    Years: 6.00    Last attempt to quit: 08/07/1999    Years since quitting: 18.7  . Smokeless tobacco:  Current User    Types: Chew  Substance and Sexual Activity  . Alcohol use: Yes    Alcohol/week: 1.0 standard drinks    Types: 1 Cans of beer per week    Comment: rare  . Drug use: No  . Sexual activity: Not on file  Lifestyle  . Physical activity:    Days per week: Not on file    Minutes per session: Not on file  . Stress: Not on file  Relationships  . Social connections:    Talks on phone: Not on file    Gets together: Not on file    Attends religious service: Not on file    Active member of club or organization: Not on file    Attends meetings of clubs or organizations: Not on file    Relationship status: Not on file  . Intimate partner violence:    Fear of current or ex partner: Not on file    Emotionally abused: Not on file    Physically abused: Not on file    Forced sexual activity: Not on file  Other Topics Concern  . Not on file  Social History Narrative  . Not on file    No Known Allergies  Current Facility-Administered Medications  Medication Dose Route Frequency Provider Last Rate Last Dose  . 0.9 %  sodium  chloride infusion  250 mL Intravenous PRN [name], MD      . acetaminophen (TYLENOL) tablet 650 mg  650 mg Oral Q4H PRN [name], MD      . Melene Muller ON 05/04/2018] aspirin EC tablet 81 mg  81 mg Oral Daily [name], MD      . atorvastatin (LIPITOR) tablet 80 mg  80 mg Oral QPM [name], MD   80 mg at 05/03/18 1813  . bisacodyl (DULCOLAX) EC tablet 5 mg  5 mg Oral Once [name], MD      . Melene Muller ON 05/04/2018] cefUROXime (ZINACEF) 1.5 g in sodium chloride 0.9 % 100 mL IVPB  1.5 g Intravenous To OR Donata Clay, Theron Arista, MD      . Melene Muller ON 05/04/2018] cefUROXime (ZINACEF) 750 mg in sodium chloride 0.9 % 100 mL IVPB  750 mg Intravenous To OR Donata Clay, Theron Arista, MD      . Melene Muller ON 05/04/2018] dexmedetomidine (PRECEDEX) 400 MCG/100ML (4 mcg/mL) infusion  0.1-0.7 mcg/kg/hr Intravenous To OR Donata Clay, Theron Arista, MD      . Melene Muller ON  05/04/2018] DOPamine (INTROPIN) 800 mg in dextrose 5 % 250 mL (3.2 mg/mL) infusion  0-10 mcg/kg/min Intravenous To OR Donata Clay, Theron Arista, MD      . Melene Muller ON 05/04/2018] EPINEPHrine (ADRENALIN) 4 mg in dextrose 5 % 250 mL (0.016 mg/mL) infusion  0-10 mcg/min Intravenous To OR Donata Clay, Theron Arista, MD      . ezetimibe (ZETIA) tablet 10 mg  10 mg Oral Q lunch [name], MD   10 mg at 05/03/18 1548  . [START ON 05/04/2018] heparin 2,500 Units, papaverine 30 mg in electrolyte-148 (PLASMALYTE-148) 500 mL irrigation   Irrigation To OR Donata Clay, Theron Arista, MD      . Melene Muller ON 05/04/2018] heparin 30,000 units/NS 1000 mL solution for CELLSAVER   Other To OR Donata Clay, Theron Arista, MD      . heparin ADULT infusion 100 units/mL (25000 units/290mL sodium chloride 0.45%)  1,400 Units/hr Intravenous Continuous Earnie Larsson, RPH      . [START ON 05/04/2018] insulin regular, human (MYXREDLIN) 100 units/ 100 mL infusion   Intravenous To OR Donata Clay, Theron Arista, MD      . Melene Muller ON 05/04/2018] magnesium sulfate (IV Push/IM) injection 40 mEq  40 mEq Other To OR Donata Clay, Theron Arista, MD      . metoprolol succinate (TOPROL-XL) 24 hr tablet 25 mg  25 mg Oral Daily [name], MD   25 mg at 05/03/18 1549  . [START ON 05/04/2018] milrinone (PRIMACOR) 20 MG/100 ML (0.2 mg/mL) infusion  0.3 mcg/kg/min Intravenous To OR Donata Clay, Theron Arista, MD      . morphine 2 MG/ML injection 2 mg  2 mg Intravenous Q1H PRN [name], MD      . nitroGLYCERIN 50 mg in dextrose 5 % 250 mL (0.2 mg/mL) infusion  0-200 mcg/min Intravenous Titrated [name], MD 6 mL/hr at 05/03/18 1800 20 mcg/min at 05/03/18 1800  . [START ON 05/04/2018] nitroGLYCERIN 50 mg in dextrose 5 % 250 mL (0.2 mg/mL) infusion  2-200 mcg/min Intravenous To OR Donata Clay, Theron Arista, MD      . Melene Muller ON 05/04/2018] norepinephrine (LEVOPHED) 4mg  in premix infusion  0-40 mcg/min Intravenous To OR Donata Clay, Theron Arista, MD      . ondansetron [facility]) injection 4 mg  4 mg Intravenous Q6H PRN  [name], MD      . [  START ON 05/04/2018] phenylephrine (NEOSYNEPHRINE) 20-0.9 MG/250ML-% infusion  30-200 mcg/min Intravenous To OR Donata Clay, Theron Arista, MD      . Melene Muller ON 05/04/2018] potassium chloride injection 80 mEq  80 mEq Other To OR Donata Clay, Theron Arista, MD      . sodium chloride flush (NS) 0.9 % injection 3 mL  3 mL Intravenous Q12H [name], MD   3 mL at 05/03/18 1814  . sodium chloride flush (NS) 0.9 % injection 3 mL  3 mL Intravenous PRN [name], MD      . temazepam (RESTORIL) capsule 15 mg  15 mg Oral Once PRN [name], MD      . Melene Muller ON 05/04/2018] tranexamic acid (CYKLOKAPRON) 2,500 mg in sodium chloride 0.9 % 250 mL (10 mg/mL) infusion  1.5 mg/kg/hr Intravenous To OR Donata Clay, Theron Arista, MD      . Melene Muller ON 05/04/2018] tranexamic acid (CYKLOKAPRON) bolus via infusion - over 30 minutes 1,725 mg  15 mg/kg Intravenous To OR Donata Clay, Theron Arista, MD      . Melene Muller ON 05/04/2018] tranexamic acid (CYKLOKAPRON) pump prime solution 230 mg  2 mg/kg Intracatheter To OR Donata Clay, Theron Arista, MD      . Melene Muller ON 05/04/2018] vancomycin (VANCOCIN) 1,500 mg in sodium chloride 0.9 % 250 mL IVPB  1,500 mg Intravenous To OR Donata Clay, Theron Arista, MD        REVIEW OF SYSTEMS:  Reviewed in his history and physical with nothing to add  PHYSICAL EXAM: Vitals:   05/03/18 1334 05/03/18 1345 05/03/18 1400 05/03/18 1634  BP:  (!) 98/50 100/79 (!) 96/53  Pulse:  89 (!) 102   Resp:  Temp:    98.2 F (36.8 C)  TempSrc:    Oral  SpO2:  97% 98% 97%  Weight: 115 kg     Height:  (1.803 m)       GENERAL: The patient is a well-nourished male, in no acute distress. The vital signs are documented above. CARDIOVASCULAR: Carotid arteries without bruits bilaterally.  2+ radial pulses bilaterally.  2+ dorsalis pedis pulses bilaterally. PULMONARY: There is good air exchange  ABDOMEN: Soft and non-tender  MUSCULOSKELETAL: There are no major deformities or cyanosis. NEUROLOGIC: No  focal weakness or paresthesias are detected. SKIN: There are no ulcers or rashes noted. PSYCHIATRIC: The patient has a normal affect.  DATA:  Carotid duplex images were reviewed.  This does show high-grade stenosis of his right internal carotid artery.  Systolic velocities are 397 cm/s and end-diastolic 195.  He does have extension of the stenosis into the internal carotid artery above the bifurcation.  Left carotid stenosis in the 40 to 59% range.  He does have some disturbed flow in the right vertebral artery as well but both the right and left vertebral arteries are patent.  MEDICAL ISSUES: I discussed the significance of this at length with the patient.  I have recommended right carotid endarterectomy for his severe asymptomatic carotid disease.  I discussed the timing of this.  He is asymptomatic and the plaque does extend up into the internal carotid above the bifurcation.  I feel that this may be somewhat difficult endarterectomy.  I would recommend coronary artery bypass grafting is planned for tomorrow and staged carotid endarterectomy in the future.  Will need CT angiogram for further imaging of his carotid anatomy prior to carotid surgery.  We will follow along during his recovery from coronary artery bypass grafting  [name], MD FACS [facility] [address] Tel [phone] Pager [phone]

## 2018-05-03 NOTE — H&P (View-Only) (Signed)
[address]       [place] [address]             [phone]        [name] Dana Medical Record #[id number] Date of Birth: January 08, 1976  Referring: No ref. provider found Primary Care: Nonnie Done., MD Primary Cardiologist: Bryan Lemma, MD  Chief Complaint: Chest pain with shortness of breath  History of Present Illness: Mr. Lacina is a pleasant 43 year old male who is a history of hypertension, dyslipidemia, obstructive sleep apnea, obesity, remote tobacco smoker until about 18 years ago who is been having exertional chest pressure advancing to chest pain associated with shortness of breath for about 3 years.  He sought medical attention regarding the symptoms on several occasions and received treatment aimed at managing suspected asthma and gastroesophageal reflux disease.  After his symptoms persisted he sought medical attention from a cardiologist in recent weeks and underwent a Lexiscan Myoview on 04/28/2018 that demonstrated a large area of reversible ischemia in the distribution of the LAD.  Left heart catheterization was recommended and he presented today for the procedure on an elective basis.  Left heart catheterization showed an ostial to distal left main stenosis of 75%,  a proximal to mid LAD stenosis of 99%,  and an ostial right coronary artery stenosis of 50% with multiple distal lesions occluding an 80% stenosis of the right posterior lateral branch of the RCA.  His ejection fraction was estimated at 45 to 50%.  We have been asked to evaluate Mr. Haislip in regards to surgical coronary revascularization. Mr. Burford was interviewed shortly after he arrived from the Cath Lab to the transitional care unit on 2C.  He has IV fluids and IV nitroglycerin infusing.  He is currently pain-free.  He has no family history of premature coronary artery disease.  As mentioned above, he has a history of obstructive sleep apnea and uses his CPAP device on a regular  basis.  He has been compliant with treatment for his dyslipidemia and hypertension for the past 3 to 4 years.  He is employed as an Set designer.    Current Activity/ Functional Status:   Zubrod Score: At the time of surgery this patient's most appropriate activity status/level should be described as: []     0    Normal activity, no symptoms [x]     1    Restricted in physical strenuous activity but ambulatory, able to do out light work []     2    Ambulatory and capable of self care, unable to do work activities, up and about                 more than 50%  Of the time                            []     3    Only limited self care, in bed greater than 50% of waking hours []     4    Completely disabled, no self care, confined to bed or chair []     5    Moribund  Past Medical History:  Diagnosis Date  . Hyperlipidemia   . Hypertension   . Sleep apnea    uses CPAP    Past Surgical History:  Procedure Laterality Date  . ELBOW SURGERY    . HAND SURGERY    .  MASS EXCISION Right 10/31/2014   Procedure: EXCISION ANTERIOR NECK & RIGHT SUBMANDIBULAR CYSTS WITH COMPLEX CLOSURES;  Surgeon: [name], MD;  Location: [facility];  Service: ENT;  Laterality: Right;  CPAP    Social History   Tobacco Use  Smoking Status Former Smoker  . Years: 6.00  . Last attempt to quit: 08/07/1999  . Years since quitting: 18.7  Smokeless Tobacco Current User  . Types: Chew    Social History   Substance and Sexual Activity  Alcohol Use Yes  . Alcohol/week: 1.0 standard drinks  . Types: 1 Cans of beer per week   Comment: rare     No Known Allergies  Current Facility-Administered Medications  Medication Dose Route Frequency Provider Last Rate Last Dose  . 0.9 %  sodium chloride infusion   Intravenous Continuous [name], MD 75 mL/hr at 05/03/18 1254    . 0.9 %  sodium chloride infusion  250 mL Intravenous PRN [name], MD      . acetaminophen (TYLENOL) tablet 650 mg  650 mg Oral Q4H PRN [name], MD      . Melene Muller ON 05/04/2018] aspirin EC tablet 81 mg  81 mg Oral Daily [name], MD      . atorvastatin (LIPITOR) tablet 80 mg  80 mg Oral QPM [name], MD      . ezetimibe (ZETIA) tablet 10 mg  10 mg Oral Q lunch [name], MD      . metoprolol succinate (TOPROL-XL) 24 hr tablet 25 mg  25 mg Oral Daily [name], MD      . morphine 2 MG/ML injection 2 mg  2 mg Intravenous Q1H PRN [name], MD      . nitroGLYCERIN 50 mg in dextrose 5 % 250 mL (0.2 mg/mL) infusion  0-200 mcg/min Intravenous Titrated [name], MD 6 mL/hr at 05/03/18 1343 20 mcg/min at 05/03/18 1343  . ondansetron (ZOFRAN) injection 4 mg  4 mg Intravenous Q6H PRN [name], MD      . sodium chloride flush (NS) 0.9 % injection 3 mL  3 mL Intravenous Q12H [name], MD      . sodium chloride flush (NS) 0.9 % injection 3 mL  3 mL Intravenous PRN [name], MD        Medications Prior to Admission  Medication Sig Dispense Refill Last Dose  . aspirin EC 81 MG tablet Take 81 mg by mouth daily.   05/03/2018 at 0800  . atorvastatin (LIPITOR) 40 MG tablet Take 40 mg by mouth every evening.    05/02/2018 at 0800  . ezetimibe (ZETIA) 10 MG tablet Take 10 mg by mouth daily with lunch.    05/02/2018 at 0800  . lisinopril-hydrochlorothiazide (PRINZIDE,ZESTORETIC) 10-12.5 MG tablet Take 1 tablet by mouth daily. MUST SCHEDULE ANNUAL EXAM FOR FURTHER REFILLS 90 tablet 0 05/02/2018 at 0800  . metoprolol succinate (TOPROL XL) 25 MG 24 hr tablet Take 1 tablet (25 mg total) by mouth daily. 90 tablet 3 05/02/2018 at 2000    Family History  Problem Relation Age of Onset  . Arthritis Maternal Grandfather   . Arthritis Paternal Grandmother   . Diabetes Neg Hx   . Heart disease Neg Hx   . Stroke Neg Hx      Review of Systems:   Review of Systems  Constitutional: Positive for  malaise/fatigue. Negative for chills and fever.  Eyes: Positive  for blurred vision (occasional). Negative for double vision.  Respiratory: Negative for shortness of breath.   Cardiovascular: Positive for chest pain. Negative for orthopnea, claudication and leg swelling.  Neurological: Negative for focal weakness and loss of consciousness.  All other systems reviewed and are negative.     Cardiac Review of Systems: Y or  [    ]= no  Chest Pain [ y   ]  Resting SOB [   ] Exertional SOB  Cove.Etienne  ]  Orthopnea [  ]   Pedal Edema [   ]    Palpitations [  ] Syncope  [  ]   Presyncope [   ]  General Review of Systems: [Y] = yes [  ]=no Constitional: recent weight change [  ]; anorexia [  ]; fatigue [  ]; nausea [  ]; night sweats [  ]; fever [  ]; or chills [  ]                                                               Dental: Last Dentist visit:   Eye : blurred vision [  ]; diplopia [   ]; vision changes [  ];  Amaurosis fugax[  ]; Resp: cough [  ];  wheezing[  ];  hemoptysis[  ]; shortness of breath[  ]; paroxysmal nocturnal dyspnea[  ]; dyspnea on exertion[y  ]; or orthopnea[  ];  GI:  gallstones[  ], vomiting[  ];  dysphagia[  ]; melena[  ];  hematochezia [  ]; heartburn[y  ];   Hx of  Colonoscopy[  ]; GU: kidney stones [  ]; hematuria[  ];   dysuria [  ];  nocturia[  ];  history of     obstruction [  ]; urinary frequency [  ]             Skin: rash, swelling[  ];, hair loss[  ];  peripheral edema[  ];  or itching[  ]; Musculosketetal: myalgias[  ];  joint swelling[  ];  joint erythema[  ];  joint pain[  ];  back pain[  ];  Heme/Lymph: bruising[  ];  bleeding[  ];  anemia[  ];  Neuro: TIA[  ];  headaches[  ];  stroke[  ];  vertigo[  ];  seizures[  ];   paresthesias[  ];  difficulty walking[  ];  Psych:depression[  ]; anxiety[  ];  Endocrine: diabetes[  ];  thyroid dysfunction[  ];            Physical Exam: BP (!) 110/57   Pulse 88   Temp (P) 99 F (37.2 C) (Oral)   Resp 17   Ht (P) 5'  11" (1.803 m)   Wt (P) 115.2 kg   SpO2 97%   BMI (P) 35.42 kg/m    General appearance: alert, cooperative, appears stated age, no distress and moderately obese Head: Normocephalic, without obvious abnormality, atraumatic Neck: no adenopathy, no carotid bruit, no JVD, supple, symmetrical, trachea midline and thyroid not enlarged, symmetric, no tenderness/mass/nodules Resp: clear to auscultation bilaterally Cardio: regular rate and rhythm, S1, S2 normal, no murmur, click, rub or gallop GI: soft, non-tender; bowel sounds normal; no masses,  no organomegaly Extremities: extremities normal, atraumatic, no cyanosis  or edema Neurologic: Grossly normal Normal Allen's test on left  Diagnostic Studies & Laboratory data:  Diagnostic Left Heart Catheterization  Dominance: Right  Left Main  Vessel is large.  Ost LM to Dist LM lesion 75% stenosed  Ost LM to Dist LM lesion is 75% stenosed. The lesion is concentric and irregular. Significant guide catheter damping with engagement.  Left Anterior Descending  Prox LAD to Mid LAD lesion 99% stenosed  Prox LAD to Mid LAD lesion is 99% stenosed. The lesion is segmental, irregular and chronically occluded with bridging, right-to-left and left-to-left collateral flow. The lesion is calcified.  First Septal Branch  Vessel is moderate in size.  Collaterals  1st Sept filled by collaterals from Inf Sept.    Second Diagonal Branch  Vessel is small in size.  Second Septal Branch  Vessel is small in size.  Collaterals  2nd Sept filled by collaterals from RPDA.    Left Circumflex  Vessel is large.  First Obtuse Marginal Branch  Vessel is small in size.  Second Obtuse Marginal Branch  Vessel is small in size.  Left Atrioventricular Groove Continuation  Vessel is small in size.  Right Coronary Artery  Vessel is large.  Ost RCA lesion 50% stenosed  Ost RCA lesion is 50% stenosed.  Prox RCA lesion 50% stenosed  Prox RCA lesion is 50% stenosed.    Mid RCA lesion 55% stenosed  Mid RCA lesion is 55% stenosed. The lesion is located at the bend.  Acute Marginal Branch  Vessel is small in size.  Right Posterior Atrioventricular Branch  Vessel is large in size.  Post Atrio lesion 80% stenosed  Post Atrio lesion is 80% stenosed.  First Right Posterolateral  Vessel is small in size.  Intervention   No interventions have been documented.  Wall Motion   Resting       Cannot exclude regional wall motion normality in the anterior wall. Inadequate opacification. Will order 2D echo          Left Heart   Left Ventricle Low normal LV end diastolic pressure is normal. The left ventricular ejection fraction is 45-50% by visual estimate. Cannot exclude segmental wall motion normality. Recommend echocardiogram  Coronary Diagrams   Diagnostic  Dominance: Right         Nuclear Stress Findings   Isotope administration Rest isotope was administered with an IV injection of 10.7 mCi Tc58m Tetrofosmin. Rest SPECT images were obtained approximately 45 minutes post tracer injection. Stress isotope was administered with an IV injection of 30.6 mCi Tc40m Tetrofosmin Stress SPECT images were obtained approximately 60 minutes post tracer injection.  Nuclear Study Quality Overall image quality is good. There is no nuclear artifact present.  Nuclear Measurements Study was gated.  Rest Perfusion Rest perfusion normal.  Stress Perfusion Stress perfusion abnormal. There is a defect present in the mid anterior, mid anteroseptal, apical anterior and apical septal location.  Perfusion Summary Defect 1:  There is a large defect of moderate severity present in the mid anterior, mid anteroseptal, apical anterior and apical septal location. The defect is reversible. Large area of moderate severity ischemia on stress imaging from mid to apical anterior and anteroseptal walls concerning for ischemia.  Overall Study Impression Myocardial perfusion is abnormal.  Findings consistent with ischemia. This is a high risk study. Overall left ventricular systolic function was abnormal. LV cavity size is normal. Nuclear stress EF: 48%. The left ventricular ejection fraction is mildly decreased (45-54%). There is no prior study for  comparison.  From: ACCF/SCAI/STS/AATS/AHA/ASNC/HFSA/SCCT 2012 Appropriate Use Criteria for Coronary Revascularization Focused Update  Wall Scoring   Hypokinesis of mid to apical anterior and anteroseptal walls, otherwise normal.        Resulted by:   Signed Date/Time  Phone Pager  Jodelle Red 04/28/2018 4:14 PM [phone]   Report approved and finalized on 04/28/2018 1614     Recent Radiology Findings:   No results found.   I have independently reviewed the above radiologic studies and discussed with the patient   Recent Lab Findings: Lab Results  Component Value Date   WBC 8.1 04/28/2018   HGB 15.2 04/28/2018   HCT 46.5 04/28/2018   PLT 341 04/28/2018   GLUCOSE 85 04/28/2018   CHOL 270 (H) 03/04/2015   TRIG 217.0 (H) 03/04/2015   HDL 40.80 03/04/2015   LDLDIRECT 182.0 03/04/2015   LDLCALC 205 (H) 05/23/2013   ALT 18 04/07/2015   AST 20 04/07/2015   NA 137 04/28/2018   K 4.4 04/28/2018   CL 96 04/28/2018   CREATININE 0.94 04/28/2018   BUN 11 04/28/2018   CO2 23 04/28/2018   TSH 1.14 03/04/2015   HGBA1C 5.3 03/04/2015      Assessment / Plan:    Very pleasant 43 year old male with a 2 to 3-year history of exertional chest pain associated with shortness of breath.  He was recently diagnosed but the cardiologist with class III angina and was proven to have significant reversible ischemic defect in the distribution of the LAD on Lexiscan Myoview.  Left heart catheterization performed earlier today demonstrates severe three-vessel coronary artery disease with critical left main coronary artery stenosis with lesions as described above in the HPI.  Surgical coronary revascularization is indicated for  survival benefit and relief of symptoms.  The procedure was described to the patient and his questions were answered.  We have offered to schedule surgery tomorrow and he has agreed to proceed. We will work toward completing preoperative evaluation including echocardiography and a carotid duplex scan.  Would avoid ACE inhibitors for renal protection in anticipation of surgery tomorrow.  I have discontinued his lisinopril.  He is currently on a nitroglycerin drip and has orders to be started on a heparin infusion once his radial arteriotomy is hemostatic.    I  spent 25 minutes counseling the patient face to face.   Leary Roca, PA-C 05/03/2018 1:53 PM  I have seen and examined Mr. Simon personally. He is a 43 yo man with a history of hypertension, hyperlipidemia and sleep apnea. No significant family history of premature CAD. Has been having exertional chest pain for " a couple of years." Recently has gotten worse. Squeezing, burning pain that radiates to jaw and both arms. Cath shows left main and 3 vessel CAD. Normal LV function with no significant valve pathology. CABG indicated for survival benefit and relief of symptoms.   I discussed the general nature of the procedure, the need for general anesthesia, the use of cardiopulmonary bypass, and the incisions to be used with Mr. Riling. We discussed the expected hospital stay, overall recovery and short and long term outcomes. I informed him of the indications, risks, benefits and alternatives. He understands the risks include, but are not limited to death, stroke, MI, DVT/PE, bleeding, possible need for transfusion, infections, cardiac arrhythmias, as well as other organ system dysfunction including respiratory, renal, or GI complications.   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 Asymptomatic. Best option is probably to  proceed with CABG given the urgency (pain during cath). I have asked Dr. Arbie Cookey from Vascular surgery  to consult.  He accepts the risks and agrees to proceed.  Salvatore Decent Dorris Fetch, MD [facility] [phone]

## 2018-05-04 ENCOUNTER — Inpatient Hospital Stay (HOSPITAL_COMMUNITY): Payer: Managed Care, Other (non HMO) | Admitting: Certified Registered Nurse Anesthetist

## 2018-05-04 ENCOUNTER — Inpatient Hospital Stay (HOSPITAL_COMMUNITY): Payer: Managed Care, Other (non HMO)

## 2018-05-04 ENCOUNTER — Encounter (HOSPITAL_COMMUNITY)
Admission: AD | Disposition: A | Discharge status: Home Health | Payer: Self-pay | Source: Home / Self Care | Attending: Thoracic Surgery (Cardiothoracic Vascular Surgery)

## 2018-05-04 DIAGNOSIS — Z951 Presence of aortocoronary bypass graft: Secondary | ICD-10-CM

## 2018-05-04 DIAGNOSIS — I25110 Atherosclerotic heart disease of native coronary artery with unstable angina pectoris: Secondary | ICD-10-CM

## 2018-05-04 HISTORY — PX: CORONARY ARTERY BYPASS GRAFT: SHX141

## 2018-05-04 HISTORY — PX: TEE WITHOUT CARDIOVERSION: SHX5443

## 2018-05-04 LAB — CBC
HCT: 37.7 % — ABNORMAL LOW (ref 39.0–52.0)
HCT: 35.2 % — ABNORMAL LOW (ref 39.0–52.0)
HCT: 34.9 % — ABNORMAL LOW (ref 39.0–52.0)
Hemoglobin: 12.4 g/dL — ABNORMAL LOW (ref 13.0–17.0)
Hemoglobin: 11.7 g/dL — ABNORMAL LOW (ref 13.0–17.0)
Hemoglobin: 11.4 g/dL — ABNORMAL LOW (ref 13.0–17.0)
MCH: 29.2 pg (ref 26.0–34.0)
MCH: 29.5 pg (ref 26.0–34.0)
MCH: 29.5 pg (ref 26.0–34.0)
MCHC: 32.9 g/dL (ref 30.0–36.0)
MCHC: 33.2 g/dL (ref 30.0–36.0)
MCHC: 32.7 g/dL (ref 30.0–36.0)
MCV: 88.9 fL (ref 80.0–100.0)
MCV: 88.9 fL (ref 80.0–100.0)
MCV: 90.4 fL (ref 80.0–100.0)
NRBC: 0 % (ref 0.0–0.2)
Platelets: 239 10*3/uL (ref 150–400)
Platelets: 191 10*3/uL (ref 150–400)
Platelets: 211 10*3/uL (ref 150–400)
RBC: 4.24 MIL/uL (ref 4.22–5.81)
RBC: 3.96 MIL/uL — ABNORMAL LOW (ref 4.22–5.81)
RBC: 3.86 MIL/uL — AB (ref 4.22–5.81)
RDW: 13.5 % (ref 11.5–15.5)
RDW: 12.9 % (ref 11.5–15.5)
RDW: 13.2 % (ref 11.5–15.5)
WBC: 9.7 10*3/uL (ref 4.0–10.5)
WBC: 20.3 10*3/uL — ABNORMAL HIGH (ref 4.0–10.5)
WBC: 15.7 10*3/uL — ABNORMAL HIGH (ref 4.0–10.5)
nRBC: 0 % (ref 0.0–0.2)
nRBC: 0 % (ref 0.0–0.2)

## 2018-05-04 LAB — BASIC METABOLIC PANEL
ANION GAP: 9 (ref 5–15)
Anion gap: 7 (ref 5–15)
BUN: 13 mg/dL (ref 6–20)
BUN: 9 mg/dL (ref 6–20)
CO2: 23 mmol/L (ref 22–32)
CO2: 19 mmol/L — ABNORMAL LOW (ref 22–32)
Calcium: 8.4 mg/dL — ABNORMAL LOW (ref 8.9–10.3)
Calcium: 7.9 mg/dL — ABNORMAL LOW (ref 8.9–10.3)
Chloride: 103 mmol/L (ref 98–111)
Chloride: 109 mmol/L (ref 98–111)
Creatinine, Ser: 1.06 mg/dL (ref 0.61–1.24)
Creatinine, Ser: 0.88 mg/dL (ref 0.61–1.24)
GFR calc Af Amer: >60 mL/min (ref >60)
GFR calc Af Amer: >60 mL/min (ref >60)
GFR calc non Af Amer: >60 mL/min (ref >60)
Glucose, Bld: 97 mg/dL (ref 70–99)
Glucose, Bld: 139 mg/dL — ABNORMAL HIGH (ref 70–99)
Potassium: 3.9 mmol/L (ref 3.5–5.1)
Potassium: 4.6 mmol/L (ref 3.5–5.1)
Sodium: 135 mmol/L (ref 135–145)
Sodium: 135 mmol/L (ref 135–145)

## 2018-05-04 LAB — ECHO INTRAOPERATIVE TEE
Height: 71 in
Weight: 4056.46 oz

## 2018-05-04 LAB — POCT I-STAT 4, (NA,K, GLUC, HGB,HCT)
Glucose, Bld: 91 mg/dL (ref 70–99)
Glucose, Bld: 102 mg/dL — ABNORMAL HIGH (ref 70–99)
Glucose, Bld: 134 mg/dL — ABNORMAL HIGH (ref 70–99)
Glucose, Bld: 169 mg/dL — ABNORMAL HIGH (ref 70–99)
Glucose, Bld: 147 mg/dL — ABNORMAL HIGH (ref 70–99)
HCT: 36 % — ABNORMAL LOW (ref 39.0–52.0)
HCT: 27 % — ABNORMAL LOW (ref 39.0–52.0)
HCT: 33 % — ABNORMAL LOW (ref 39.0–52.0)
HCT: 26 % — ABNORMAL LOW (ref 39.0–52.0)
HCT: 28 % — ABNORMAL LOW (ref 39.0–52.0)
Hemoglobin: 12.2 g/dL — ABNORMAL LOW (ref 13.0–17.0)
Hemoglobin: 11.2 g/dL — ABNORMAL LOW (ref 13.0–17.0)
Hemoglobin: 9.2 g/dL — ABNORMAL LOW (ref 13.0–17.0)
Hemoglobin: 8.8 g/dL — ABNORMAL LOW (ref 13.0–17.0)
Hemoglobin: 9.5 g/dL — ABNORMAL LOW (ref 13.0–17.0)
Potassium: 4 mmol/L (ref 3.5–5.1)
Potassium: 4.6 mmol/L (ref 3.5–5.1)
Potassium: 4.8 mmol/L (ref 3.5–5.1)
Potassium: 4.8 mmol/L (ref 3.5–5.1)
Potassium: 4.8 mmol/L (ref 3.5–5.1)
Sodium: 138 mmol/L (ref 135–145)
Sodium: 136 mmol/L (ref 135–145)
Sodium: 137 mmol/L (ref 135–145)
Sodium: 134 mmol/L — ABNORMAL LOW (ref 135–145)
Sodium: 136 mmol/L (ref 135–145)

## 2018-05-04 LAB — POCT I-STAT 7, (LYTES, BLD GAS, ICA,H+H)
Acid-base deficit: 1 mmol/L (ref 0.0–2.0)
Acid-base deficit: 3 mmol/L — ABNORMAL HIGH (ref 0.0–2.0)
Acid-base deficit: 4 mmol/L — ABNORMAL HIGH (ref 0.0–2.0)
Acid-base deficit: 4 mmol/L — ABNORMAL HIGH (ref 0.0–2.0)
Bicarbonate: 24 mmol/L (ref 20.0–28.0)
Bicarbonate: 22.6 mmol/L (ref 20.0–28.0)
Bicarbonate: 21.4 mmol/L (ref 20.0–28.0)
Bicarbonate: 21.4 mmol/L (ref 20.0–28.0)
CALCIUM ION: 1.12 mmol/L — AB (ref 1.15–1.40)
Calcium, Ion: 1.03 mmol/L — ABNORMAL LOW (ref 1.15–1.40)
Calcium, Ion: 1.1 mmol/L — ABNORMAL LOW (ref 1.15–1.40)
Calcium, Ion: 1.08 mmol/L — ABNORMAL LOW (ref 1.15–1.40)
HCT: 34 % — ABNORMAL LOW (ref 39.0–52.0)
HCT: 34 % — ABNORMAL LOW (ref 39.0–52.0)
HEMATOCRIT: 29 % — AB (ref 39.0–52.0)
HEMATOCRIT: 32 % — AB (ref 39.0–52.0)
HEMOGLOBIN: 11.6 g/dL — AB (ref 13.0–17.0)
HEMOGLOBIN: 11.6 g/dL — AB (ref 13.0–17.0)
Hemoglobin: 9.9 g/dL — ABNORMAL LOW (ref 13.0–17.0)
Hemoglobin: 10.9 g/dL — ABNORMAL LOW (ref 13.0–17.0)
O2 SAT: 100 %
O2 Saturation: 96 %
O2 Saturation: 97 %
O2 Saturation: 96 %
POTASSIUM: 4.7 mmol/L (ref 3.5–5.1)
POTASSIUM: 4.4 mmol/L (ref 3.5–5.1)
Patient temperature: 36.2
Patient temperature: 37.7
Potassium: 4.4 mmol/L (ref 3.5–5.1)
Potassium: 4.6 mmol/L (ref 3.5–5.1)
Sodium: 138 mmol/L (ref 135–145)
Sodium: 137 mmol/L (ref 135–145)
Sodium: 137 mmol/L (ref 135–145)
Sodium: 136 mmol/L (ref 135–145)
TCO2: 25 mmol/L (ref 22–32)
TCO2: 24 mmol/L (ref 22–32)
TCO2: 23 mmol/L (ref 22–32)
TCO2: 22 mmol/L (ref 22–32)
pCO2 arterial: 42.6 mmHg (ref 32.0–48.0)
pCO2 arterial: 42.3 mmHg (ref 32.0–48.0)
pCO2 arterial: 39.4 mmHg (ref 32.0–48.0)
pCO2 arterial: 38.1 mmHg (ref 32.0–48.0)
pH, Arterial: 7.358 (ref 7.350–7.450)
pH, Arterial: 7.332 — ABNORMAL LOW (ref 7.350–7.450)
pH, Arterial: 7.346 — ABNORMAL LOW (ref 7.350–7.450)
pH, Arterial: 7.36 (ref 7.350–7.450)
pO2, Arterial: 325 mmHg — ABNORMAL HIGH (ref 83.0–108.0)
pO2, Arterial: 86 mmHg (ref 83.0–108.0)
pO2, Arterial: 97 mmHg (ref 83.0–108.0)
pO2, Arterial: 88 mmHg (ref 83.0–108.0)

## 2018-05-04 LAB — GLUCOSE, CAPILLARY
Glucose-Capillary: 128 mg/dL — ABNORMAL HIGH (ref 70–99)
Glucose-Capillary: 100 mg/dL — ABNORMAL HIGH (ref 70–99)
Glucose-Capillary: 107 mg/dL — ABNORMAL HIGH (ref 70–99)
Glucose-Capillary: 119 mg/dL — ABNORMAL HIGH (ref 70–99)
Glucose-Capillary: 124 mg/dL — ABNORMAL HIGH (ref 70–99)
Glucose-Capillary: 114 mg/dL — ABNORMAL HIGH (ref 70–99)
Glucose-Capillary: 134 mg/dL — ABNORMAL HIGH (ref 70–99)
Glucose-Capillary: 121 mg/dL — ABNORMAL HIGH (ref 70–99)

## 2018-05-04 LAB — PROTIME-INR
INR: 1.5 — ABNORMAL HIGH (ref 0.8–1.2)
Prothrombin Time: 17.6 seconds — ABNORMAL HIGH (ref 11.4–15.2)

## 2018-05-04 LAB — PLATELET COUNT: Platelets: 230 10*3/uL (ref 150–400)

## 2018-05-04 LAB — HEPARIN LEVEL (UNFRACTIONATED): Heparin Unfractionated: 0.18 IU/mL — ABNORMAL LOW (ref 0.30–0.70)

## 2018-05-04 LAB — HEMOGLOBIN AND HEMATOCRIT, BLOOD
HCT: 27.6 % — ABNORMAL LOW (ref 39.0–52.0)
Hemoglobin: 9.4 g/dL — ABNORMAL LOW (ref 13.0–17.0)

## 2018-05-04 LAB — APTT: aPTT: 32 seconds (ref 24–36)

## 2018-05-04 LAB — MAGNESIUM: Magnesium: 3 mg/dL — ABNORMAL HIGH (ref 1.7–2.4)

## 2018-05-04 SURGERY — CORONARY ARTERY BYPASS GRAFTING (CABG)
Anesthesia: General | Site: Chest

## 2018-05-04 MED ORDER — PROTAMINE SULFATE 10 MG/ML IV SOLN
INTRAVENOUS | Status: AC
  Filled 2018-05-04: qty 5

## 2018-05-04 MED ORDER — SODIUM CHLORIDE 0.9 % IV SOLN
INTRAVENOUS | Status: DC | PRN
  Administered 2018-05-04: 30 ug/min via INTRAVENOUS

## 2018-05-04 MED ORDER — METOPROLOL TARTRATE 12.5 MG HALF TABLET
12.5000 mg | ORAL_TABLET | Freq: Two times a day (BID) | ORAL | Status: DC
  Administered 2018-05-05 – 2018-05-06 (×3): 12.5 mg via ORAL
  Filled 2018-05-04 (×3): qty 1

## 2018-05-04 MED ORDER — 0.9 % SODIUM CHLORIDE (POUR BTL) OPTIME
TOPICAL | Status: DC | PRN
  Administered 2018-05-04: 6000 mL

## 2018-05-04 MED ORDER — SODIUM CHLORIDE 0.9 % IV SOLN
INTRAVENOUS | Status: DC | PRN
  Administered 2018-05-04: 13:00:00 via INTRAVENOUS

## 2018-05-04 MED ORDER — ONDANSETRON HCL 4 MG/2ML IJ SOLN
4.0000 mg | Freq: Four times a day (QID) | INTRAMUSCULAR | Status: DC | PRN
  Administered 2018-05-05 – 2018-05-06 (×2): 4 mg via INTRAVENOUS
  Filled 2018-05-04 (×3): qty 2

## 2018-05-04 MED ORDER — MORPHINE SULFATE (PF) 2 MG/ML IV SOLN
1.0000 mg | INTRAVENOUS | Status: DC | PRN
  Administered 2018-05-04 – 2018-05-05 (×2): 2 mg via INTRAVENOUS
  Administered 2018-05-05 (×3): 4 mg via INTRAVENOUS
  Administered 2018-05-05: 2 mg via INTRAVENOUS
  Administered 2018-05-05 (×3): 4 mg via INTRAVENOUS
  Administered 2018-05-05 (×2): 2 mg via INTRAVENOUS
  Filled 2018-05-04: qty 1
  Filled 2018-05-04 (×2): qty 2
  Filled 2018-05-04 (×3): qty 1
  Filled 2018-05-04: qty 2
  Filled 2018-05-04: qty 1
  Filled 2018-05-04 (×3): qty 2
  Filled 2018-05-04: qty 1

## 2018-05-04 MED ORDER — PROTAMINE SULFATE 10 MG/ML IV SOLN
INTRAVENOUS | Status: AC
  Filled 2018-05-04: qty 25

## 2018-05-04 MED ORDER — LACTATED RINGERS IV SOLN
INTRAVENOUS | Status: DC | PRN
  Administered 2018-05-04: 07:00:00 via INTRAVENOUS

## 2018-05-04 MED ORDER — CHLORHEXIDINE GLUCONATE CLOTH 2 % EX PADS
6.0000 | MEDICATED_PAD | Freq: Every day | CUTANEOUS | Status: DC
  Administered 2018-05-04: 6 via TOPICAL

## 2018-05-04 MED ORDER — LACTATED RINGERS IV SOLN: INTRAVENOUS | Status: DC

## 2018-05-04 MED ORDER — LACTATED RINGERS IV SOLN: 500.0000 mL | Freq: Once | INTRAVENOUS | Status: DC | PRN

## 2018-05-04 MED ORDER — LACTATED RINGERS IV SOLN
INTRAVENOUS | Status: DC | PRN
  Administered 2018-05-04 (×2): via INTRAVENOUS

## 2018-05-04 MED ORDER — PROTAMINE SULFATE 10 MG/ML IV SOLN
INTRAVENOUS | Status: DC | PRN
  Administered 2018-05-04: 350 mg via INTRAVENOUS

## 2018-05-04 MED ORDER — PHENYLEPHRINE 40 MCG/ML (10ML) SYRINGE FOR IV PUSH (FOR BLOOD PRESSURE SUPPORT)
PREFILLED_SYRINGE | INTRAVENOUS | Status: AC
  Filled 2018-05-04: qty 10

## 2018-05-04 MED ORDER — VANCOMYCIN HCL IN DEXTROSE 1-5 GM/200ML-% IV SOLN
1000.0000 mg | Freq: Once | INTRAVENOUS | Status: AC
  Administered 2018-05-04: 1000 mg via INTRAVENOUS
  Filled 2018-05-04: qty 200

## 2018-05-04 MED ORDER — METOPROLOL TARTRATE 25 MG/10 ML ORAL SUSPENSION: 12.5000 mg | Freq: Two times a day (BID) | ORAL | Status: DC

## 2018-05-04 MED ORDER — ASPIRIN 81 MG PO CHEW: 324.0000 mg | CHEWABLE_TABLET | Freq: Every day | ORAL | Status: DC

## 2018-05-04 MED ORDER — ACETAMINOPHEN 500 MG PO TABS
1000.0000 mg | ORAL_TABLET | Freq: Four times a day (QID) | ORAL | Status: DC
  Administered 2018-05-05 – 2018-05-06 (×5): 1000 mg via ORAL
  Filled 2018-05-04 (×5): qty 2

## 2018-05-04 MED ORDER — FAMOTIDINE IN NACL 20-0.9 MG/50ML-% IV SOLN
20.0000 mg | Freq: Two times a day (BID) | INTRAVENOUS | Status: AC
  Administered 2018-05-04 (×2): 20 mg via INTRAVENOUS
  Filled 2018-05-04: qty 50

## 2018-05-04 MED ORDER — ORAL CARE MOUTH RINSE
15.0000 mL | OROMUCOSAL | Status: DC
  Administered 2018-05-04 (×2): 15 mL via OROMUCOSAL

## 2018-05-04 MED ORDER — FENTANYL CITRATE (PF) 250 MCG/5ML IJ SOLN
INTRAMUSCULAR | Status: AC
  Filled 2018-05-04: qty 5

## 2018-05-04 MED ORDER — ALBUMIN HUMAN 5 % IV SOLN
250.0000 mL | INTRAVENOUS | Status: DC | PRN
  Administered 2018-05-04 (×2): 12.5 g via INTRAVENOUS
  Filled 2018-05-04: qty 250

## 2018-05-04 MED ORDER — HEPARIN SODIUM (PORCINE) 1000 UNIT/ML IJ SOLN
INTRAMUSCULAR | Status: AC
  Filled 2018-05-04: qty 1

## 2018-05-04 MED ORDER — PROPOFOL 10 MG/ML IV BOLUS
INTRAVENOUS | Status: DC | PRN
  Administered 2018-05-04: 130 mg via INTRAVENOUS

## 2018-05-04 MED ORDER — MIDAZOLAM HCL 2 MG/2ML IJ SOLN: 2.0000 mg | INTRAMUSCULAR | Status: DC | PRN

## 2018-05-04 MED ORDER — ACETAMINOPHEN 160 MG/5ML PO SOLN: 650.0000 mg | Freq: Once | ORAL | Status: AC

## 2018-05-04 MED ORDER — DOPAMINE-DEXTROSE 3.2-5 MG/ML-% IV SOLN: 0.0000 ug/kg/min | INTRAVENOUS | Status: DC

## 2018-05-04 MED ORDER — PANTOPRAZOLE SODIUM 40 MG PO TBEC
40.0000 mg | DELAYED_RELEASE_TABLET | Freq: Every day | ORAL | Status: DC
  Administered 2018-05-06: 40 mg via ORAL
  Filled 2018-05-04: qty 1

## 2018-05-04 MED ORDER — HEMOSTATIC AGENTS (NO CHARGE) OPTIME
TOPICAL | Status: DC | PRN
  Administered 2018-05-04: 1 via TOPICAL

## 2018-05-04 MED ORDER — ROCURONIUM BROMIDE 10 MG/ML (PF) SYRINGE
PREFILLED_SYRINGE | INTRAVENOUS | Status: DC | PRN
  Administered 2018-05-04: 50 mg via INTRAVENOUS
  Administered 2018-05-04 (×2): 100 mg via INTRAVENOUS
  Administered 2018-05-04: 30 mg via INTRAVENOUS

## 2018-05-04 MED ORDER — FENTANYL CITRATE (PF) 250 MCG/5ML IJ SOLN
INTRAMUSCULAR | Status: AC
  Filled 2018-05-04: qty 25

## 2018-05-04 MED ORDER — DOCUSATE SODIUM 100 MG PO CAPS
200.0000 mg | ORAL_CAPSULE | Freq: Every day | ORAL | Status: DC
  Administered 2018-05-06: 200 mg via ORAL
  Filled 2018-05-04 (×2): qty 2

## 2018-05-04 MED ORDER — PROPOFOL 10 MG/ML IV BOLUS
INTRAVENOUS | Status: AC
  Filled 2018-05-04: qty 20

## 2018-05-04 MED ORDER — CHLORHEXIDINE GLUCONATE 0.12% ORAL RINSE (MEDLINE KIT): 15.0000 mL | Freq: Two times a day (BID) | OROMUCOSAL | Status: DC

## 2018-05-04 MED ORDER — ORAL CARE MOUTH RINSE
15.0000 mL | Freq: Two times a day (BID) | OROMUCOSAL | Status: DC
  Administered 2018-05-04 – 2018-05-08 (×4): 15 mL via OROMUCOSAL

## 2018-05-04 MED ORDER — INSULIN REGULAR BOLUS VIA INFUSION
0.0000 [IU] | Freq: Three times a day (TID) | INTRAVENOUS | Status: DC
  Filled 2018-05-04: qty 10

## 2018-05-04 MED ORDER — PHENYLEPHRINE HCL-NACL 20-0.9 MG/250ML-% IV SOLN
0.0000 ug/min | INTRAVENOUS | Status: DC
  Administered 2018-05-04: 50 ug/min via INTRAVENOUS
  Filled 2018-05-04 (×2): qty 250

## 2018-05-04 MED ORDER — SODIUM CHLORIDE 0.45 % IV SOLN: INTRAVENOUS | Status: DC | PRN

## 2018-05-04 MED ORDER — NITROGLYCERIN IN D5W 200-5 MCG/ML-% IV SOLN: 0.0000 ug/min | INTRAVENOUS | Status: DC

## 2018-05-04 MED ORDER — HEPARIN SODIUM (PORCINE) 1000 UNIT/ML IJ SOLN
INTRAMUSCULAR | Status: DC | PRN
  Administered 2018-05-04: 36000 [IU] via INTRAVENOUS
  Administered 2018-05-04: 2000 [IU] via INTRAVENOUS

## 2018-05-04 MED ORDER — LIDOCAINE 2% (20 MG/ML) 5 ML SYRINGE
INTRAMUSCULAR | Status: AC
  Filled 2018-05-04: qty 5

## 2018-05-04 MED ORDER — PLASMA-LYTE 148 IV SOLN
INTRAVENOUS | Status: AC
  Administered 2018-05-04: 500 mL
  Filled 2018-05-04: qty 2.5

## 2018-05-04 MED ORDER — SODIUM CHLORIDE 0.9% FLUSH: 10.0000 mL | INTRAVENOUS | Status: DC | PRN

## 2018-05-04 MED ORDER — ROCURONIUM BROMIDE 50 MG/5ML IV SOSY
PREFILLED_SYRINGE | INTRAVENOUS | Status: AC
  Filled 2018-05-04: qty 5

## 2018-05-04 MED ORDER — EPHEDRINE 5 MG/ML INJ
INTRAVENOUS | Status: AC
  Filled 2018-05-04: qty 10

## 2018-05-04 MED ORDER — MAGNESIUM SULFATE 4 GM/100ML IV SOLN
4.0000 g | Freq: Once | INTRAVENOUS | Status: AC
  Administered 2018-05-04: 4 g via INTRAVENOUS
  Filled 2018-05-04: qty 100

## 2018-05-04 MED ORDER — ROCURONIUM BROMIDE 50 MG/5ML IV SOSY
PREFILLED_SYRINGE | INTRAVENOUS | Status: AC
  Filled 2018-05-04: qty 25

## 2018-05-04 MED ORDER — OXYCODONE HCL 5 MG PO TABS
5.0000 mg | ORAL_TABLET | ORAL | Status: DC | PRN
  Administered 2018-05-04: 10 mg via ORAL
  Administered 2018-05-05: 5 mg via ORAL
  Administered 2018-05-05: 10 mg via ORAL
  Administered 2018-05-05: 5 mg via ORAL
  Administered 2018-05-05 – 2018-05-06 (×2): 10 mg via ORAL
  Administered 2018-05-06: 5 mg via ORAL
  Filled 2018-05-04 (×2): qty 1
  Filled 2018-05-04 (×3): qty 2
  Filled 2018-05-04: qty 1
  Filled 2018-05-04: qty 2
  Filled 2018-05-04: qty 1

## 2018-05-04 MED ORDER — SODIUM CHLORIDE 0.9% FLUSH
3.0000 mL | INTRAVENOUS | Status: DC | PRN
  Administered 2018-05-05: 3 mL via INTRAVENOUS
  Filled 2018-05-04: qty 3

## 2018-05-04 MED ORDER — SODIUM CHLORIDE 0.9 % IV SOLN
INTRAVENOUS | Status: DC
  Administered 2018-05-04: 14:00:00 via INTRAVENOUS

## 2018-05-04 MED ORDER — BISACODYL 5 MG PO TBEC
10.0000 mg | DELAYED_RELEASE_TABLET | Freq: Every day | ORAL | Status: DC
  Administered 2018-05-06: 10 mg via ORAL
  Filled 2018-05-04 (×2): qty 2

## 2018-05-04 MED ORDER — FENTANYL CITRATE (PF) 250 MCG/5ML IJ SOLN
INTRAMUSCULAR | Status: DC | PRN
  Administered 2018-05-04: 1150 ug via INTRAVENOUS
  Administered 2018-05-04 (×5): 50 ug via INTRAVENOUS
  Administered 2018-05-04: 150 ug via INTRAVENOUS

## 2018-05-04 MED ORDER — INSULIN REGULAR(HUMAN) IN NACL 100-0.9 UT/100ML-% IV SOLN: INTRAVENOUS | Status: DC

## 2018-05-04 MED ORDER — MIDAZOLAM HCL 5 MG/5ML IJ SOLN
INTRAMUSCULAR | Status: DC | PRN
  Administered 2018-05-04: 2 mg via INTRAVENOUS
  Administered 2018-05-04 (×2): 1 mg via INTRAVENOUS
  Administered 2018-05-04: 2 mg via INTRAVENOUS
  Administered 2018-05-04 (×2): 1 mg via INTRAVENOUS
  Administered 2018-05-04: 2 mg via INTRAVENOUS

## 2018-05-04 MED ORDER — ONDANSETRON HCL 4 MG/2ML IJ SOLN
INTRAMUSCULAR | Status: AC
  Filled 2018-05-04: qty 2

## 2018-05-04 MED ORDER — SODIUM CHLORIDE 0.9 % IV SOLN
1.5000 g | Freq: Two times a day (BID) | INTRAVENOUS | Status: AC
  Administered 2018-05-04 – 2018-05-06 (×4): 1.5 g via INTRAVENOUS
  Filled 2018-05-04 (×4): qty 1.5

## 2018-05-04 MED ORDER — ACETAMINOPHEN 650 MG RE SUPP
650.0000 mg | Freq: Once | RECTAL | Status: AC
  Administered 2018-05-04: 650 mg via RECTAL

## 2018-05-04 MED ORDER — METOPROLOL TARTRATE 5 MG/5ML IV SOLN: 2.5000 mg | INTRAVENOUS | Status: DC | PRN

## 2018-05-04 MED ORDER — SODIUM CHLORIDE 0.9 % IV SOLN
INTRAVENOUS | Status: DC | PRN
  Administered 2018-05-04: 750 mg via INTRAVENOUS

## 2018-05-04 MED ORDER — DEXMEDETOMIDINE HCL IN NACL 200 MCG/50ML IV SOLN
0.0000 ug/kg/h | INTRAVENOUS | Status: DC
  Administered 2018-05-04: 0.4 ug/kg/h via INTRAVENOUS
  Filled 2018-05-04: qty 50

## 2018-05-04 MED ORDER — LIDOCAINE 2% (20 MG/ML) 5 ML SYRINGE
INTRAMUSCULAR | Status: DC | PRN
  Administered 2018-05-04: 100 mg via INTRAVENOUS

## 2018-05-04 MED ORDER — MIDAZOLAM HCL (PF) 10 MG/2ML IJ SOLN
INTRAMUSCULAR | Status: AC
  Filled 2018-05-04: qty 2

## 2018-05-04 MED ORDER — SODIUM CHLORIDE 0.9 % IV SOLN: 250.0000 mL | INTRAVENOUS | Status: DC

## 2018-05-04 MED ORDER — SODIUM CHLORIDE 0.9% FLUSH
10.0000 mL | Freq: Two times a day (BID) | INTRAVENOUS | Status: DC
  Administered 2018-05-04 – 2018-05-06 (×5): 10 mL

## 2018-05-04 MED ORDER — ALBUMIN HUMAN 5 % IV SOLN
INTRAVENOUS | Status: DC | PRN
  Administered 2018-05-04 (×2): via INTRAVENOUS

## 2018-05-04 MED ORDER — SODIUM CHLORIDE 0.9% FLUSH
3.0000 mL | Freq: Two times a day (BID) | INTRAVENOUS | Status: DC
  Administered 2018-05-05 – 2018-05-06 (×3): 3 mL via INTRAVENOUS

## 2018-05-04 MED ORDER — BISACODYL 10 MG RE SUPP: 10.0000 mg | Freq: Every day | RECTAL | Status: DC

## 2018-05-04 MED ORDER — TRAMADOL HCL 50 MG PO TABS
50.0000 mg | ORAL_TABLET | ORAL | Status: DC | PRN
  Administered 2018-05-04 – 2018-05-05 (×2): 50 mg via ORAL
  Filled 2018-05-04 (×2): qty 1

## 2018-05-04 MED ORDER — ASPIRIN EC 325 MG PO TBEC
325.0000 mg | DELAYED_RELEASE_TABLET | Freq: Every day | ORAL | Status: DC
  Administered 2018-05-06: 325 mg via ORAL
  Filled 2018-05-04 (×2): qty 1

## 2018-05-04 MED ORDER — ACETAMINOPHEN 160 MG/5ML PO SOLN: 1000.0000 mg | Freq: Four times a day (QID) | ORAL | Status: DC

## 2018-05-04 MED ORDER — CHLORHEXIDINE GLUCONATE 0.12 % MT SOLN
15.0000 mL | OROMUCOSAL | Status: AC
  Administered 2018-05-04: 15 mL via OROMUCOSAL

## 2018-05-04 MED ORDER — POTASSIUM CHLORIDE 10 MEQ/50ML IV SOLN: 10.0000 meq | INTRAVENOUS | Status: AC

## 2018-05-04 MED ORDER — SODIUM CHLORIDE (PF) 0.9 % IJ SOLN
OROMUCOSAL | Status: DC | PRN
  Administered 2018-05-04: 4 mL via TOPICAL

## 2018-05-04 SURGICAL SUPPLY — 110 items
ADH SKN CLS APL DERMABOND .7 (GAUZE/BANDAGES/DRESSINGS) ×3
APPLIER CLIP 9.375 SM OPEN (CLIP)
APR CLP SM 9.3 20 MLT OPN (CLIP)
BAG DECANTER FOR FLEXI CONT (MISCELLANEOUS) ×4 IMPLANT
BANDAGE ACE 4X5 VEL STRL LF (GAUZE/BANDAGES/DRESSINGS) ×4 IMPLANT
BANDAGE ACE 6X5 VEL STRL LF (GAUZE/BANDAGES/DRESSINGS) ×4 IMPLANT
BANDAGE ELASTIC 4 VELCRO ST LF (GAUZE/BANDAGES/DRESSINGS) ×2 IMPLANT
BANDAGE ELASTIC 6 VELCRO ST LF (GAUZE/BANDAGES/DRESSINGS) ×2 IMPLANT
BASKET HEART (ORDER IN 25'S) (MISCELLANEOUS) ×1
BASKET HEART (ORDER IN 25S) (MISCELLANEOUS) ×3 IMPLANT
BLADE STERNUM SYSTEM 6 (BLADE) ×4 IMPLANT
BLADE SURG 11 STRL SS (BLADE) ×2 IMPLANT
BLADE SURG 15 STRL LF DISP TIS (BLADE) ×1 IMPLANT
BLADE SURG 15 STRL SS (BLADE) ×4
BNDG GAUZE ELAST 4 BULKY (GAUZE/BANDAGES/DRESSINGS) ×4 IMPLANT
CANISTER SUCT 3000ML PPV (MISCELLANEOUS) ×4 IMPLANT
CANNULA EZ GLIDE AORTIC 21FR (CANNULA) ×4 IMPLANT
CATH CPB KIT HENDRICKSON (MISCELLANEOUS) ×4 IMPLANT
CATH ROBINSON RED A/P 18FR (CATHETERS) ×4 IMPLANT
CATH THORACIC 36FR (CATHETERS) ×4 IMPLANT
CATH THORACIC 36FR RT ANG (CATHETERS) ×6 IMPLANT
CLIP APPLIE 9.375 SM OPEN (CLIP) IMPLANT
CLIP FOGARTY SPRING 6M (CLIP) ×2 IMPLANT
CLIP VESOCCLUDE MED 24/CT (CLIP) ×2 IMPLANT
CLIP VESOCCLUDE SM WIDE 24/CT (CLIP) ×10 IMPLANT
COVER MAYO STAND STRL (DRAPES) ×2 IMPLANT
COVER WAND RF STERILE (DRAPES) ×2 IMPLANT
CRADLE DONUT ADULT HEAD (MISCELLANEOUS) ×4 IMPLANT
DERMABOND ADVANCED (GAUZE/BANDAGES/DRESSINGS) ×1
DERMABOND ADVANCED .7 DNX12 (GAUZE/BANDAGES/DRESSINGS) ×1 IMPLANT
DRAPE CARDIOVASCULAR INCISE (DRAPES) ×4
DRAPE HALF SHEET 40X57 (DRAPES) IMPLANT
DRAPE SLUSH/WARMER DISC (DRAPES) ×4 IMPLANT
DRAPE SRG 135X102X78XABS (DRAPES) ×3 IMPLANT
DRSG AQUACEL AG ADV 3.5X14 (GAUZE/BANDAGES/DRESSINGS) ×2 IMPLANT
DRSG COVADERM 4X14 (GAUZE/BANDAGES/DRESSINGS) ×4 IMPLANT
ELECT REM PT RETURN 9FT ADLT (ELECTROSURGICAL) ×8
ELECTRODE REM PT RTRN 9FT ADLT (ELECTROSURGICAL) ×6 IMPLANT
FELT TEFLON 1X6 (MISCELLANEOUS) ×6 IMPLANT
GAUZE SPONGE 4X4 12PLY STRL (GAUZE/BANDAGES/DRESSINGS) ×8 IMPLANT
GAUZE SPONGE 4X4 12PLY STRL LF (GAUZE/BANDAGES/DRESSINGS) ×4 IMPLANT
GEL ULTRASOUND 20GR AQUASONIC (MISCELLANEOUS) IMPLANT
GLOVE BIO SURGEON STRL SZ 6 (GLOVE) ×10 IMPLANT
GLOVE BIO SURGEON STRL SZ 6.5 (GLOVE) ×8 IMPLANT
GLOVE BIOGEL PI IND STRL 6 (GLOVE) ×8 IMPLANT
GLOVE BIOGEL PI IND STRL 6.5 (GLOVE) ×6 IMPLANT
GLOVE BIOGEL PI INDICATOR 6 (GLOVE) ×8
GLOVE BIOGEL PI INDICATOR 6.5 (GLOVE) ×6
GLOVE SURG SIGNA 7.5 PF LTX (GLOVE) ×12 IMPLANT
GLOVE SURG SS PI 7.5 STRL IVOR (GLOVE) ×6 IMPLANT
GOWN STRL REUS W/ TWL LRG LVL3 (GOWN DISPOSABLE) ×16 IMPLANT
GOWN STRL REUS W/ TWL XL LVL3 (GOWN DISPOSABLE) ×8 IMPLANT
GOWN STRL REUS W/TWL LRG LVL3 (GOWN DISPOSABLE) ×32
GOWN STRL REUS W/TWL XL LVL3 (GOWN DISPOSABLE) ×16
HARMONIC SHEARS 14CM COAG (MISCELLANEOUS) ×4 IMPLANT
HEMOSTAT POWDER SURGIFOAM 1G (HEMOSTASIS) ×12 IMPLANT
HEMOSTAT SURGICEL 2X14 (HEMOSTASIS) ×4 IMPLANT
INSERT FOGARTY XLG (MISCELLANEOUS) IMPLANT
KIT BASIN OR (CUSTOM PROCEDURE TRAY) ×4 IMPLANT
KIT SUCTION CATH 14FR (SUCTIONS) ×8 IMPLANT
KIT TURNOVER KIT B (KITS) ×4 IMPLANT
KIT VASOVIEW HEMOPRO 2 VH 4000 (KITS) ×4 IMPLANT
MARKER GRAFT CORONARY BYPASS (MISCELLANEOUS) ×12 IMPLANT
NS IRRIG 1000ML POUR BTL (IV SOLUTION) ×22 IMPLANT
PACK E OPEN HEART (SUTURE) ×4 IMPLANT
PACK OPEN HEART (CUSTOM PROCEDURE TRAY) ×4 IMPLANT
PAD ARMBOARD 7.5X6 YLW CONV (MISCELLANEOUS) ×8 IMPLANT
PAD ELECT DEFIB RADIOL ZOLL (MISCELLANEOUS) ×4 IMPLANT
PENCIL BUTTON HOLSTER BLD 10FT (ELECTRODE) ×4 IMPLANT
PUNCH AORTIC ROTATE 4.0MM (MISCELLANEOUS) IMPLANT
PUNCH AORTIC ROTATE 4.5MM 8IN (MISCELLANEOUS) ×2 IMPLANT
PUNCH AORTIC ROTATE 5MM 8IN (MISCELLANEOUS) IMPLANT
SET CARDIOPLEGIA MPS 5001102 (MISCELLANEOUS) ×2 IMPLANT
SHEARS HARMONIC 9CM CVD (BLADE) ×4 IMPLANT
SPONGE LAP 18X18 RF (DISPOSABLE) ×2 IMPLANT
SPONGE LAP 4X18 RFD (DISPOSABLE) ×2 IMPLANT
SUT BONE WAX W31G (SUTURE) ×4 IMPLANT
SUT MNCRL AB 4-0 PS2 18 (SUTURE) ×2 IMPLANT
SUT PROLENE 3 0 SH DA (SUTURE) ×4 IMPLANT
SUT PROLENE 4 0 RB 1 (SUTURE)
SUT PROLENE 4 0 SH DA (SUTURE) IMPLANT
SUT PROLENE 4-0 RB1 .5 CRCL 36 (SUTURE) IMPLANT
SUT PROLENE 6 0 C 1 30 (SUTURE) ×20 IMPLANT
SUT PROLENE 7 0 BV1 MDA (SUTURE) ×6 IMPLANT
SUT PROLENE 8 0 BV175 6 (SUTURE) ×4 IMPLANT
SUT SILK  1 MH (SUTURE) ×1
SUT SILK 1 MH (SUTURE) ×1 IMPLANT
SUT SILK 2 0 SH (SUTURE) ×2 IMPLANT
SUT STEEL 6MS V (SUTURE) ×4 IMPLANT
SUT STEEL STERNAL CCS#1 18IN (SUTURE) IMPLANT
SUT STEEL SZ 6 DBL 3X14 BALL (SUTURE) ×4 IMPLANT
SUT VIC AB 1 CTX 36 (SUTURE) ×8
SUT VIC AB 1 CTX36XBRD ANBCTR (SUTURE) ×6 IMPLANT
SUT VIC AB 2-0 CT1 27 (SUTURE) ×4
SUT VIC AB 2-0 CT1 TAPERPNT 27 (SUTURE) ×1 IMPLANT
SUT VIC AB 2-0 CTX 27 (SUTURE) IMPLANT
SUT VIC AB 3-0 SH 27 (SUTURE)
SUT VIC AB 3-0 SH 27X BRD (SUTURE) IMPLANT
SUT VIC AB 3-0 X1 27 (SUTURE) IMPLANT
SUT VICRYL 4-0 PS2 18IN ABS (SUTURE) IMPLANT
SYR 50ML SLIP (SYRINGE) IMPLANT
SYSTEM SAHARA CHEST DRAIN ATS (WOUND CARE) ×6 IMPLANT
TAPE CLOTH SURG 4X10 WHT LF (GAUZE/BANDAGES/DRESSINGS) ×2 IMPLANT
TAPE PAPER 2X10 WHT MICROPORE (GAUZE/BANDAGES/DRESSINGS) ×2 IMPLANT
TOWEL GREEN STERILE (TOWEL DISPOSABLE) ×4 IMPLANT
TOWEL GREEN STERILE FF (TOWEL DISPOSABLE) ×2 IMPLANT
TRAY FOLEY SLVR 16FR TEMP STAT (SET/KITS/TRAYS/PACK) ×4 IMPLANT
TUBING LAP HI FLOW INSUFFLATIO (TUBING) ×4 IMPLANT
UNDERPAD 30X30 (UNDERPADS AND DIAPERS) ×4 IMPLANT
WATER STERILE IRR 1000ML POUR (IV SOLUTION) ×8 IMPLANT

## 2018-05-04 NOTE — Interval H&P Note (Signed)
History and Physical Interval Note:  05/04/2018 7:48 AM  David Christian  has presented today for surgery, with the diagnosis of CAD  The various methods of treatment have been discussed with the patient and family. After consideration of risks, benefits and other options for treatment, the patient has consented to  Procedure(s) with comments: CORONARY ARTERY BYPASS GRAFTING (CABG) (N/A) - possible bilateral IMA possible RADIAL ARTERY HARVEST (Left) TRANSESOPHAGEAL ECHOCARDIOGRAM (TEE) (N/A) as a surgical intervention .  The patient's history has been reviewed, patient examined, no change in status, stable for surgery.  I have reviewed the patient's chart and labs.  Questions were answered to the patient's satisfaction.     [name]

## 2018-05-04 NOTE — Progress Notes (Signed)
RT NOTE:  PT wears CPAP @ home. Wife brought home machine. Pt does not want to wear tonight.

## 2018-05-04 NOTE — Anesthesia Postprocedure Evaluation (Signed)
Anesthesia Post Note  Patient: David Christian  Procedure(s) Performed: CORONARY ARTERY BYPASS GRAFTING (CABG) x5 using right greater saphenous vein harvested endoscopically and bilateral left internal mammary arteries (N/A Chest) TRANSESOPHAGEAL ECHOCARDIOGRAM (TEE) (N/A )     Patient location during evaluation: SICU Anesthesia Type: General Level of consciousness: sedated Pain management: pain level controlled Vital Signs Assessment: post-procedure vital signs reviewed and stable Respiratory status: patient remains intubated per anesthesia plan Cardiovascular status: stable Postop Assessment: no apparent nausea or vomiting Anesthetic complications: no    Last Vitals:  Vitals:   05/04/18 1619 05/04/18 1630  BP:  98/61  Pulse: 97 98  Resp: 17 18  Temp: 37.2 C 37.2 C  SpO2: 100% 100%    Last Pain:  Vitals:   05/04/18 1405  TempSrc: Core (Comment)  PainSc:                  David Christian DAVID

## 2018-05-04 NOTE — Transfer of Care (Signed)
Immediate Anesthesia Transfer of Care Note  Patient: GLENDA MCLOONE  Procedure(s) Performed: CORONARY ARTERY BYPASS GRAFTING (CABG) x5 using right greater saphenous vein harvested endoscopically and bilateral left internal mammary arteries (N/A Chest) TRANSESOPHAGEAL ECHOCARDIOGRAM (TEE) (N/A )  Patient Location: PACU  Anesthesia Type:General  Level of Consciousness: Patient remains intubated per anesthesia plan  Airway & Oxygen Therapy: Patient remains intubated per anesthesia plan and Patient placed on Ventilator (see vital sign flow sheet for setting)  Post-op Assessment: Report given to RN and Post -op Vital signs reviewed and stable  Post vital signs: Reviewed and stable  Last Vitals:  Vitals Value Taken Time  BP 111/52 05/04/2018  2:00 PM  Temp    Pulse 84 05/04/2018  2:02 PM  Resp 0 05/04/2018  2:02 PM  SpO2 98 % 05/04/2018  2:02 PM  Vitals shown include unvalidated device data.  Last Pain:  Vitals:   05/04/18 0517  TempSrc: Oral  PainSc:       Patients Stated Pain Goal: 0 (05/04/18 0400)  Complications: No apparent anesthesia complications

## 2018-05-04 NOTE — Anesthesia Procedure Notes (Signed)
Central Venous Catheter Insertion Performed by: Catalina Gravel, MD, anesthesiologist Start/End2/27/2020 6:55 AM, 05/04/2018 7:05 AM Patient location: Pre-op. Preanesthetic checklist: patient identified, IV checked, site marked, risks and benefits discussed, surgical consent, monitors and equipment checked, pre-op evaluation, timeout performed and anesthesia consent Position: Trendelenburg Lidocaine 1% used for infiltration and patient sedated Hand hygiene performed  and maximum sterile barriers used  Catheter size: 9 Fr MAC introducer Procedure performed using ultrasound guided technique. Ultrasound Notes:anatomy identified, needle tip was noted to be adjacent to the nerve/plexus identified, no ultrasound evidence of intravascular and/or intraneural injection and image(s) printed for medical record Attempts: 1 Following insertion, line sutured, dressing applied and Biopatch. Post procedure assessment: blood return through all ports, free fluid flow and no air  Patient tolerated the procedure well with no immediate complications.

## 2018-05-04 NOTE — Anesthesia Procedure Notes (Signed)
Procedure Name: Intubation Date/Time: 05/04/2018 8:25 AM Performed by: Janora Norlander, CRNA Pre-anesthesia Checklist: Patient identified, Emergency Drugs available, Suction available and Patient being monitored Oxygen Delivery Method: Circle system utilized Preoxygenation: Pre-oxygenation with 100% oxygen Induction Type: IV induction Ventilation: Two handed mask ventilation required Laryngoscope Size: Miller and 2 Grade View: Grade I Tube type: Oral Tube size: 8.0 mm Airway Equipment and Method: Stylet Placement Confirmation: ETT inserted through vocal cords under direct vision,  positive ETCO2,  CO2 detector and breath sounds checked- equal and bilateral Secured at: 21 (teeth) cm Tube secured with: Tape Dental Injury: Teeth and Oropharynx as per pre-operative assessment  Comments: Performed by Edmonia Lynch

## 2018-05-04 NOTE — Procedures (Signed)
Extubation Procedure Note  Patient Details:   Name: David Christian DOB: August 06, 1975 MRN: [id number]   Airway Documentation:    Vent end date: 05/04/18 Vent end time: 1817   Evaluation  O2 sats: stable throughout Complications: No apparent complications Patient did tolerate procedure well. Bilateral Breath Sounds: Clear, Diminished   Yes  Tylene Fantasia 05/04/2018, 7:20 PM

## 2018-05-04 NOTE — Anesthesia Preprocedure Evaluation (Signed)
Anesthesia Evaluation  Patient identified by MRN, date of birth, ID band Patient awake    Reviewed: Allergy & Precautions, NPO status , Patient's Chart, lab work & pertinent test results  Airway Mallampati: I  TM Distance: >3 FB Neck ROM: Full    Dental   Pulmonary sleep apnea , former smoker,    Pulmonary exam normal        Cardiovascular hypertension, Pt. on medications + CAD  Normal cardiovascular exam     Neuro/Psych    GI/Hepatic   Endo/Other    Renal/GU      Musculoskeletal   Abdominal   Peds  Hematology   Anesthesia Other Findings   Reproductive/Obstetrics                             Anesthesia Physical Anesthesia Plan  ASA: III  Anesthesia Plan: General   Post-op Pain Management:    Induction: Intravenous  PONV Risk Score and Plan: 2 and Treatment may vary due to age or medical condition  Airway Management Planned: Oral ETT  Additional Equipment: Arterial line, CVP, PA Cath, TEE and Ultrasound Guidance Line Placement  Intra-op Plan:   Post-operative Plan: Post-operative intubation/ventilation  Informed Consent: I have reviewed the patients History and Physical, chart, labs and discussed the procedure including the risks, benefits and alternatives for the proposed anesthesia with the patient or authorized representative who has indicated his/her understanding and acceptance.       Plan Discussed with: CRNA and Surgeon  Anesthesia Plan Comments:         Anesthesia Quick Evaluation

## 2018-05-04 NOTE — Anesthesia Procedure Notes (Addendum)
Arterial Line Insertion Start/End2/27/2020 7:10 AM Performed by: Janora Norlander, CRNA  Patient location: Pre-op. Preanesthetic checklist: patient identified, IV checked, site marked, risks and benefits discussed, surgical consent, monitors and equipment checked and pre-op evaluation Lidocaine 1% used for infiltration and patient sedated Right, radial was placed Catheter size: 20 G Hand hygiene performed , maximum sterile barriers used  and Seldinger technique used  Attempts: 1 Procedure performed without using ultrasound guided technique. Following insertion, dressing applied and Biopatch. Post procedure assessment: normal  Patient tolerated the procedure well with no immediate complications. Additional procedure comments: Performed by Edmonia Lynch.

## 2018-05-04 NOTE — Brief Op Note (Signed)
05/03/2018 - 05/04/2018  12:28 PM  PATIENT:  David Christian  43 y.o. male  PRE-OPERATIVE DIAGNOSIS:  left main and 3 vessel CAD, Class III angina  POST-OPERATIVE DIAGNOSIS:  left main and 3 vessel CAD, Class III angina  PROCEDURE:   CORONARY ARTERY BYPASS GRAFTING x5 using right greater saphenous vein harvested endoscopically and bilateral internal mammary arteries   LIMA->LAD RIMA->OM1 SVG->D1 SVG->Distal RCA->RPL  TRANSESOPHAGEAL ECHOCARDIOGRAM (TEE) (N/A)    SURGEON:  Surgeon(s) and Role:    [name], MD - Primary  PHYSICIAN ASSISTANT: Gaynelle Arabian, PA-C    ANESTHESIA:   general  EBL: Per anesthesia and perfusion records  BLOOD ADMINISTERED:none  DRAINS: Bilateral pleural tubes and mediastinal tube   LOCAL MEDICATIONS USED:  NONE  SPECIMEN:  No Specimen  DISPOSITION OF SPECIMEN:  N/A  COUNTS:  YES  DICTATION: .Dragon Dictation  PLAN OF CARE: Admit to inpatient   PATIENT DISPOSITION:  ICU - intubated and hemodynamically stable.   Delay start of Pharmacological VTE agent (>24hrs) due to surgical blood loss or risk of bleeding: yes

## 2018-05-04 NOTE — Anesthesia Procedure Notes (Addendum)
Central Venous Catheter Insertion Performed by: Cecile Hearing, MD, anesthesiologist Start/End2/27/2020 7:05 AM, 05/04/2018 7:15 AM Patient location: Pre-op. Preanesthetic checklist: patient identified, IV checked, site marked, risks and benefits discussed, surgical consent, monitors and equipment checked, pre-op evaluation, timeout performed and anesthesia consent Hand hygiene performed  and maximum sterile barriers used  Total catheter length 100. PA cath was placed.Swan type:thermodilution PA Cath depth:55 Procedure performed without using ultrasound guided technique. Attempts: 1 Patient tolerated the procedure well with no immediate complications.

## 2018-05-04 NOTE — Progress Notes (Signed)
ANTICOAGULATION CONSULT NOTE - Follow Up Consult  Pharmacy Consult for heparin Indication: CAD awaiting CABG  Labs: Recent Labs    05/03/18 1934 05/04/18 0158  HGB  --  12.4*  HCT  --  37.7*  PLT  --  239  LABPROT 14.6  --   INR 1.2  --   HEPARINUNFRC  --  0.18*  CREATININE  --  1.06    Assessment/Plan:  42yo male subtherapeutic on heparin with initial dosing post-cath though lab was drawn just 4hr after gtt started and likely still accumulating. Will continue gtt at current rate until off for OR.   Vernard Gambles, PharmD, BCPS  05/04/2018,3:18 AM

## 2018-05-04 NOTE — Progress Notes (Signed)
NIF -25 FVC 1.2L

## 2018-05-05 ENCOUNTER — Inpatient Hospital Stay (HOSPITAL_COMMUNITY): Payer: Managed Care, Other (non HMO)

## 2018-05-05 ENCOUNTER — Encounter (HOSPITAL_COMMUNITY): Payer: Self-pay | Admitting: Thoracic Surgery (Cardiothoracic Vascular Surgery)

## 2018-05-05 DIAGNOSIS — I209 Angina pectoris, unspecified: Secondary | ICD-10-CM

## 2018-05-05 LAB — GLUCOSE, CAPILLARY
GLUCOSE-CAPILLARY: 109 mg/dL — AB (ref 70–99)
Glucose-Capillary: 104 mg/dL — ABNORMAL HIGH (ref 70–99)
Glucose-Capillary: 99 mg/dL (ref 70–99)
Glucose-Capillary: 103 mg/dL — ABNORMAL HIGH (ref 70–99)
Glucose-Capillary: 96 mg/dL (ref 70–99)
Glucose-Capillary: 95 mg/dL (ref 70–99)
Glucose-Capillary: 94 mg/dL (ref 70–99)
Glucose-Capillary: 97 mg/dL (ref 70–99)
Glucose-Capillary: 120 mg/dL — ABNORMAL HIGH (ref 70–99)
Glucose-Capillary: 107 mg/dL — ABNORMAL HIGH (ref 70–99)

## 2018-05-05 LAB — CBC
HCT: 32.6 % — ABNORMAL LOW (ref 39.0–52.0)
HCT: 31.6 % — ABNORMAL LOW (ref 39.0–52.0)
Hemoglobin: 10.4 g/dL — ABNORMAL LOW (ref 13.0–17.0)
Hemoglobin: 10 g/dL — ABNORMAL LOW (ref 13.0–17.0)
MCH: 29.1 pg (ref 26.0–34.0)
MCH: 29.4 pg (ref 26.0–34.0)
MCHC: 31.9 g/dL (ref 30.0–36.0)
MCHC: 31.6 g/dL (ref 30.0–36.0)
MCV: 91.3 fL (ref 80.0–100.0)
MCV: 92.9 fL (ref 80.0–100.0)
Platelets: 145 10*3/uL — ABNORMAL LOW (ref 150–400)
Platelets: 169 10*3/uL (ref 150–400)
RBC: 3.57 MIL/uL — ABNORMAL LOW (ref 4.22–5.81)
RBC: 3.4 MIL/uL — ABNORMAL LOW (ref 4.22–5.81)
RDW: 14 % (ref 11.5–15.5)
RDW: 14 % (ref 11.5–15.5)
WBC: 10.8 10*3/uL — AB (ref 4.0–10.5)
WBC: 10.7 10*3/uL — ABNORMAL HIGH (ref 4.0–10.5)
nRBC: 0 % (ref 0.0–0.2)
nRBC: 0 % (ref 0.0–0.2)

## 2018-05-05 LAB — BASIC METABOLIC PANEL
ANION GAP: 8 (ref 5–15)
ANION GAP: 5 (ref 5–15)
BUN: 10 mg/dL (ref 6–20)
BUN: 11 mg/dL (ref 6–20)
CO2: 22 mmol/L (ref 22–32)
CO2: 22 mmol/L (ref 22–32)
Calcium: 8.1 mg/dL — ABNORMAL LOW (ref 8.9–10.3)
Calcium: 7.2 mg/dL — ABNORMAL LOW (ref 8.9–10.3)
Chloride: 104 mmol/L (ref 98–111)
Chloride: 108 mmol/L (ref 98–111)
Creatinine, Ser: 0.92 mg/dL (ref 0.61–1.24)
Creatinine, Ser: 0.94 mg/dL (ref 0.61–1.24)
GFR calc non Af Amer: >60 mL/min (ref >60)
Glucose, Bld: 106 mg/dL — ABNORMAL HIGH (ref 70–99)
Glucose, Bld: 168 mg/dL — ABNORMAL HIGH (ref 70–99)
Potassium: 4.1 mmol/L (ref 3.5–5.1)
Potassium: 3.5 mmol/L (ref 3.5–5.1)
Sodium: 134 mmol/L — ABNORMAL LOW (ref 135–145)
Sodium: 135 mmol/L (ref 135–145)

## 2018-05-05 LAB — MAGNESIUM
Magnesium: 2.3 mg/dL (ref 1.7–2.4)
Magnesium: 2.1 mg/dL (ref 1.7–2.4)

## 2018-05-05 MED ORDER — KETOROLAC TROMETHAMINE 30 MG/ML IJ SOLN
30.0000 mg | Freq: Four times a day (QID) | INTRAMUSCULAR | Status: DC
  Administered 2018-05-05 – 2018-05-06 (×5): 30 mg via INTRAVENOUS
  Filled 2018-05-05 (×5): qty 1

## 2018-05-05 MED ORDER — ALBUMIN HUMAN 5 % IV SOLN
12.5000 g | Freq: Once | INTRAVENOUS | Status: AC
  Administered 2018-05-05: 12.5 g via INTRAVENOUS
  Filled 2018-05-05: qty 250

## 2018-05-05 MED ORDER — INSULIN ASPART 100 UNIT/ML ~~LOC~~ SOLN: 0.0000 [IU] | SUBCUTANEOUS | Status: DC

## 2018-05-05 MED ORDER — POTASSIUM CHLORIDE 10 MEQ/50ML IV SOLN
10.0000 meq | INTRAVENOUS | Status: AC
  Administered 2018-05-05 – 2018-05-06 (×3): 10 meq via INTRAVENOUS
  Filled 2018-05-05 (×3): qty 50

## 2018-05-05 MED ORDER — ENOXAPARIN SODIUM 40 MG/0.4ML ~~LOC~~ SOLN
40.0000 mg | Freq: Every day | SUBCUTANEOUS | Status: DC
  Administered 2018-05-05 – 2018-05-08 (×4): 40 mg via SUBCUTANEOUS
  Filled 2018-05-05 (×4): qty 0.4

## 2018-05-05 MED ORDER — INSULIN ASPART 100 UNIT/ML ~~LOC~~ SOLN: 0.0000 [IU] | Freq: Three times a day (TID) | SUBCUTANEOUS | Status: DC

## 2018-05-05 NOTE — Discharge Instructions (Signed)

## 2018-05-05 NOTE — Progress Notes (Signed)
Patient ID: David Christian, male   DOB: 1975-09-27, 43 y.o.   MRN: [id number]  Progress Note    05/05/2018 1:52 PM 1 Day Post-Op  Subjective: Comfortable.  Sitting up in chair   Vitals:   05/05/18 1100 05/05/18 1200  BP: 110/75 102/66  Pulse: (!) 115 (!) 112  Resp: 15 18  Temp: 99 F (37.2 C)   SpO2: 97% 99%   Physical Exam: Remains neurologically intact  CBC    Component Value Date/Time   WBC 10.8 (H) 05/05/2018 1138   RBC 3.57 (L) 05/05/2018 1138   HGB 10.4 (L) 05/05/2018 1138   HGB 15.2 04/28/2018 1408   HCT 32.6 (L) 05/05/2018 1138   HCT 46.5 04/28/2018 1408   PLT 145 (L) 05/05/2018 1138   PLT 341 04/28/2018 1408   MCV 91.3 05/05/2018 1138   MCV 88 04/28/2018 1408   MCH 29.1 05/05/2018 1138   MCHC 31.9 05/05/2018 1138   RDW 14.0 05/05/2018 1138   RDW 13.7 04/28/2018 1408    BMET    Component Value Date/Time   NA 134 (L) 05/05/2018 1138   NA 137 04/28/2018 1408   K 4.1 05/05/2018 1138   CL 104 05/05/2018 1138   CO2 22 05/05/2018 1138   GLUCOSE 106 (H) 05/05/2018 1138   BUN 10 05/05/2018 1138   BUN 11 04/28/2018 1408   CREATININE 0.92 05/05/2018 1138   CALCIUM 8.1 (L) 05/05/2018 1138   GFRNONAA >60 05/05/2018 1138   GFRAA >60 05/05/2018 1138    INR    Component Value Date/Time   INR 1.5 (H) 05/04/2018 1414     Intake/Output Summary (Last 24 hours) at 05/05/2018 1352 Last data filed at 05/05/2018 0700 Gross per 24 hour  Intake 1879.08 ml  Output 3035 ml  Net -1155.92 ml     Assessment/Plan:  43 y.o. male doing well postop day 1 status post coronary artery bypass grafting.  Known high-grade asymptomatic right internal carotid artery stenosis.  Neurologically intact.  Will follow from sidelines.  Will eventually need elective right carotid endarterectomy following discharge     [name], MD [facility] [phone] 05/05/2018 1:52 PM

## 2018-05-05 NOTE — Discharge Summary (Signed)
Physician Discharge Summary  Patient ID: David Christian MRN: [id number] DOB/AGE: Jun 20, 1975 43 y.o.  Admit date: 05/03/2018 Discharge date: 05/09/2018  Admission Diagnoses:  Patient Active Problem List   Diagnosis Date Noted  . Left main coronary artery disease 05/03/2018  . Abnormal nuclear stress test: HIGH RISK - Anterior Ischemia. 04/29/2018  . Angina, class III (HCC) 04/29/2018  . Severe obesity (BMI 35.0-35.9 with comorbidity) (HCC) 04/28/2018  . Coronary artery disease of native artery of native heart with stable angina pectoris (HCC) 04/28/2018  . Mixed hyperlipidemia 03/05/2015  . Essential hypertension 08/21/2014  . Obesity (BMI 35.0-39.9 without comorbidity) 05/23/2013  . External hemorrhoids 05/23/2013  . OBSTRUCTIVE SLEEP APNEA 02/08/2007   Discharge Diagnoses:   Patient Active Problem List   Diagnosis Date Noted  . S/P CABG x 5 05/04/2018  . Left main coronary artery disease 05/03/2018  . Abnormal nuclear stress test: HIGH RISK - Anterior Ischemia. 04/29/2018  . Angina, class III (HCC) 04/29/2018  . Severe obesity (BMI 35.0-35.9 with comorbidity) (HCC) 04/28/2018  . Coronary artery disease of native artery of native heart with stable angina pectoris (HCC) 04/28/2018  . Mixed hyperlipidemia 03/05/2015  . Essential hypertension 08/21/2014  . Obesity (BMI 35.0-39.9 without comorbidity) 05/23/2013  . External hemorrhoids 05/23/2013  . OBSTRUCTIVE SLEEP APNEA 02/08/2007   Discharged Condition: good  History of Present Illness:  David Christian is a pleasant 43 year old male who is a history of hypertension, dyslipidemia, obstructive sleep apnea, obesity, remote tobacco smoker until about 18 years ago who is been having exertional chest pressure advancing to chest pain associated with shortness of breath for about 3 years.  He sought medical attention regarding the symptoms on several occasions and received treatment aimed at managing suspected asthma and gastroesophageal  reflux disease.  After his symptoms persisted he sought medical attention from a cardiologist in recent weeks and underwent a Lexiscan Myoview on 04/28/2018 that demonstrated a large area of reversible ischemia in the distribution of the LAD.  Left heart catheterization was recommended and he presented today for the procedure on an elective basis.  Left heart catheterization showed an ostial to distal left main stenosis of 75%,  a proximal to mid LAD stenosis of 99%,  and an ostial right coronary artery stenosis of 50% with multiple distal lesions occluding an 80% stenosis of the right posterior lateral branch of the RCA.  His ejection fraction was estimated at 45 to 50%.  It was felt coronary bypass grafting would be indicated, the patient was admitted and Cardiothoracic consult was obtained.  Hospital Course:   He remained chest pain free during hospitalization.  He was evaluated by Dr. Donata Clay who was in agreement the patient would require coronary bypass grafting.  The risks and benefits of the procedure were explained to the patient and he was agreeable to proceed.  He was taken to the operating room by Dr. Dorris Fetch on 05/03/2018.  He underwent CABG x 5 utilizing LIMA to LAD, RIMA to to OM1, SVG to diagonal 1, and sequential SVG to Distal RCA and PLVB.  He also underwent endoscopic harvest of greater saphenous vein from his right leg.  He toleated the procedure without difficulty and was taken to the SICU in stable condition.  He was extubated the evening of surgery.  During his stay in the SICU the patient was weaned off Neo-synephrine and Dopamine as tolerated. His chest tubes and arterial lines were removed without difficulty.  He was maintaining NSR and was transferred to the  telemetry unit in stable condition.  He remains in NSR.  His pacing wires were removed without difficulty.  He was diuresed for volume over load state.  He is ambulating without difficulty.  His incision is healing without  evidence of infection.  He is medically stable for discharge home today.  Significant Diagnostic Studies: angiography:    Ost LM to Dist LM lesion is 75% stenosed.  Prox LAD to Mid LAD lesion is 99% stenosed. Chronic subtotal occlusion with bridging, left left and right left collaterals  Ost RCA lesion is 50% stenosed. Prox RCA lesion is 50% stenosed. Mid RCA lesion is 55% stenosed. Post Atrio lesion is 80% stenosed.  The left ventricular ejection fraction is 45-50% by visual estimate. Cannot exclude regional wall motion normality.  LV end diastolic pressure is normal.   SUMMARY  Severe multivessel CAD: 75% Left Main, subtotal 99-100% CTO of LAD after 1stDiag, diffuse moderate 50 to 60% disease in the ostial, proximal and mid RCA with 80% RPL 1  Borderline low LVEF of 50% on Myoview with no obvious wall motion normality (however not adequately imaged with LV gram).  Borderline elevated LVEDP.  Treatments: surgery:   Coronary artery bypass grafting x5 (left internal mammary to the left internal mammary artery to left anterior descending, right internal mammary artery to obtuse marginal 1, saphenous vein graft to first diagonal, sequential saphenous vein  graft to distal right coronary and posterolateral).  Discharge Exam: Blood pressure 109/61, pulse 95, temperature 98.4 F (36.9 C), temperature source Oral, resp. rate 16, height  (1.803 m), weight 124.6 kg, SpO2 93 %.  General appearance: alert, cooperative and no distress Heart: regular rate and rhythm Lungs: clear to auscultation bilaterally Abdomen: benign Extremities: min edema Wound: incis healing well  Disposition: Discharge disposition: 01-Home or Self Care       Discharge Instructions    Discharge patient   Complete by:  As directed    Discharge disposition:  01-Home or Self Care   Discharge patient date:  05/09/2018     Allergies as of 05/09/2018   No Known Allergies     Medication List    STOP  taking these medications   ezetimibe 10 MG tablet Commonly known as:  ZETIA   lisinopril-hydrochlorothiazide 10-12.5 MG tablet Commonly known as:  PRINZIDE,ZESTORETIC   metoprolol succinate 25 MG 24 hr tablet Commonly known as:  TOPROL XL     TAKE these medications   acetaminophen 325 MG tablet Commonly known as:  TYLENOL Take 2 tablets (650 mg total) by mouth every 6 (six) hours as needed for mild pain.   aspirin 325 MG EC tablet Take 1 tablet (325 mg total) by mouth daily. Start taking on:  May 10, 2018 What changed:    medication strength  how much to take   atorvastatin 80 MG tablet Commonly known as:  LIPITOR Take 1 tablet (80 mg total) by mouth every evening. What changed:    medication strength  how much to take   metoprolol tartrate 25 MG tablet Commonly known as:  LOPRESSOR Take 1 tablet (25 mg total) by mouth 2 (two) times daily.   oxyCODONE 5 MG immediate release tablet Commonly known as:  Oxy IR/ROXICODONE Take 1-2 tablets (5-10 mg total) by mouth every 6 (six) hours as needed for up to 7 days for severe pain.      Follow-up Information    [name], MD Follow up on 06/06/2018.   Specialty:  Cardiothoracic Surgery Why:  Appointment is at 10:45, please get CXR at 10:15 at [facility] located on first floor of our office building Contact information: [address] [place] [address] [phone]        Azalee Course, [place] Follow up on 05/19/2018.   Specialties:  Cardiology, Radiology Why:  Appointment is at 11:30 Contact information: [address] [place] [address] [phone]          The patient has been discharged on:   1.Beta Blocker:  Yes [ y  ]                              No   [   ]                              If No, reason:  2.Ace Inhibitor/ARB: Yes [   ]                                     No  [ n   ]                                     If No, reason:well controlled BP,  some dizziness  3.Statin:   Yes Cove.Etienne   ]                  No  [   ]                  If No, reason:  4.Ecasa:  Yes  Cove.Etienne   ]                  No   [   ]                  If No, reason:  Signed: Melonie Germani E Clovis Mankins 05/09/2018, 9:01 AM

## 2018-05-05 NOTE — Progress Notes (Signed)
1 Day Post-Op Procedure(s) (LRB): CORONARY ARTERY BYPASS GRAFTING (CABG) x5 using right greater saphenous vein harvested endoscopically and bilateral left internal mammary arteries (N/A) TRANSESOPHAGEAL ECHOCARDIOGRAM (TEE) (N/A) Subjective: C/o incisional pain  Objective: Vital signs in last 24 hours: Temp:  [97.2 F (36.2 C)-99.9 F (37.7 C)] 99.5 F (37.5 C) (02/28 0700) Pulse Rate:  [84-102] 102 (02/28 0700) Cardiac Rhythm: Normal sinus rhythm (02/28 0400) Resp:  [10-27] 22 (02/28 0700) BP: (87-115)/(46-76) 113/74 (02/28 0700) SpO2:  [96 %-100 %] 98 % (02/28 0700) Arterial Line BP: (61-138)/(50-75) 61/50 (02/27 1930) FiO2 (%):  [40 %-50 %] 40 % (02/27 1740) Weight:  [103.3 kg] 103.3 kg (02/28 0500)  Hemodynamic parameters for last 24 hours: PAP: (24-46)/(15-31) 33/21 CO:  [4.7 L/min-6.3 L/min] 5.2 L/min CI:  [2 L/min/m2-2.7 L/min/m2] 2.2 L/min/m2  Intake/Output from previous day: 02/27 0701 - 02/28 0700 In: 4994.1 [I.V.:3692.3; Blood:465; IV Piggyback:836.7] Out: 4259 [Urine:3765; Blood:800; Chest Tube:520] Intake/Output this shift: No intake/output data recorded.  General appearance: alert, cooperative and no distress Neurologic: intact Heart: regular rate and rhythm Lungs: diminished breath sounds bibasilar Abdomen: soft, nontender, hypoactive BS  Lab Results: Recent Labs    05/04/18 1414 05/04/18 1809 05/04/18 2026  WBC 20.3*  --  15.7*  HGB 11.7* 11.6* 11.4*  10.9*  HCT 35.2* 34.0* 34.9*  32.0*  PLT 191  --  211   BMET:  Recent Labs    05/04/18 0158  05/04/18 1300  05/04/18 1809 05/04/18 2026  NA 135   < > 136   < > 137 135  136  K 3.9   < > 4.8   < > 4.6 4.6  4.4  CL 103  --   --   --   --  109  CO2 23  --   --   --   --  19*  GLUCOSE 97   < > 147*  --   --  139*  BUN 13  --   --   --   --  9  CREATININE 1.06  --   --   --   --  0.88  CALCIUM 8.4*  --   --   --   --  7.9*   < > = values in this interval not displayed.    PT/INR:  Recent  Labs    05/04/18 1414  LABPROT 17.6*  INR 1.5*   ABG    Component Value Date/Time   PHART 7.360 05/04/2018 2026   HCO3 21.4 05/04/2018 2026   TCO2 22 05/04/2018 2026   ACIDBASEDEF 4.0 (H) 05/04/2018 2026   O2SAT 96.0 05/04/2018 2026   CBG (last 3)  Recent Labs    05/05/18 0230 05/05/18 0329 05/05/18 0429  GLUCAP 103* 96 95    Assessment/Plan: S/P Procedure(s) (LRB): CORONARY ARTERY BYPASS GRAFTING (CABG) x5 using right greater saphenous vein harvested endoscopically and bilateral left internal mammary arteries (N/A) TRANSESOPHAGEAL ECHOCARDIOGRAM (TEE) (N/A) -Doing well POD # 1 CV- hemodynamics stable- off dopamine, weaning neo   Dc swan  ASA, statin, beta blocker RESP- IS for basilar atelectasis RENAL- creatinine and lytes OK ENDO- CBG normal- CBG AC and HS Anemia secondary to ABL- mild, follow Dc chest tubes SCD + enoxaparin for DVT prophylaxis Pain control- requiring relatively high doses of morphine and oxycodone  Will add toradol for 48 hours Cardiac rehab    LOS: 2 days    [name] 05/05/2018

## 2018-05-05 NOTE — Progress Notes (Signed)
Placed patient on home CPAP with oxygen set at 4lpm

## 2018-05-05 NOTE — Op Note (Signed)
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  PROCEDURE:  Coronary artery bypass grafting x 5  Left internal mammary to left anterior descending,  Right internal mammary artery to obtuse marginal 1,  Saphenous vein graft to first diagonal,  Sequential saphenous vein  graft to distal right coronary and    posterolateral  Endoscopic vein harvest right thigh  SURGEON:  Charlett Lango, MD  ASSISTANT:  Jillyn Hidden, PA-C  ANESTHESIA:  General.  FINDINGS:  Transesophageal echocardiography showed preserved left ventricular function with no significant valvular pathology.  Diagonal fair quality. Remaining targets good quality. Good quality conduits.  CLINICAL NOTE:  Mr. Rinehimer is a 43 year old gentleman with multiple cardiac risk factors who presented with class III anginal symptoms.  At catheterization, he was found to have left main and 3-vessel coronary disease.  He was advised to undergo  coronary artery bypass grafting.  The indications, risks, benefits, and alternatives were discussed in detail with the patient.  He understood and accepted the risks and agreed to proceed.  DESCRIPTION OF PROCEDURE:  He was brought to the preoperative holding area on 05/04/2018.  Anesthesia placed a Swan-Ganz catheter and an arterial blood pressure monitoring line.  He was taken to the Operating Room, anesthetized and intubated.  A Foley  catheter was placed.  Intravenous antibiotics were administered.  Transesophageal echocardiography was performed by Dr. Arta Bruce.  It revealed preserved left ventricular function with no significant valvular  pathology.  The chest, abdomen and legs  were prepped and draped in the usual sterile fashion.  A median sternotomy was performed.  The left internal mammary artery was harvested using standard technique.  Simultaneously, an incision was made just below the knee in the medial aspect of the right leg and the greater saphenous vein was harvested from  below the knee to the groin endoscopically.  Two thousand units of heparin was administered during the vessel harvest.  After harvesting the left mammary artery, the right mammary artery was harvested using standard technique.  Both the mammary arteries were 2 mm good  quality vessels with excellent flow.  The vein was relatively large in caliber, but otherwise good quality.  There were no varicosities.  The remainder of a full heparin dose was given.  A sternal retractor was placed.  The pericardium was opened.  The ascending aorta was inspected.  It was relatively small for the patient's size with no evidence of atherosclerotic disease.  After  confirming adequate anticoagulation with ACT measurement, the aorta was cannulated via concentric 2-0 Ethibond pledgeted pursestring sutures.  A dual stage venous cannula was placed via a pursestring suture in the right atrial appendage.  Cardiopulmonary  bypass was initiated.  Flows were maintained per protocol.  The patient was cooled to 32 degrees Celsius.  The coronary arteries were inspected and anastomotic sites were chosen.  The conduits were inspected and cut to length.  A foam pad was placed in  the pericardium to insulate the heart.  A temperature probe was placed in the myocardial septum and a cardioplegia cannula was placed in the ascending aorta.  The aorta was cross clamped.  The left ventricle was emptied via the aortic root vent.  Cardiac arrest achieved  with a combination of cold antegrade blood cardioplegia and topical iced saline.  One liter of cardioplegia was administered.  There was a  rapid  diastolic arrest.  There was septal cooling to 10 degrees Celsius.  A reversed saphenous vein graft was placed sequentially to the distal right coronary and the posterolateral branch.  The right coronary had multiple 50% stenoses.  The graft was placed at approximately the level of the takeoff of the posterior descending,  a 1.5 mm probe did pass into the posterior descending.  The right coronary was a 2.5 mm good quality target the site of anastomosis.  The side-to-side anastomosis was performed with a running 7-0 Prolene suture.  There was excellent flow through this  anastomosis at its completion.  The distal end of the vein then was bevelled and was anastomosed end-to-side to the posterolateral branch.  This was a very distal branch.  It was a 1.5 mm good quality target.  It had a 95% stenosis proximally.  The  anastomosis again was performed with a running 7-0 Prolene suture.  A probe passed easily distally at the completion of the anastomosis, cardioplegia was administered through the graft.  There was good flow and good hemostasis.  A reversed saphenous vein graft was placed end-to-side to the first diagonal branch of the LAD.  This was a 1.5 mm fair quality target. It was relatively thin walled.  The vein was anastomosed end-to-side with a running 7-0 Prolene suture.  The 1.5 mm probe did pass  easily distally at the completion of the anastomosis.  Cardioplegia was administered down this graft and cardioplegia was administered down the aortic root as well.    Next, an incision was made in the right side of the pericardium.  The right mammary  artery was passed through the transverse sinus and then anastomosed to the first obtuse marginal.  This was a relatively high anterolateral branch.  It was a 1.5 mm good quality target at the site of anastomosis.  It did have plaque just proximal to the  anastomosis.  The mammary was a good quality vessel.  The end-to-side anastomosis was performed with a  running 8-0 Prolene suture.  At completion of the anastomosis, the bulldog clamp was briefly removed to inspect for hemostasis and then was replaced.  Additional cardioplegia was administered.  The left internal mammary artery then was brought through a window in the pericardium.  The distal end was bevelled.  It was anastomosed end-to-side to the distal LAD.  The distal LAD was a 1.5 mm good quality  target.  The LAD was far better quality than it appeared on catheterization.  A probe did pass proximally to the level of approximately the second diagonal and then distally it passed to the apex.  The mammary was good quality.  The end-to-side anastomosis  was performed with a running 8-0 Prolene suture.  After completion of this anastomosis, the bulldog clamps were removed to inspect for hemostasis.  Septal rewarming was noted.  The bulldog clamps were placed and the mammary pedicle was tacked to the  epicardial surface of the heart with 6-0 Prolene sutures.  Additional cardioplegia was administered.  The vein grafts were cut to length.  The cardioplegia cannula was removed from the ascending aorta.  The proximal vein graft anastomoses were performed to 4.5 mm punch aortotomies with running 6-0 Prolene  sutures.  After completion of the second vein graft anastomosis, the patient was placed in Trendelenburg position.  Lidocaine was administered.  The aortic root was deaired.  The aortic crossclamp was removed.  The total crossclamp time was 79 minutes.   The patient required a single defibrillation with 10 joules and then was in sinus rhythm thereafter.  While rewarming was completed, all proximal and distal anastomoses were inspected for hemostasis.  Epicardial pacing wires were placed on the right  ventricle and right atrium.  When the patient had rewarmed to a core temperature of 37 degrees Celsius, he was weaned from cardiopulmonary bypass on the first attempt.  He was in sinus rhythm and on no inotropic  support.  The total bypass time was 112  minutes.  The initial cardiac index was greater than 2 liters per minute per meter squared.  The patient remained hemodynamically stable throughout the post-bypass period.  A test dose of protamine was administered and was well tolerated.  Transesophageal echocardiography showed no change from the pre-bypass study.  The atrial cannula was removed.  The remainder of the protamine was administered without incident.  The  aortic cannula was removed.  There was good hemostasis at both sites.  The chest was copiously irrigated with warm saline.  Hemostasis was achieved.  The pericardium was reapproximated over the aorta and then over the base of the heart taking care not to  kink or put tension on either mammary graft.  The Swan-Ganz catheter was repositioned due to an abnormal waveform.  Bilateral pleural tubes and a single mediastinal tube were placed through separate subcostal incisions and secured with #1 silk sutures.   The sternum was closed with a combination of single and double heavy gauge stainless steel wires.  The pectoralis fascia, subcutaneous tissue and skin were closed in standard fashion.  After closure of the chest, the cardiac index dropped.  All other  hemodynamic parameters remained the same and there were no ST changes.  Relook with an echocardiogram showed no change in left ventricular function.  The patient was started on low dose dopamine infusion for transport to the Surgical Intensive Care Unit.   All sponge, needle and instrument counts were correct at the end of the procedure.  The patient was taken to the Surgical Intensive Care Unit in good condition.  TN/NUANCE  D:05/04/2018 T:05/05/2018 JOB:[id number]

## 2018-05-05 NOTE — Progress Notes (Signed)
Patient ID: David Christian, male   DOB: 22-Mar-1975, 43 y.o.   MRN: [id number] EVENING ROUNDS NOTE :     [address]       [name] [address]             [phone]                 1 Day Post-Op Procedure(s) (LRB): CORONARY ARTERY BYPASS GRAFTING (CABG) x5 using right greater saphenous vein harvested endoscopically and bilateral left internal mammary arteries (N/A) TRANSESOPHAGEAL ECHOCARDIOGRAM (TEE) (N/A)  Total Length of Stay:  LOS: 2 days  BP 120/73   Pulse (!) 111   Temp 97.9 F (36.6 C)   Resp 13   Ht 5\' 11"  (1.803 m)   Wt 103.3 kg   SpO2 100%   BMI 31.77 kg/m   .Intake/Output      02/27 0701 - 02/28 0700 02/28 0701 - 02/29 0700   P.O.  360   I.V. (mL/kg) 3692.3 (35.7) 233.7 (2.3)   Blood 465    IV Piggyback 836.7    Total Intake(mL/kg) 4994.1 (48.3) 593.7 (5.7)   Urine (mL/kg/hr) 3765 (1.5) 400 (0.4)   Blood 800    Chest Tube 520    Total Output 5085 400   Net -90.9 +193.7          . sodium chloride    . sodium chloride    . sodium chloride 20 mL/hr at 05/04/18 1355  . cefUROXime (ZINACEF)  IV 1.5 g (05/05/18 1722)  . dexmedetomidine (PRECEDEX) IV infusion Stopped (05/04/18 1659)  . lactated ringers    . lactated ringers    . lactated ringers 10 mL/hr at 05/05/18 1700  . nitroGLYCERIN Stopped (05/04/18 1356)  . phenylephrine (NEO-SYNEPHRINE) Adult infusion 10 mcg/min (05/05/18 1700)     Lab Results  Component Value Date   WBC 10.7 (H) 05/05/2018   HGB 10.0 (L) 05/05/2018   HCT 31.6 (L) 05/05/2018   PLT 169 05/05/2018   GLUCOSE 106 (H) 05/05/2018   CHOL 270 (H) 03/04/2015   TRIG 217.0 (H) 03/04/2015   HDL 40.80 03/04/2015   LDLDIRECT 182.0 03/04/2015   LDLCALC 205 (H) 05/23/2013   ALT 18 04/07/2015   AST 20 04/07/2015   NA 134 (L) 05/05/2018   K 4.1 05/05/2018   CL 104 05/05/2018   CREATININE 0.92 05/05/2018   BUN 10 05/05/2018   CO2 22 05/05/2018   TSH 1.14 03/04/2015   INR 1.5 (H) 05/04/2018   HGBA1C 5.3 03/04/2015     Stable day after cabg yesterday   Delight Ovens MD  Beeper [phone] Office [phone] 05/05/2018 5:46 PM

## 2018-05-06 ENCOUNTER — Inpatient Hospital Stay (HOSPITAL_COMMUNITY): Payer: Managed Care, Other (non HMO)

## 2018-05-06 LAB — GLUCOSE, CAPILLARY
GLUCOSE-CAPILLARY: 118 mg/dL — AB (ref 70–99)
Glucose-Capillary: 117 mg/dL — ABNORMAL HIGH (ref 70–99)
Glucose-Capillary: 121 mg/dL — ABNORMAL HIGH (ref 70–99)
Glucose-Capillary: 125 mg/dL — ABNORMAL HIGH (ref 70–99)
Glucose-Capillary: 148 mg/dL — ABNORMAL HIGH (ref 70–99)
Glucose-Capillary: 152 mg/dL — ABNORMAL HIGH (ref 70–99)
Glucose-Capillary: 165 mg/dL — ABNORMAL HIGH (ref 70–99)

## 2018-05-06 LAB — HEMOGLOBIN A1C
Hgb A1c MFr Bld: 5.3 % (ref 4.8–5.6)
Mean Plasma Glucose: 105.41 mg/dL

## 2018-05-06 LAB — CBC
HEMATOCRIT: 30.4 % — AB (ref 39.0–52.0)
HEMOGLOBIN: 9.6 g/dL — AB (ref 13.0–17.0)
MCH: 29.4 pg (ref 26.0–34.0)
MCHC: 31.6 g/dL (ref 30.0–36.0)
MCV: 93 fL (ref 80.0–100.0)
Platelets: 161 10*3/uL (ref 150–400)
RBC: 3.27 MIL/uL — ABNORMAL LOW (ref 4.22–5.81)
RDW: 13.9 % (ref 11.5–15.5)
WBC: 9.8 10*3/uL (ref 4.0–10.5)
nRBC: 0 % (ref 0.0–0.2)

## 2018-05-06 LAB — BASIC METABOLIC PANEL
Anion gap: 5 (ref 5–15)
BUN: 10 mg/dL (ref 6–20)
CHLORIDE: 103 mmol/L (ref 98–111)
CO2: 25 mmol/L (ref 22–32)
Calcium: 8.1 mg/dL — ABNORMAL LOW (ref 8.9–10.3)
Creatinine, Ser: 0.9 mg/dL (ref 0.61–1.24)
GFR calc Af Amer: >60 mL/min (ref >60)
GFR calc non Af Amer: >60 mL/min (ref >60)
Glucose, Bld: 130 mg/dL — ABNORMAL HIGH (ref 70–99)
POTASSIUM: 4.5 mmol/L (ref 3.5–5.1)
SODIUM: 133 mmol/L — AB (ref 135–145)

## 2018-05-06 MED ORDER — TRAMADOL HCL 50 MG PO TABS
50.0000 mg | ORAL_TABLET | ORAL | Status: DC | PRN
  Administered 2018-05-08: 100 mg via ORAL
  Filled 2018-05-06: qty 2

## 2018-05-06 MED ORDER — SODIUM CHLORIDE 0.9% FLUSH
3.0000 mL | Freq: Two times a day (BID) | INTRAVENOUS | Status: DC
  Administered 2018-05-06 – 2018-05-08 (×6): 3 mL via INTRAVENOUS

## 2018-05-06 MED ORDER — SODIUM CHLORIDE 0.9% FLUSH: 3.0000 mL | INTRAVENOUS | Status: DC | PRN

## 2018-05-06 MED ORDER — SODIUM CHLORIDE 0.9 % IV SOLN: 250.0000 mL | INTRAVENOUS | Status: DC | PRN

## 2018-05-06 MED ORDER — PANTOPRAZOLE SODIUM 40 MG PO TBEC
40.0000 mg | DELAYED_RELEASE_TABLET | Freq: Every day | ORAL | Status: DC
  Administered 2018-05-07 – 2018-05-09 (×3): 40 mg via ORAL
  Filled 2018-05-06 (×4): qty 1

## 2018-05-06 MED ORDER — ASPIRIN EC 325 MG PO TBEC
325.0000 mg | DELAYED_RELEASE_TABLET | Freq: Every day | ORAL | Status: DC
  Administered 2018-05-06 – 2018-05-09 (×4): 325 mg via ORAL
  Filled 2018-05-06 (×3): qty 1

## 2018-05-06 MED ORDER — INSULIN ASPART 100 UNIT/ML ~~LOC~~ SOLN
0.0000 [IU] | Freq: Three times a day (TID) | SUBCUTANEOUS | Status: DC
  Administered 2018-05-06 (×2): 2 [IU] via SUBCUTANEOUS
  Administered 2018-05-06: 4 [IU] via SUBCUTANEOUS

## 2018-05-06 MED ORDER — GUAIFENESIN ER 600 MG PO TB12: 600.0000 mg | ORAL_TABLET | Freq: Two times a day (BID) | ORAL | Status: DC | PRN

## 2018-05-06 MED ORDER — BISACODYL 5 MG PO TBEC
10.0000 mg | DELAYED_RELEASE_TABLET | Freq: Every day | ORAL | Status: DC | PRN
  Administered 2018-05-06 – 2018-05-08 (×2): 10 mg via ORAL
  Filled 2018-05-06: qty 2

## 2018-05-06 MED ORDER — OXYCODONE HCL 5 MG PO TABS
5.0000 mg | ORAL_TABLET | ORAL | Status: DC | PRN
  Administered 2018-05-06: 5 mg via ORAL
  Administered 2018-05-06 – 2018-05-09 (×16): 10 mg via ORAL
  Filled 2018-05-06 (×17): qty 2

## 2018-05-06 MED ORDER — MOVING RIGHT ALONG BOOK
Freq: Once | Status: DC
  Filled 2018-05-06: qty 1

## 2018-05-06 MED ORDER — ONDANSETRON HCL 4 MG/2ML IJ SOLN: 4.0000 mg | Freq: Four times a day (QID) | INTRAMUSCULAR | Status: DC | PRN

## 2018-05-06 MED ORDER — DOCUSATE SODIUM 100 MG PO CAPS
200.0000 mg | ORAL_CAPSULE | Freq: Every day | ORAL | Status: DC
  Administered 2018-05-06 – 2018-05-09 (×4): 200 mg via ORAL
  Filled 2018-05-06 (×3): qty 2

## 2018-05-06 MED ORDER — METOPROLOL TARTRATE 12.5 MG HALF TABLET
12.5000 mg | ORAL_TABLET | Freq: Two times a day (BID) | ORAL | Status: DC
  Administered 2018-05-06 – 2018-05-08 (×5): 12.5 mg via ORAL
  Filled 2018-05-06 (×4): qty 1

## 2018-05-06 MED ORDER — BISACODYL 10 MG RE SUPP: 10.0000 mg | Freq: Every day | RECTAL | Status: DC | PRN

## 2018-05-06 MED ORDER — ACETAMINOPHEN 325 MG PO TABS: 650.0000 mg | ORAL_TABLET | Freq: Four times a day (QID) | ORAL | Status: DC | PRN

## 2018-05-06 MED ORDER — ONDANSETRON HCL 4 MG PO TABS: 4.0000 mg | ORAL_TABLET | Freq: Four times a day (QID) | ORAL | Status: DC | PRN

## 2018-05-06 MED ORDER — FUROSEMIDE 20 MG PO TABS
20.0000 mg | ORAL_TABLET | Freq: Every day | ORAL | Status: DC
  Administered 2018-05-06 – 2018-05-07 (×2): 20 mg via ORAL
  Filled 2018-05-06 (×2): qty 1

## 2018-05-06 NOTE — Progress Notes (Signed)
Patient ID: David Christian, male   DOB: 1975-06-22, 43 y.o.   MRN: [id number] EVENING ROUNDS NOTE :     [address]       [place] [address]             [phone]                 2 Days Post-Op Procedure(s) (LRB): CORONARY ARTERY BYPASS GRAFTING (CABG) x5 using right greater saphenous vein harvested endoscopically and bilateral left internal mammary arteries (N/A) TRANSESOPHAGEAL ECHOCARDIOGRAM (TEE) (N/A)  Total Length of Stay:  LOS: 3 days  BP (!) 100/56   Pulse (!) 101   Temp 99.2 F (37.3 C) (Oral)   Resp 20   Ht 5\' 11"  (1.803 m)   Wt 102.5 kg   SpO2 96%   BMI 31.52 kg/m   .Intake/Output      02/29 0701 - 03/01 0700   P.O.    I.V. (mL/kg)    IV Piggyback    Total Intake(mL/kg)    Urine (mL/kg/hr) 300 (0.2)   Total Output 300   Net -300         . sodium chloride       Lab Results  Component Value Date   WBC 9.8 05/06/2018   HGB 9.6 (L) 05/06/2018   HCT 30.4 (L) 05/06/2018   PLT 161 05/06/2018   GLUCOSE 130 (H) 05/06/2018   CHOL 270 (H) 03/04/2015   TRIG 217.0 (H) 03/04/2015   HDL 40.80 03/04/2015   LDLDIRECT 182.0 03/04/2015   LDLCALC 205 (H) 05/23/2013   ALT 18 04/07/2015   AST 20 04/07/2015   NA 133 (L) 05/06/2018   K 4.5 05/06/2018   CL 103 05/06/2018   CREATININE 0.90 05/06/2018   BUN 10 05/06/2018   CO2 25 05/06/2018   TSH 1.14 03/04/2015   INR 1.5 (H) 05/04/2018   HGBA1C 5.3 05/06/2018   Still hr up sinus on  lopressor    Delight Ovens MD  Beeper [phone] Office [phone] 05/06/2018 7:40 PM

## 2018-05-06 NOTE — Progress Notes (Deleted)
Second bottle of contrast given via OGT.

## 2018-05-06 NOTE — Progress Notes (Signed)
Patient ID: RAYNALDO WO, male   DOB: 1976-02-02, 43 y.o.   MRN: [id number] [facility] DAILY ICU PROGRESS NOTE                   [address]            [name] [address]          [phone]   2 Days Post-Op Procedure(s) (LRB): CORONARY ARTERY BYPASS GRAFTING (CABG) x5 using right greater saphenous vein harvested endoscopically and bilateral left internal mammary arteries (N/A) TRANSESOPHAGEAL ECHOCARDIOGRAM (TEE) (N/A)  Total Length of Stay:  LOS: 3 days   Subjective: Awake and alert , walking around unit  Objective: Vital signs in last 24 hours: Temp:  [97.7 F (36.5 C)-99.3 F (37.4 C)] 99.1 F (37.3 C) (02/29 0739) Pulse Rate:  [88-123] 123 (02/29 0900) Cardiac Rhythm: Normal sinus rhythm (02/29 0800) Resp:  [9-22] 22 (02/29 0900) BP: (89-120)/(51-75) 107/55 (02/29 0600) SpO2:  [84 %-100 %] 100 % (02/29 0900) Weight:  [102.5 kg] 102.5 kg (02/29 0500)  Filed Weights   05/03/18 1334 05/05/18 0500 05/06/18 0500  Weight: 115 kg 103.3 kg 102.5 kg    Weight change: -0.816 kg   Hemodynamic parameters for last 24 hours: PAP: (29-32)/(18-20) 29/18  Intake/Output from previous day: 02/28 0701 - 02/29 0700 In: 1005.2 [P.O.:360; I.V.:309.7; IV Piggyback:335.5] Out: 1600 [Urine:1600]  Intake/Output this shift: No intake/output data recorded.  Current Meds: Scheduled Meds: . acetaminophen  1,000 mg Oral Q6H   Or  . acetaminophen (TYLENOL) oral liquid 160 mg/5 mL  1,000 mg Per Tube Q6H  . aspirin EC  325 mg Oral Daily   Or  . aspirin  324 mg Per Tube Daily  . atorvastatin  80 mg Oral QPM  . bisacodyl  10 mg Oral Daily   Or  . bisacodyl  10 mg Rectal Daily  . Chlorhexidine Gluconate Cloth  6 each Topical Daily  . docusate sodium  200 mg Oral Daily  . enoxaparin (LOVENOX) injection  40 mg Subcutaneous QHS  . insulin aspart  0-15 Units Subcutaneous TID WC  . ketorolac  30 mg Intravenous Q6H  . mouth rinse  15 mL Mouth Rinse BID  . metoprolol tartrate   12.5 mg Oral BID   Or  . metoprolol tartrate  12.5 mg Per Tube BID  . pantoprazole  40 mg Oral Daily  . sodium chloride flush  10-40 mL Intracatheter Q12H  . sodium chloride flush  3 mL Intravenous Q12H   Continuous Infusions: . sodium chloride    . sodium chloride    . sodium chloride 20 mL/hr at 05/04/18 1355  . dexmedetomidine (PRECEDEX) IV infusion Stopped (05/04/18 1659)  . lactated ringers    . lactated ringers    . lactated ringers Stopped (05/05/18 2706)  . nitroGLYCERIN Stopped (05/04/18 1356)  . phenylephrine (NEO-SYNEPHRINE) Adult infusion Stopped (05/05/18 2358)   PRN Meds:.sodium chloride, lactated ringers, metoprolol tartrate, midazolam, morphine injection, ondansetron (ZOFRAN) IV, oxyCODONE, sodium chloride flush, sodium chloride flush, traMADol  General appearance: alert, cooperative and no distress Neurologic: intact Heart: regular rate and rhythm, S1, S2 normal, no murmur, click, rub or gallop Lungs: clear to auscultation bilaterally Abdomen: soft, non-tender; bowel sounds normal; no masses,  no organomegaly Extremities: extremities normal, atraumatic, no cyanosis or edema and Homans sign is negative, no sign of DVT Wound: sternum intact  Lab Results: CBC: Recent Labs    05/05/18 1700 05/06/18 0301  WBC 10.7* 9.8  HGB 10.0* 9.6*  HCT 31.6* 30.4*  PLT 169 161   BMET:  Recent Labs    05/05/18 1700 05/06/18 0301  NA 135 133*  K 3.5 4.5  CL 108 103  CO2 22 25  GLUCOSE 168* 130*  BUN 11 10  CREATININE 0.94 0.90  CALCIUM 7.2* 8.1*    CMET: Lab Results  Component Value Date   WBC 9.8 05/06/2018   HGB 9.6 (L) 05/06/2018   HCT 30.4 (L) 05/06/2018   PLT 161 05/06/2018   GLUCOSE 130 (H) 05/06/2018   CHOL 270 (H) 03/04/2015   TRIG 217.0 (H) 03/04/2015   HDL 40.80 03/04/2015   LDLDIRECT 182.0 03/04/2015   LDLCALC 205 (H) 05/23/2013   ALT 18 04/07/2015   AST 20 04/07/2015   NA 133 (L) 05/06/2018   K 4.5 05/06/2018   CL 103 05/06/2018    CREATININE 0.90 05/06/2018   BUN 10 05/06/2018   CO2 25 05/06/2018   TSH 1.14 03/04/2015   INR 1.5 (H) 05/04/2018   HGBA1C 5.3 05/06/2018      PT/INR:  Recent Labs    05/04/18 1414  LABPROT 17.6*  INR 1.5*   Radiology: Dg Chest Port 1 View  Result Date: 05/06/2018 CLINICAL DATA:  Status post CABG surgery. EXAM: PORTABLE CHEST 1 VIEW COMPARISON:  05/05/2018 and older exams. FINDINGS: All lines and tubes have been removed with the exception of the right internal jugular introducer sheath. Cardiac silhouette is normal in size and configuration. No mediastinal widening. Linear/discoid opacities noted at the lung bases consistent with atelectasis. Remainder of the lungs is clear. No pneumothorax. IMPRESSION: 1. No acute findings or evidence of an operative complication. 2. Status post removal of remaining lines and tubes with the exception of the right internal jugular introducer sheath. No pneumothorax. Electronically Signed   By: Amie Portland M.D.   On: 05/06/2018 08:48     Assessment/Plan: S/P Procedure(s) (LRB): CORONARY ARTERY BYPASS GRAFTING (CABG) x5 using right greater saphenous vein harvested endoscopically and bilateral left internal mammary arteries (N/A) TRANSESOPHAGEAL ECHOCARDIOGRAM (TEE) (N/A) Mobilize Diuresis Diabetes control Plan for transfer to step-down: see transfer orders Renal function stable     Delight Ovens 05/06/2018 9:33 AM

## 2018-05-07 ENCOUNTER — Inpatient Hospital Stay (HOSPITAL_COMMUNITY): Payer: Managed Care, Other (non HMO)

## 2018-05-07 LAB — GLUCOSE, CAPILLARY
GLUCOSE-CAPILLARY: 92 mg/dL (ref 70–99)
Glucose-Capillary: 98 mg/dL (ref 70–99)
Glucose-Capillary: 103 mg/dL — ABNORMAL HIGH (ref 70–99)
Glucose-Capillary: 115 mg/dL — ABNORMAL HIGH (ref 70–99)

## 2018-05-07 LAB — BASIC METABOLIC PANEL
Anion gap: 7 (ref 5–15)
BUN: 15 mg/dL (ref 6–20)
CO2: 26 mmol/L (ref 22–32)
Calcium: 8.2 mg/dL — ABNORMAL LOW (ref 8.9–10.3)
Chloride: 103 mmol/L (ref 98–111)
Creatinine, Ser: 0.98 mg/dL (ref 0.61–1.24)
GFR calc Af Amer: >60 mL/min (ref >60)
GFR calc non Af Amer: >60 mL/min (ref >60)
Glucose, Bld: 114 mg/dL — ABNORMAL HIGH (ref 70–99)
Potassium: 3.9 mmol/L (ref 3.5–5.1)
Sodium: 136 mmol/L (ref 135–145)

## 2018-05-07 LAB — CBC
HCT: 30.4 % — ABNORMAL LOW (ref 39.0–52.0)
Hemoglobin: 9.9 g/dL — ABNORMAL LOW (ref 13.0–17.0)
MCH: 29.6 pg (ref 26.0–34.0)
MCHC: 32.6 g/dL (ref 30.0–36.0)
MCV: 90.7 fL (ref 80.0–100.0)
Platelets: 167 10*3/uL (ref 150–400)
RBC: 3.35 MIL/uL — ABNORMAL LOW (ref 4.22–5.81)
RDW: 14 % (ref 11.5–15.5)
WBC: 8.8 10*3/uL (ref 4.0–10.5)
nRBC: 0 % (ref 0.0–0.2)

## 2018-05-07 NOTE — Progress Notes (Signed)
Pacing wires d/c'd with no resistance.  No bleeding noted at site.  Pt remains in bed.  Will continue to closely monitor.

## 2018-05-07 NOTE — Progress Notes (Signed)
Patient ID: David Christian, male   DOB: 01-25-1976, 43 y.o.   MRN: [id number] [facility] DAILY ICU PROGRESS NOTE                   [address]            [place] [address]          [phone]   3 Days Post-Op Procedure(s) (LRB): CORONARY ARTERY BYPASS GRAFTING (CABG) x5 using right greater saphenous vein harvested endoscopically and bilateral left internal mammary arteries (N/A) TRANSESOPHAGEAL ECHOCARDIOGRAM (TEE) (N/A)  Total Length of Stay:  LOS: 4 days   Subjective: Stable night, pain better controlled   Objective: Vital signs in last 24 hours: Temp:  [98.9 F (37.2 C)-99.5 F (37.5 C)] 99.5 F (37.5 C) (03/01 0400) Pulse Rate:  [92-121] 115 (03/01 0920) Cardiac Rhythm: Sinus tachycardia (03/01 0000) Resp:  [11-22] 20 (03/01 0920) BP: (90-121)/(43-80) 112/80 (03/01 0900) SpO2:  [93 %-99 %] 95 % (03/01 0920) Weight:  [117.9 kg] 117.9 kg (03/01 0500)  Filed Weights   05/05/18 0500 05/06/18 0500 05/07/18 0500  Weight: 103.3 kg 102.5 kg 117.9 kg    Weight change: 15.4 kg   Hemodynamic parameters for last 24 hours:    Intake/Output from previous day: 02/29 0701 - 03/01 0700 In: 123 [P.O.:120; I.V.:3] Out: 750 [Urine:750]  Intake/Output this shift: No intake/output data recorded.  Current Meds: Scheduled Meds: . aspirin EC  325 mg Oral Daily  . atorvastatin  80 mg Oral QPM  . docusate sodium  200 mg Oral Daily  . enoxaparin (LOVENOX) injection  40 mg Subcutaneous QHS  . furosemide  20 mg Oral Daily  . insulin aspart  0-24 Units Subcutaneous TID AC & HS  . mouth rinse  15 mL Mouth Rinse BID  . metoprolol tartrate  12.5 mg Oral BID  . moving right along book   Does not apply Once  . pantoprazole  40 mg Oral QAC breakfast  . sodium chloride flush  3 mL Intravenous Q12H   Continuous Infusions: . sodium chloride     PRN Meds:.sodium chloride, acetaminophen, bisacodyl **OR** bisacodyl, guaiFENesin, ondansetron **OR** ondansetron (ZOFRAN) IV,  oxyCODONE, sodium chloride flush, traMADol  General appearance: alert, cooperative and no distress Neurologic: intact Heart: regular rate and rhythm, S1, S2 normal, no murmur, click, rub or gallop Lungs: clear to auscultation bilaterally Abdomen: soft, non-tender; bowel sounds normal; no masses,  no organomegaly Extremities: extremities normal, atraumatic, no cyanosis or edema and Homans sign is negative, no sign of DVT Wound: sternum stable   Lab Results: CBC: Recent Labs    05/06/18 0301 05/07/18 0350  WBC 9.8 8.8  HGB 9.6* 9.9*  HCT 30.4* 30.4*  PLT 161 167   BMET:  Recent Labs    05/06/18 0301 05/07/18 0350  NA 133* 136  K 4.5 3.9  CL 103 103  CO2 25 26  GLUCOSE 130* 114*  BUN 10 15  CREATININE 0.90 0.98  CALCIUM 8.1* 8.2*    CMET: Lab Results  Component Value Date   WBC 8.8 05/07/2018   HGB 9.9 (L) 05/07/2018   HCT 30.4 (L) 05/07/2018   PLT 167 05/07/2018   GLUCOSE 114 (H) 05/07/2018   CHOL 270 (H) 03/04/2015   TRIG 217.0 (H) 03/04/2015   HDL 40.80 03/04/2015   LDLDIRECT 182.0 03/04/2015   LDLCALC 205 (H) 05/23/2013   ALT 18 04/07/2015   AST 20 04/07/2015   NA 136 05/07/2018   K  3.9 05/07/2018   CL 103 05/07/2018   CREATININE 0.98 05/07/2018   BUN 15 05/07/2018   CO2 26 05/07/2018   TSH 1.14 03/04/2015   INR 1.5 (H) 05/04/2018   HGBA1C 5.3 05/06/2018      PT/INR:  Recent Labs    05/04/18 1414  LABPROT 17.6*  INR 1.5*   Radiology: Dg Chest 2 View  Result Date: 05/07/2018 CLINICAL DATA:  Reason for exam: Postop check. CABG surgery done on 05/04/18,Thursday. Slight chest discomfort per pt area of surgery. Hx of HTN, CAD. EXAM: CHEST - 2 VIEW COMPARISON:  05/06/2018 FINDINGS: Low lung volumes. Some increase in central pulmonary vascular congestion. Linear atelectasis or scarring in the lung bases as before, left greater than right. Left infrahilar consolidation/atelectasis. Small pleural effusions. Heart size and mediastinal contours are within  normal limits. Previous CABG. No pneumothorax. Sternotomy wires. IMPRESSION: 1. Low volumes with some increase in central pulmonary vascular congestion. 2. Small pleural effusions. Electronically Signed   By: Corlis Leak M.D.   On: 05/07/2018 08:08     Assessment/Plan: S/P Procedure(s) (LRB): CORONARY ARTERY BYPASS GRAFTING (CABG) x5 using right greater saphenous vein harvested endoscopically and bilateral left internal mammary arteries (N/A) TRANSESOPHAGEAL ECHOCARDIOGRAM (TEE) (N/A) Mobilize Waiting for 4E  bed , transferred > 24 hours ago D/c pacing wires Poss home 1-2 days    Delight Ovens 05/07/2018 9:57 AM

## 2018-05-07 NOTE — Progress Notes (Signed)
Pt arrived to 4e from 2h. Pt oriented to room and staff. Vitals obtained and telemetry box applied. CCMD notified x2. Pt denies needs right now. Will continue current plan of care.   Ardeen Jourdain BSN, RN

## 2018-05-07 NOTE — Progress Notes (Signed)
Transported pt in wheelchair to/from XR for 2-view without complications.  Pt resting comfortably in chair w/ breakfast now.

## 2018-05-08 DIAGNOSIS — I25118 Atherosclerotic heart disease of native coronary artery with other forms of angina pectoris: Principal | ICD-10-CM

## 2018-05-08 LAB — GLUCOSE, CAPILLARY
GLUCOSE-CAPILLARY: 116 mg/dL — AB (ref 70–99)
GLUCOSE-CAPILLARY: 110 mg/dL — AB (ref 70–99)
Glucose-Capillary: 110 mg/dL — ABNORMAL HIGH (ref 70–99)

## 2018-05-08 MED ORDER — LACTULOSE 10 GM/15ML PO SOLN
20.0000 g | Freq: Once | ORAL | Status: AC
  Administered 2018-05-08: 20 g via ORAL
  Filled 2018-05-08: qty 30

## 2018-05-08 MED ORDER — METOPROLOL TARTRATE 25 MG PO TABS
25.0000 mg | ORAL_TABLET | Freq: Two times a day (BID) | ORAL | Status: DC
  Administered 2018-05-08 – 2018-05-09 (×2): 25 mg via ORAL
  Filled 2018-05-08 (×2): qty 1

## 2018-05-08 NOTE — Progress Notes (Signed)
Progress Note  Patient Name: David Christian Date of Encounter: 05/08/2018  Primary Cardiologist:  Croitoru   Subjective   43 year old gentleman with a history of coronary artery disease and carotid artery disease.  He is now status post coronary artery bypass grafting.  He will need to have his right carotid neurectomy after he heals up from his bypass surgery.  He remains mildly tachycardic.  His blood pressure is borderline low.  Inpatient Medications    Scheduled Meds: . aspirin EC  325 mg Oral Daily  . atorvastatin  80 mg Oral QPM  . docusate sodium  200 mg Oral Daily  . enoxaparin (LOVENOX) injection  40 mg Subcutaneous QHS  . mouth rinse  15 mL Mouth Rinse BID  . metoprolol tartrate  12.5 mg Oral BID  . moving right along book   Does not apply Once  . pantoprazole  40 mg Oral QAC breakfast  . sodium chloride flush  3 mL Intravenous Q12H   Continuous Infusions: . sodium chloride     PRN Meds: sodium chloride, acetaminophen, bisacodyl **OR** bisacodyl, ondansetron **OR** ondansetron (ZOFRAN) IV, oxyCODONE, sodium chloride flush, traMADol   Vital Signs    Vitals:   05/07/18 2346 05/08/18 0407 05/08/18 0759 05/08/18 1211  BP: 116/71 113/67 114/69 112/66  Pulse: (!) 108 (!) 103 100 98  Resp: 20 17 18 20   Temp: 99.3 F (37.4 C) 98.3 F (36.8 C) 98.3 F (36.8 C) 98.3 F (36.8 C)  TempSrc: Oral Oral Oral Oral  SpO2: 93% 91% 94% 96%  Weight:  123.9 kg    Height:        Intake/Output Summary (Last 24 hours) at 05/08/2018 1426 Last data filed at 05/08/2018 0912 Gross per 24 hour  Intake 670 ml  Output -  Net 670 ml   Last 3 Weights 05/08/2018 05/07/2018 05/06/2018  Weight (lbs) 273 lb 1.6 oz 260 lb 226 lb  Weight (kg) 123.877 kg 117.935 kg 102.513 kg      Telemetry    Sinus tach  - Personally Reviewed  ECG      - Personally Reviewed  Physical Exam   GEN: No acute distress.   Neck: No JVD Cardiac: RRR,  Tachycardic  Respiratory: Clear to auscultation  bilaterally. GI: Soft, nontender, non-distended  MS: No edema; No deformity. Neuro:  Nonfocal  Psych: Normal affect   Labs    Chemistry Recent Labs  Lab 05/05/18 1700 05/06/18 0301 05/07/18 0350  NA 135 133* 136  K 3.5 4.5 3.9  CL 108 103 103  CO2 22 25 26   GLUCOSE 168* 130* 114*  BUN 11 10 15   CREATININE 0.94 0.90 0.98  CALCIUM 7.2* 8.1* 8.2*  GFRNONAA >60 >60 >60  GFRAA >60 >60 >60  ANIONGAP 5 5 7      Hematology Recent Labs  Lab 05/05/18 1700 05/06/18 0301 05/07/18 0350  WBC 10.7* 9.8 8.8  RBC 3.40* 3.27* 3.35*  HGB 10.0* 9.6* 9.9*  HCT 31.6* 30.4* 30.4*  MCV 92.9 93.0 90.7  MCH 29.4 29.4 29.6  MCHC 31.6 31.6 32.6  RDW 14.0 13.9 14.0  PLT 169 161 167    Cardiac EnzymesNo results for input(s): TROPONINI in the last 168 hours. No results for input(s): TROPIPOC in the last 168 hours.   BNPNo results for input(s): BNP, PROBNP in the last 168 hours.   DDimer No results for input(s): DDIMER in the last 168 hours.   Radiology    Dg Chest 2 View  Result  Date: 05/07/2018 CLINICAL DATA:  Reason for exam: Postop check. CABG surgery done on 05/04/18,Thursday. Slight chest discomfort per pt area of surgery. Hx of HTN, CAD. EXAM: CHEST - 2 VIEW COMPARISON:  05/06/2018 FINDINGS: Low lung volumes. Some increase in central pulmonary vascular congestion. Linear atelectasis or scarring in the lung bases as before, left greater than right. Left infrahilar consolidation/atelectasis. Small pleural effusions. Heart size and mediastinal contours are within normal limits. Previous CABG. No pneumothorax. Sternotomy wires. IMPRESSION: 1. Low volumes with some increase in central pulmonary vascular congestion. 2. Small pleural effusions. Electronically Signed   By: Corlis Leak M.D.   On: 05/07/2018 08:08    Cardiac Studies     Patient Profile     43 y.o. male with CAD and carotid artery disease.   S/p CABG   Assessment & Plan    1.  Coronary artery bypass grafting: The patient  is doing well from a bypass standpoint.  He remains mildly tachycardic.  We will increase his metoprolol to 25 mg twice a day.  Encouraged him to eat and drink well in an effort to keep his blood pressure high enough .      For questions or updates, please contact [facility] Please consult [email or web address] for contact info under        Signed, Kristeen Miss, MD  05/08/2018, 2:26 PM

## 2018-05-08 NOTE — Progress Notes (Signed)
CARDIAC REHAB PHASE I   PRE:  Rate/Rhythm: 99 SR  BP:  Sitting: 114/69      SaO2: 96 RA  MODE:  Ambulation: 470 ft 115 peak HR  POST:  Rate/Rhythm: 103 ST  BP:  Sitting: 119/80    SaO2: 98 RA   Pt ambulated 451ft in hallway standby assist with front wheel walker. Pt took one standing rest break d/t "tunnnel vision". Pt guided through purse lipped breathing. Pt denies SOB, and states pain is manageable. Pt and wife concerned about dizzy episodes last night, and are wanting pt to stay again tonight to make sure he is ok to go home. Pt concerned with stairs at home, PT requested. Pt also requesting front wheel walker and 3-in-1, CM made aware. Encouraged two more walks today and IS use. Will continue to follow.  [id number] Reynold Bowen, RN BSN 05/08/2018 10:03 AM

## 2018-05-08 NOTE — Progress Notes (Signed)
Physical Therapy Evaluation Patient Details Name: David Christian MRN: [id number] DOB: [birth year] Today's Date: 05/08/2018   History of Present Illness  Patient is 43 year old male s/p CABG x5. PMH includes HTN, HLD, and sleep apnea.   Clinical Impression  Patient admitted to hospital secondary to problems above and with deficits below. Patient ambulated with min guard-supervision with and without use of RW. No LOB noted. Patient fearful with stair navigation. Educated about sideways technique to allow use of BUE for balance. Educated and reviewed sternal precautions with patient and family. Given functional mobility deficits recommending, HHPT following d/c to regain baseline strength and mobility. Patient will benefit from acute physical therapy to maximize independence and safety with functional mobility.     Follow Up Recommendations Home health PT    Equipment Recommendations  Rolling walker with 5" wheels;3in1 (PT)    Recommendations for Other Services       Precautions / Restrictions Precautions Precautions: Sternal Precaution Booklet Issued: No Precaution Comments: reviewed sternal precautions with patient Restrictions Weight Bearing Restrictions: No      Mobility  Bed Mobility               General bed mobility comments: Patient in recliner upon arrival  Transfers Overall transfer level: Needs assistance Equipment used: Rolling walker (2 wheeled) Transfers: Sit to/from Stand Sit to Stand: Min assist         General transfer comment: Patient required minA for lift assist to stand with use of RW for safety. Patient denies dizziness in standing. Patient holding pillow to maintain sternal precautions.  Ambulation/Gait Ambulation/Gait assistance: Supervision;Min guard Gait Distance (Feet): 300 Feet Assistive device: Rolling walker (2 wheeled);None Gait Pattern/deviations: Step-through pattern;Decreased stride length Gait velocity: decreased Gait velocity  interpretation: <1.8 ft/sec, indicate of risk for recurrent falls General Gait Details: Patient ambulated with min guard-supervision with use of RW for safety. Ambualted short distance in room without AD. No LOB noted. Patient denies dizziness throughout ambulation.    Stairs Stairs: Yes Stairs assistance: Min guard Stair Management: One rail Right;Step to pattern;Sideways;Backwards Number of Stairs: 2 General stair comments: Patient completed stair navigation with min guard for safety. Patient with increased fear during stair navigation. Completed and educated about step to pattern for increased ease and comfort. Educated and practiced sideways technique while descending stairs. Educated about no pushing/pulling with use of hand rail to maintain sternal precautions  Wheelchair Mobility    Modified Rankin (Stroke Patients Only)       Balance Overall balance assessment: Needs assistance Sitting-balance support: Feet supported;No upper extremity supported Sitting balance-Leahy Scale: Good     Standing balance support: Bilateral upper extremity supported;No upper extremity supported Standing balance-Leahy Scale: Fair Standing balance comment: Patient ambulated with and without RW with no LOB                             Pertinent Vitals/Pain Pain Assessment: 0-10 Pain Score: 5  Pain Location: chest Pain Descriptors / Indicators: Operative site guarding;Discomfort Pain Intervention(s): Monitored during session;Limited activity within patient's tolerance    Home Living Family/patient expects to be discharged to:: Private residence Living Arrangements: Spouse/significant other Available Help at Discharge: Family;Available 24 hours/day Type of Home: House Home Access: Stairs to enter Entrance Stairs-Rails: Right Entrance Stairs-Number of Steps: 5 Home Layout: Multi-level;Bed/bath upstairs Home Equipment: None      Prior Function Level of Independence: Independent  Hand Dominance        Extremity/Trunk Assessment   Upper Extremity Assessment Upper Extremity Assessment: RUE deficits/detail;LUE deficits/detail RUE Deficits / Details: Able to perform ROM within sternal precautions LUE Deficits / Details: Able to perform ROM within sternal precautions    Lower Extremity Assessment Lower Extremity Assessment: Generalized weakness    Cervical / Trunk Assessment Cervical / Trunk Assessment: Normal  Communication   Communication: No difficulties  Cognition Arousal/Alertness: Awake/alert Behavior During Therapy: WFL for tasks assessed/performed Overall Cognitive Status: Within Functional Limits for tasks assessed                                        General Comments General comments (skin integrity, edema, etc.): Educated about energy conservation and seated rest breaks as needed. Educated about placement of chair at top of stair case for seated rest if needed following stair navigation.     Exercises     Assessment/Plan    PT Assessment Patient needs continued PT services  PT Problem List Decreased strength;Decreased range of motion;Decreased activity tolerance;Decreased balance;Decreased mobility;Decreased knowledge of use of DME;Decreased knowledge of precautions;Pain       PT Treatment Interventions DME instruction;Gait training;Stair training;Functional mobility training;Therapeutic activities;Therapeutic exercise;Balance training;Patient/family education    PT Goals (Current goals can be found in the Care Plan section)  Acute Rehab PT Goals Patient Stated Goal: go home PT Goal Formulation: With patient Time For Goal Achievement: 05/22/18 Potential to Achieve Goals: Good    Frequency Min 3X/week   Barriers to discharge        Co-evaluation               AM-PAC PT "6 Clicks" Mobility  Outcome Measure Help needed turning from your back to your side while in a flat bed without  using bedrails?: None Help needed moving from lying on your back to sitting on the side of a flat bed without using bedrails?: None Help needed moving to and from a bed to a chair (including a wheelchair)?: None Help needed standing up from a chair using your arms (e.g., wheelchair or bedside chair)?: A Little Help needed to walk in hospital room?: A Little Help needed climbing 3-5 steps with a railing? : A Little 6 Click Score: 21    End of Session Equipment Utilized During Treatment: Gait belt Activity Tolerance: Patient tolerated treatment well Patient left: in chair;with call bell/phone within reach;with family/visitor present Nurse Communication: Mobility status PT Visit Diagnosis: Other abnormalities of gait and mobility (R26.89);Muscle weakness (generalized) (M62.81)    Time: 2751-7001 PT Time Calculation (min) (ACUTE ONLY): 22 min   Charges:   PT Evaluation $PT Eval Low Complexity: 1 Low          Vanessa Ralphs, SPT  Vanessa Ralphs 05/08/2018, 1:21 PM

## 2018-05-08 NOTE — Progress Notes (Signed)
Patient has not had a BM since 05/02/2018, Donielle PA paged, verbal order received for Lactulose 20g PO one time. Will continue to monitor.

## 2018-05-08 NOTE — Progress Notes (Signed)
On this shift, pattient walked several times in the hall using a walker, ambulation well tolerated will continue to monitor.

## 2018-05-08 NOTE — Progress Notes (Signed)
4 Days Post-Op Procedure(s) (LRB): CORONARY ARTERY BYPASS GRAFTING (CABG) x5 using right greater saphenous vein harvested endoscopically and bilateral left internal mammary arteries (N/A) TRANSESOPHAGEAL ECHOCARDIOGRAM (TEE) (N/A) Subjective: C/o feeling a little dizziness when first getting up Some pain, he is trying to "stretch" pain meds  Objective: Vital signs in last 24 hours: Temp:  [98.3 F (36.8 C)-100.3 F (37.9 C)] 98.3 F (36.8 C) (03/02 0759) Pulse Rate:  [96-213] 100 (03/02 0759) Cardiac Rhythm: Sinus tachycardia (03/02 0450) Resp:  [16-22] 18 (03/02 0759) BP: (99-116)/(43-80) 114/69 (03/02 0759) SpO2:  [91 %-100 %] 94 % (03/02 0759) Weight:  [123.9 kg] 123.9 kg (03/02 0407)  Hemodynamic parameters for last 24 hours:    Intake/Output from previous day: 03/01 0701 - 03/02 0700 In: 670 [P.O.:670] Out: -  Intake/Output this shift: No intake/output data recorded.  General appearance: alert, cooperative and no distress Neurologic: intact Heart: regular rate and rhythm Lungs: diminished breath sounds bibasilar Abdomen: normal findings: soft, non-tender Wound: clean and dry  Lab Results: Recent Labs    05/06/18 0301 05/07/18 0350  WBC 9.8 8.8  HGB 9.6* 9.9*  HCT 30.4* 30.4*  PLT 161 167   BMET:  Recent Labs    05/06/18 0301 05/07/18 0350  NA 133* 136  K 4.5 3.9  CL 103 103  CO2 25 26  GLUCOSE 130* 114*  BUN 10 15  CREATININE 0.90 0.98  CALCIUM 8.1* 8.2*    PT/INR: No results for input(s): LABPROT, INR in the last 72 hours. ABG    Component Value Date/Time   PHART 7.360 05/04/2018 2026   HCO3 21.4 05/04/2018 2026   TCO2 22 05/04/2018 2026   ACIDBASEDEF 4.0 (H) 05/04/2018 2026   O2SAT 96.0 05/04/2018 2026   CBG (last 3)  Recent Labs    05/07/18 1618 05/07/18 2111 05/08/18 0620  GLUCAP 92 115* 110*    Assessment/Plan: S/P Procedure(s) (LRB): CORONARY ARTERY BYPASS GRAFTING (CABG) x5 using right greater saphenous vein harvested  endoscopically and bilateral left internal mammary arteries (N/A) TRANSESOPHAGEAL ECHOCARDIOGRAM (TEE) (N/A) -POD # 4  In SR, pacing wires removed yesterday Mildly orthostatic- cautioned to get up slowly, wait briefly before beginning to walk Dc CBG/ SSI Possibly home later today- will see how he does with his walk   LOS: 5 days    [name] 05/08/2018

## 2018-05-09 MED ORDER — ATORVASTATIN CALCIUM 80 MG PO TABS: 80.0000 mg | ORAL_TABLET | Freq: Every evening | ORAL | 1 refills | Status: DC

## 2018-05-09 MED ORDER — METOPROLOL TARTRATE 25 MG PO TABS: 25.0000 mg | ORAL_TABLET | Freq: Two times a day (BID) | ORAL | 1 refills | Status: DC

## 2018-05-09 MED ORDER — OXYCODONE HCL 5 MG PO TABS: 5.0000 mg | ORAL_TABLET | Freq: Four times a day (QID) | ORAL | 0 refills | Status: DC | PRN

## 2018-05-09 MED ORDER — ASPIRIN 325 MG PO TBEC: 325.0000 mg | DELAYED_RELEASE_TABLET | Freq: Every day | ORAL | Status: DC

## 2018-05-09 MED ORDER — ACETAMINOPHEN 325 MG PO TABS: 650.0000 mg | ORAL_TABLET | Freq: Four times a day (QID) | ORAL | Status: DC | PRN

## 2018-05-09 MED FILL — Sodium Bicarbonate IV Soln 8.4%: INTRAVENOUS | Qty: 50 | Status: AC

## 2018-05-09 MED FILL — Heparin Sodium (Porcine) Inj 1000 Unit/ML: INTRAMUSCULAR | Qty: 10 | Status: AC

## 2018-05-09 MED FILL — Lidocaine HCl(Cardiac) IV PF Soln Pref Syr 100 MG/5ML (2%): INTRAVENOUS | Qty: 5 | Status: AC

## 2018-05-09 MED FILL — Magnesium Sulfate Inj 50%: INTRAMUSCULAR | Qty: 10 | Status: AC

## 2018-05-09 MED FILL — Electrolyte-R (PH 7.4) Solution: INTRAVENOUS | Qty: 4000 | Status: AC

## 2018-05-09 MED FILL — Heparin Sodium (Porcine) Inj 1000 Unit/ML: INTRAMUSCULAR | Qty: 30 | Status: AC

## 2018-05-09 MED FILL — Sodium Chloride IV Soln 0.9%: INTRAVENOUS | Qty: 2000 | Status: AC

## 2018-05-09 MED FILL — Mannitol IV Soln 20%: INTRAVENOUS | Qty: 500 | Status: AC

## 2018-05-09 MED FILL — Potassium Chloride Inj 2 mEq/ML: INTRAVENOUS | Qty: 40 | Status: AC

## 2018-05-09 NOTE — Care Management Note (Signed)
Case Management Note Donn Pierini RN, BSN Transitions of Care Unit 4E- RN Case Manager [phone]  Patient Details  Name: David Christian MRN: [id number] Date of Birth: [birth year]  Subjective/Objective:    Pt admitted Angina, s/p CABG x5                Action/Plan: PTA pt lived at home with spouse, plan to return home with spouse, orders placed for HHPT and DME RW and 3n1, CM spoke with pt and wife at bedside choice offered for [facility] agency with list provided Per CMS guidelines from medicare.gov website with star ratings (copy placed in shadow chart)- pt and wife state they have no preference, pt has [employer] and will need to go through [facility] hub for authorization- call made to [facility]- spoke with Judeth Cornfield and provided referral for HHPT needs- intake reference # [id number]- faxed orders and needed paperwork to [facility]. - once processes and HH agency found- [facility] will contact pt with agency that will provide HHPT services. Call made to [facility] with [facility] for DME needs- RW and 3n1 to be delivered to room prior to discharge. Address, phone #s and PCP all confirmed with pt in epic.   Expected Discharge Date:  05/09/18               Expected Discharge Plan:  Home w Home Health Services  In-House Referral:     Discharge planning Services  CM Consult  Post Acute Care Choice:  Home Health, Durable Medical Equipment Choice offered to:  Spouse, Patient  DME Arranged:  3-N-1, Walker rolling DME Agency:  [facility]  HH Arranged:  PT HH Agency:  Other - See comment  Status of Service:  Completed, signed off  If discussed at Long Length of Stay Meetings, dates discussed:    Discharge Disposition: home/home health    Additional Comments:  Darrold Span, RN 05/09/2018, 11:40 AM

## 2018-05-09 NOTE — Progress Notes (Signed)
Patient in a  stable condition, discharge education reviewed with patient he verbalized understanding, iv removed, tele dc, ccmd notified, chest tube sutures removed, equipment for home use at bedside, patient to be transported home by his wife.

## 2018-05-09 NOTE — Plan of Care (Addendum)
Care plan has been reviewed: progressing clinical outcomes.  Problem: Cardiovascular: sinus tachycardia  and normal sinus rhythm on monitor, MD increased Metoprolol from 12.5 mg to to 25 mg BID yesterday.   Goal: Ability to achieve and maintain adequate cardiovascular perfusion will improve,Cardiovascular complication will be avoided Outcome: Progressing: ambulated independently multiple times in hall way, tolerated activities well, no acute distress had seen. HR 90s-120, Vital sing remained stable.  Problem: Clinical Measurements: Post op CABG x 5 with sternal wound. Goal: Will remain free from infection Outcome: Progressing: afebrile, surgical wound was healing well, clean dry and intact, no drainage, painted with Betadine, no sings of infection had seen.  Problem: Elimination: Complained having constipation, Lactulose and Dulcolax given yesterday. Pt reported unsuccessfully having only minimal hard stool. Goal: Will not experience complications related to bowel motility Outcome: Progressing: offered Dulcolax suppository this morning, Pt requested to post pone suppository later, he needed to sleep longer to night. Will hand off to day shift RN. Encouraged to drink adequate fluid and fiber from fruit and vegetable. Explained the constipation could be from side effect from opioid. Pt had well understanding by teaching back.      Problem: Pain Managment: requested pain med ( Oxycodone 10 mg q 4 hr), pain scale 7-8/10.  Goal: General experience of comfort will improve Outcome: Progressing: pain was well controlled, slept well through the night.  Filiberto Pinks, BSN,RN,PCCN-CMC

## 2018-05-09 NOTE — Progress Notes (Signed)
      [address]       [place] [address]             [phone]      5 Days Post-Op Procedure(s) (LRB): CORONARY ARTERY BYPASS GRAFTING (CABG) x5 using right greater saphenous vein harvested endoscopically and bilateral left internal mammary arteries (N/A) TRANSESOPHAGEAL ECHOCARDIOGRAM (TEE) (N/A) Subjective: Looks and feels better today, no dizziness  Objective: Vital signs in last 24 hours: Temp:  [98 F (36.7 C)-98.6 F (37 C)] 98.4 F (36.9 C) (03/03 0833) Pulse Rate:  [95-114] 95 (03/03 0833) Cardiac Rhythm: Normal sinus rhythm (03/03 0742) Resp:  [13-21] 16 (03/03 0833) BP: (106-120)/(61-79) 109/61 (03/03 0833) SpO2:  [93 %-99 %] 93 % (03/03 0833) Weight:  [124.6 kg] 124.6 kg (03/03 0510)  Hemodynamic parameters for last 24 hours:    Intake/Output from previous day: 03/02 0701 - 03/03 0700 In: 620 [P.O.:620] Out: -  Intake/Output this shift: No intake/output data recorded.  General appearance: alert, cooperative and no distress Heart: regular rate and rhythm Lungs: clear to auscultation bilaterally Abdomen: benign Extremities: min edema Wound: incis healing well  Lab Results: Recent Labs    05/07/18 0350  WBC 8.8  HGB 9.9*  HCT 30.4*  PLT 167   BMET:  Recent Labs    05/07/18 0350  NA 136  K 3.9  CL 103  CO2 26  GLUCOSE 114*  BUN 15  CREATININE 0.98  CALCIUM 8.2*    PT/INR: No results for input(s): LABPROT, INR in the last 72 hours. ABG    Component Value Date/Time   PHART 7.360 05/04/2018 2026   HCO3 21.4 05/04/2018 2026   TCO2 22 05/04/2018 2026   ACIDBASEDEF 4.0 (H) 05/04/2018 2026   O2SAT 96.0 05/04/2018 2026   CBG (last 3)  Recent Labs    05/08/18 0620 05/08/18 1103 05/08/18 1613  GLUCAP 110* 116* 110*    Meds Scheduled Meds: . aspirin EC  325 mg Oral Daily  . atorvastatin  80 mg Oral QPM  . docusate sodium  200 mg Oral Daily  . enoxaparin (LOVENOX) injection  40 mg Subcutaneous QHS  . mouth  rinse  15 mL Mouth Rinse BID  . metoprolol tartrate  25 mg Oral BID  . moving right along book   Does not apply Once  . pantoprazole  40 mg Oral QAC breakfast  . sodium chloride flush  3 mL Intravenous Q12H   Continuous Infusions: . sodium chloride     PRN Meds:.sodium chloride, acetaminophen, bisacodyl **OR** bisacodyl, ondansetron **OR** ondansetron (ZOFRAN) IV, oxyCODONE, sodium chloride flush, traMADol  Xrays No results found.  Assessment/Plan: S/P Procedure(s) (LRB): CORONARY ARTERY BYPASS GRAFTING (CABG) x5 using right greater saphenous vein harvested endoscopically and bilateral left internal mammary arteries (N/A) TRANSESOPHAGEAL ECHOCARDIOGRAM (TEE) (N/A)  1 afebrile with stable hemodynamics, sinus rhythm, some sinus tachy 2 sats good on RA 3 BS well controlled, HGB A1C 5.3 4 no new labs 5 stable for discharge   LOS: 6 days    Rowe Clack [facility] 05/09/2018 Pager [phone]

## 2018-05-09 NOTE — Progress Notes (Signed)
    HR is better  Metoprolol was increased to 25 mg PO BID yesterday and HR is much better.   DC to home today He will follow up with is in several weeks     Kristeen Miss, MD  05/09/2018 12:01 PM    [facility] [address] [place],  [address] [place], [place]  [address] Pager [phone] Phone: [phone]; Fax: [phone]

## 2018-05-09 NOTE — Progress Notes (Signed)
Physical Therapy Treatment Patient Details Name: David Christian MRN: [id number] DOB: [birth year] Today's Date: 05/09/2018    History of Present Illness Patient is 43 year old male s/p CABG x5. PMH includes HTN, HLD, and sleep apnea.     PT Comments    Patient seen for mobility progression, stair negotiation, and education for home. Spoke with patient regarding car transfers, mobility expectations, positioning, and safety. Patient receptive. No physical assist required for mobility today. Anticipate patient will be safe for d/c home.    Follow Up Recommendations  Home health PT     Equipment Recommendations  Rolling walker with 5" wheels;3in1 (PT)    Recommendations for Other Services       Precautions / Restrictions Precautions Precautions: Sternal Precaution Booklet Issued: No Precaution Comments: reviewed sternal precautions with patient Restrictions Weight Bearing Restrictions: No    Mobility  Bed Mobility               General bed mobility comments: Patient in recliner upon arrival  Transfers Overall transfer level: Needs assistance Equipment used: None Transfers: Sit to/from Stand Sit to Stand: Supervision         General transfer comment: No physical assist required  Ambulation/Gait Ambulation/Gait assistance: Supervision Gait Distance (Feet): 90 Feet Assistive device: None Gait Pattern/deviations: Step-through pattern;Decreased stride length Gait velocity: decreased Gait velocity interpretation: <1.8 ft/sec, indicate of risk for recurrent falls General Gait Details: No physical assist required, decreased cadence   Stairs Stairs: Yes Stairs assistance: Supervision Stair Management: One rail Left Number of Stairs: 5 General stair comments: Superivision for safety, no physical assist required.   Wheelchair Mobility    Modified Rankin (Stroke Patients Only)       Balance Overall balance assessment: Needs assistance Sitting-balance  support: Feet supported;No upper extremity supported Sitting balance-Leahy Scale: Good     Standing balance support: During functional activity Standing balance-Leahy Scale: Good                              Cognition Arousal/Alertness: Awake/alert Behavior During Therapy: WFL for tasks assessed/performed Overall Cognitive Status: Within Functional Limits for tasks assessed                                        Exercises      General Comments        Pertinent Vitals/Pain Pain Assessment: 0-10 Pain Score: 6  Pain Location: chest Pain Descriptors / Indicators: Operative site guarding;Discomfort Pain Intervention(s): Monitored during session    Home Living                      Prior Function            PT Goals (current goals can now be found in the care plan section) Acute Rehab PT Goals Patient Stated Goal: go home PT Goal Formulation: With patient Time For Goal Achievement: 05/22/18 Potential to Achieve Goals: Good Progress towards PT goals: Progressing toward goals    Frequency    Min 3X/week      PT Plan Current plan remains appropriate    Co-evaluation              AM-PAC PT "6 Clicks" Mobility   Outcome Measure  Help needed turning from your back to your side while in a flat bed without using bedrails?:  None Help needed moving from lying on your back to sitting on the side of a flat bed without using bedrails?: None Help needed moving to and from a bed to a chair (including a wheelchair)?: None Help needed standing up from a chair using your arms (e.g., wheelchair or bedside chair)?: A Little Help needed to walk in hospital room?: A Little Help needed climbing 3-5 steps with a railing? : A Little 6 Click Score: 21    End of Session Equipment Utilized During Treatment: Gait belt Activity Tolerance: Patient tolerated treatment well Patient left: in chair;with call bell/phone within reach;with  family/visitor present Nurse Communication: Mobility status PT Visit Diagnosis: Other abnormalities of gait and mobility (R26.89);Muscle weakness (generalized) (M62.81)     Time: 1157-2620 PT Time Calculation (min) (ACUTE ONLY): 15 min  Charges:  $Self Care/Home Management: 8-22                     Charlotte Crumb, PT DPT  Board Certified Neurologic Specialist Acute Rehabilitation Services Pager [phone] Office [phone]    Fabio Asa 05/09/2018, 11:41 AM

## 2018-05-09 NOTE — Progress Notes (Signed)
CARDIAC REHAB PHASE I   Pt just ambulated in hallway with PT, states he feels much better than yesterday. D/c education completed with pt and wife. Pt instructed on importance of showering and monitoring incisions. Encouraged continued IS use, walks, and sternal precautions. Pt given in-the-tube sheet, and heart healthy diet. Reviewed restrictions and exercise guidelines. Will refer to CRP II [place].   [id number] Reynold Bowen, RN BSN 05/09/2018 11:08 AM

## 2018-05-11 ENCOUNTER — Telehealth: Payer: Self-pay

## 2018-05-11 ENCOUNTER — Telehealth (HOSPITAL_COMMUNITY): Payer: Self-pay

## 2018-05-11 NOTE — Telephone Encounter (Signed)
Pt insurance is active and benefits verified through [employer] Co-pay 0, DED $2,500/$2,500 met, out of pocket $6,350/$3,967.77 met, co-insurance 40%. no pre-authorization required, REF# [id number]  Will contact patient to see if he is interested in the Cardiac Rehab Program. If interested, patient will need to complete follow up appt. Once completed, patient will be contacted for scheduling upon review by the RN Navigator. Tedra Senegal. Support Rep II

## 2018-05-11 NOTE — Telephone Encounter (Signed)
Patient's wife, Crystal contacted the office concerned about her husband and possible depression.  Patient is s/p CABG x5 05/04/2018 with Dr. Dorris Fetch.  I advised that some depression is normal after major surgery, however if she felt he needed to be evaluated she should contact his PCP.  She acknowledged receipt.

## 2018-05-15 ENCOUNTER — Other Ambulatory Visit: Payer: Self-pay | Admitting: Physician Assistant

## 2018-05-15 MED ORDER — OXYCODONE HCL 5 MG PO TABS: 5.0000 mg | ORAL_TABLET | Freq: Three times a day (TID) | ORAL | 0 refills | Status: DC | PRN

## 2018-05-15 NOTE — Progress Notes (Signed)
Post Discharge Note- 05/15/18- received call from [facility]- Danella Deis) per conversation they have been unable to secure a HH agency to provide HHPT to pt. At this time they are closing the request as they have exhausted all effects in network and out of network providers with no one accepting referral. Call made to pt's home- spoke with wife Crystal- explained to her situation she states that insurance has tried to reach them but she has been unable to return call. Per wife pt is doing well, discussed option of outpt PT- however wife does not feel pt needs PT at this time.

## 2018-05-15 NOTE — Progress Notes (Signed)
Patient contacted office requesting refill on pain medication.  I will provide a refill of Oxycodone 5 mg every 8 hours as needed for pain.  Disp #20 with no refills.  He was instructed via nursing staff this will be his final refill and he will need to use Tylenol prn for pain.   Lowella Dandy, PA-C

## 2018-05-18 NOTE — Progress Notes (Deleted)
Cardiology Office Note   Date:  05/18/2018   ID:  David ButcherMatthew D Christian, DOB 1975-11-01, MRN [id number]  PCP:  David DoneSlatosky, David J., MD  Cardiologist: Dr. Royann Christian   No chief complaint on file.    History of Present Illness: David Christian is a 43 y.o. male who presents for post hospital follow up s/p CABGx5, seen for   David Christian a prior hx of a hypertension, dyslipidemia, obstructive sleep apnea, obesity, remote tobacco smoker until about 18years ago who is been having exertional chest pressure advancing to chest pain associated with shortness of breath for about 3 years. He sought medical attention regarding the symptoms on several occasions and received treatment aimed at managing suspected asthma and gastroesophageal reflux disease. After his symptoms persisted he sought medical attention from a cardiologist in recent weeks and underwent a Lexiscan Myoview on 04/28/2018 that demonstrated a large area of reversible ischemia in the distribution of the LAD. Left heart catheterization was recommended and he presented today for the procedure on an elective basis. Left heart catheterization showed an ostial to distal left main stenosis of 75%, aproximal to mid LAD stenosis of 99%, and an ostial right coronary artery stenosis of 50% with multiple distal lesions occluding an 80% stenosis of the right posterior lateral branch of the RCA. His ejection fraction was estimated at 45 to 50%. It was felt coronary bypass grafting would be indicated, the patient was admitted and Cardiothoracic consult was obtained. CABGx5 performed on 05/04/2018. He did well in the surgical and post-surgical setting and was discharged on 05/09/2018.   Discharge medications included ASA 81(325) ?, atorvastatin 80, ,metoprolol tartrate 25,   Follow up with [facility] on 06/06/2018 at 1045 with CX[facility]  1. CAD: David Christian describes a very typical pattern of stable angina pectoris.  I suspect  that his exertional dyspnea is also a consequence of coronary artery disease.  He seems to have extensive ischemia in the territory of the LAD artery.  He should undergo cardiac catheterization with coronary angiography and if necessary percutaneous plasty/stent placement.  We discussed the pros and cons and potential complications of both diagnostic will go ahead with it.  He is scheduled on Wednesday with David Christian, who had a brief opportunity to meet with him in the clinic today.  If he develops symptoms at rest that lasts for more than 20-30 minutes in the interim, he should go to the emergency room.  He has already started taking aspirin 81 mg daily several months ago.  We will start a low-dose beta-blocker today. 2. HLP: We will retrieve his most recent lipid profile.  Discussed the fact that he treated to a target LDL cholesterol of no more than 78 he is already on a potent statin and on ezetimibe.  We discussed healthy changes to his diet exercise and the importance of weight loss in the long-term. 3. HTN: Well treated on the current medications.  Adding a beta-blocker.  May have to curtail use of the ACE inhibitor or diuretic in the future.  He should not take the lisinopril-hydrochlorothiazide on the day of his cardiac catheterization. 4. OSA: Reports 100% compliance with therapy and good results symptomatically with CPAP. 5. Obesity: Once his coronary status is clarified and treated, routine physical exercise (in addition to a diet that is calorie restricted and low in carbohydrates with low glycemic index and low in saturated fats) will be important to achieve significant weight loss.  Past Medical History:  Diagnosis Date  . Anginal pain (HCC)   . Coronary artery disease   . Hyperlipidemia   . Hypertension   . Sleep apnea    uses CPAP    Past Surgical History:  Procedure Laterality Date  . CORONARY ARTERY BYPASS GRAFT N/A 05/04/2018   Procedure: CORONARY ARTERY BYPASS GRAFTING  (CABG) x5 using right greater saphenous vein harvested endoscopically and bilateral left internal mammary arteries;  Surgeon: David Slot, MD;  Location: [facility] OR;  Service: Open Heart Surgery;  Laterality: N/A;  possible bilateral IMA  . ELBOW SURGERY    . HAND SURGERY    . LEFT HEART CATH AND CORONARY ANGIOGRAPHY N/A 05/03/2018   Procedure: LEFT HEART CATH AND CORONARY ANGIOGRAPHY;  Surgeon: David Lex, MD;  Location: [facility] INVASIVE CV LAB;  Service: Cardiovascular;  Laterality: N/A;  . MASS EXCISION Right 10/31/2014   Procedure: EXCISION ANTERIOR NECK & RIGHT SUBMANDIBULAR CYSTS WITH COMPLEX CLOSURES;  Surgeon: David Murders, MD;  Location: [facility];  Service: ENT;  Laterality: Right;  CPAP  . TEE WITHOUT CARDIOVERSION N/A 05/04/2018   Procedure: TRANSESOPHAGEAL ECHOCARDIOGRAM (TEE);  Surgeon: David Slot, MD;  Location: [facility];  Service: Open Heart Surgery;  Laterality: N/A;     Current Outpatient Medications  Medication Sig Dispense Refill  . acetaminophen (TYLENOL) 325 MG tablet Take 2 tablets (650 mg total) by mouth every 6 (six) hours as needed for mild pain.    [name] aspirin EC 325 MG EC tablet Take 1 tablet (325 mg total) by mouth daily.    [name] atorvastatin (LIPITOR) 80 MG tablet Take 1 tablet (80 mg total) by mouth every evening. 30 tablet 1  . metoprolol tartrate (LOPRESSOR) 25 MG tablet Take 1 tablet (25 mg total) by mouth 2 (two) times daily. 60 tablet 1  . oxyCODONE (OXY IR/ROXICODONE) 5 MG immediate release tablet Take 1 tablet (5 mg total) by mouth every 8 (eight) hours as needed for severe pain. 20 tablet 0   No current facility-administered medications for this visit.     Allergies:   Patient has no known allergies.    Social History:  The patient  reports that he quit smoking about 18 years ago. He quit after 6.00 years of use. His smokeless tobacco use includes chew. He reports current alcohol use of about 1.0 standard drinks of alcohol per week. He  reports that he does not use drugs.   Family History:  The patient's family history includes Arthritis in his maternal grandfather and paternal grandmother.    ROS:  Please see the history of present illness. Otherwise, review of systems are positive for {NONE DEFAULTED:18576::"none"}. All other systems are reviewed and negative.    PHYSICAL EXAM: VS:  There were no vitals taken for this visit. , BMI There is no height or weight on file to calculate BMI.    General: Well developed, well nourished, NAD Skin: Warm, dry, intact  Head: Normocephalic, atraumatic, sclera non-icteric, no xanthomas, clear, moist mucus membranes. Neck: Negative for carotid bruits. No JVD Lungs:Clear to ausculation bilaterally. No wheezes, rales, or rhonchi. Breathing is unlabored. Cardiovascular: RRR with S1 S2. No murmurs, rubs, gallops, or LV heave appreciated. Abdomen: Soft, non-tender, non-distended with normoactive bowel sounds. No hepatomegaly, No rebound/guarding. No obvious abdominal masses. MSK: Strength and tone appear normal for age. 5/5 in all extremities Extremities: No edema. No clubbing or cyanosis. DP/PT pulses 2+ bilaterally Neuro: Alert and oriented. No focal deficits. No facial asymmetry.  MAE spontaneously. Psych: Responds to questions appropriately with normal affect.        EKG:  EKG {ACTION; IS/IS ZOX:09604540} ordered today. The ekg ordered today demonstrates ***   Recent Labs: 05/05/2018: Magnesium 2.1 05/07/2018: BUN 15; Creatinine, Ser 0.98; Hemoglobin 9.9; Platelets 167; Potassium 3.9; Sodium 136    Lipid Panel    Component Value Date/Time   CHOL 270 (H) 03/04/2015 0825   TRIG 217.0 (H) 03/04/2015 0825   HDL 40.80 03/04/2015 0825   CHOLHDL 7 03/04/2015 0825   VLDL 43.4 (H) 03/04/2015 0825   LDLCALC 205 (H) 05/23/2013 1416   LDLDIRECT 182.0 03/04/2015 0825      Wt Readings from Last 3 Encounters:  05/09/18 274 lb 9.6 oz (124.6 kg)  04/28/18 254 lb (115.2 kg)   04/28/18 254 lb (115.2 kg)      Other studies Reviewed: Additional studies/ records that were reviewed today include:   Cath   Ost LM to Dist LM lesion is 75% stenosed.  Prox LAD to Mid LAD lesion is 99% stenosed. Chronic subtotal occlusion with bridging, left left and right left collaterals  Ost RCA lesion is 50% stenosed. Prox RCA lesion is 50% stenosed. Mid RCA lesion is 55% stenosed. Post Atrio lesion is 80% stenosed.  The left ventricular ejection fraction is 45-50% by visual estimate. Cannot exclude regional wall motion normality.  LV end diastolic pressure is normal.  SUMMARY  Severe multivessel CAD: 75% Left Main, subtotal 99-100% CTO of LAD after 1stDiag, diffuse moderate 50 to 60% disease in the ostial, proximal and mid RCA with 80% RPL 1  Borderline low LVEF of 50% on Myoview with no obvious wall motion normality (however not adequately imaged with LV gram).  Borderline elevated LVEDP.  Echo 05/03/2018:  FINDINGS  Left Ventricle: The left ventricle has hyperdynamic systolic function, with an ejection fraction of >65%. The cavity size was normal. There is no increase in left ventricular wall thickness. Left ventricular diastolic Doppler parameters are consistent  with impaired relaxation Normal left ventricular filling pressures Right Ventricle: The right ventricle has normal systolic function. The cavity was normal. There is no increase in right ventricular wall thickness. Left Atrium: left atrial size was normal in size Right Atrium: right atrial size was normal in size. Interatrial Septum: No atrial level shunt detected by color flow Doppler. Pericardium: There is no evidence of pericardial effusion. Mitral Valve: The mitral valve is normal in structure. Mitral valve regurgitation is not visualized by color flow Doppler. Tricuspid Valve: The tricuspid valve is normal in structure. Tricuspid valve regurgitation was not visualized by color flow Doppler. Aortic  Valve: The aortic valve is tricuspid Mild thickening of the aortic valve Mild calcification of the aortic valve. Aortic valve regurgitation was not visualized by color flow Doppler. Pulmonic Valve: The pulmonic valve was normal in structure. Pulmonic valve regurgitation is not visualized by color flow Doppler. Venous: The inferior vena cava was not well visualized. The inferior vena cava is normal in size with greater than 50% respiratory variability.   ASSESSMENT AND PLAN:  1.  ***   Current medicines are reviewed at length with the patient today.  The patient {ACTIONS; HAS/DOES NOT HAVE:19233} concerns regarding medicines.  The following changes have been made:  {PLAN; NO CHANGE:13088:s}  Labs/ tests ordered today include: *** No orders of the defined types were placed in this encounter.    Disposition:   FU with *** in {gen number 9-81:191478} {Days to years:10300}  Signed, Georgie Chard, NP  05/18/2018 11:25 AM    [facility] [address] [place], [place], [place]  [address] Phone: [phone]; Fax: [phone]

## 2018-05-19 ENCOUNTER — Other Ambulatory Visit: Payer: Self-pay

## 2018-05-19 ENCOUNTER — Ambulatory Visit: Payer: Managed Care, Other (non HMO) | Admitting: Physician Assistant

## 2018-05-19 ENCOUNTER — Ambulatory Visit: Payer: Managed Care, Other (non HMO) | Admitting: Cardiology

## 2018-05-19 ENCOUNTER — Ambulatory Visit: Payer: PRIVATE HEALTH INSURANCE | Admitting: Cardiology

## 2018-05-19 ENCOUNTER — Encounter: Payer: Self-pay | Admitting: Cardiology

## 2018-05-19 VITALS — BP 112/70 | HR 70 | Ht 71.0 in | Wt 264.2 lb

## 2018-05-19 DIAGNOSIS — E782 Mixed hyperlipidemia: Secondary | ICD-10-CM

## 2018-05-19 DIAGNOSIS — I10 Essential (primary) hypertension: Secondary | ICD-10-CM

## 2018-05-19 DIAGNOSIS — I6521 Occlusion and stenosis of right carotid artery: Secondary | ICD-10-CM

## 2018-05-19 DIAGNOSIS — I25118 Atherosclerotic heart disease of native coronary artery with other forms of angina pectoris: Secondary | ICD-10-CM

## 2018-05-19 DIAGNOSIS — I2510 Atherosclerotic heart disease of native coronary artery without angina pectoris: Secondary | ICD-10-CM | POA: Diagnosis not present

## 2018-05-19 DIAGNOSIS — E669 Obesity, unspecified: Secondary | ICD-10-CM

## 2018-05-19 NOTE — Progress Notes (Signed)
Cardiology Office Note   Date:  05/18/2018   ID:  [name], DOB 1975-11-15, MRN [id number]  PCP:  Nonnie Done., MD  Cardiologist: Dr. Royann Shivers   CC: Post-Open heart surgery   History of Present Illness: David Christian is a 43 y.o. male who presents for post hospital follow up s/p CABGx5, seen for Dr. Royann Shivers.   David Christian a prior hx of a hypertension, dyslipidemia, obstructive sleep apnea, obesity, remote tobacco smoker until about 18years ago who is been having exertional chest pressure advancing to chest pain associated with shortness of breath for about 3 years. He sought medical attention regarding the symptoms on several occasions and received treatment aimed at managing suspected asthma and gastroesophageal reflux disease. After his symptoms persisted he sought medical attention from a cardiologist in recent weeks and underwent a Lexiscan Myoview on 04/28/2018 that demonstrated a large area of reversible ischemia in the distribution of the LAD. Left heart catheterization was recommended and he presented today for the procedure on an elective basis. Left heart catheterization showed an ostial to distal left main stenosis of 75%, aproximal to mid LAD stenosis of 99%, and an ostial right coronary artery stenosis of 50% with multiple distal lesions occluding an 80% stenosis of the right posterior lateral branch of the RCA. His ejection fraction was estimated at 45 to 50%. It was felt coronary bypass grafting would be indicated and a cardiothoracic consult was obtained. CABGx5 performed on 05/04/2018. He did well in the surgical and post-surgical setting and was discharged on 05/09/2018.   Discharge medications included ASA 325, atorvastatin 80 and metoprolol tartrate 25>>reports compliance   Follow up with [facility] on 06/06/2018 at 1045 with CXR prior to that appointment at [facility] imaging at 1015. He has been seen by his PCP in the post-operative setting as well.   Today  he states that he is feeling well but mildly fatigued as to be expected. He reports that he is increasing his activity slowly. He has been walking with increased distances without complication. He denies SOB, chest pain, LE swelling, PND or orthopnea symptoms. He reports no drainage from surgical sites. He verbalizes understanding of sternal precautions. He states that his surgical site pain is minimal and is well controlled with Tylenol at this point. He has no other complaints.    Past Medical History:  Diagnosis Date  . Anginal pain (HCC)   . Coronary artery disease   . Hyperlipidemia   . Hypertension   . Sleep apnea    uses CPAP   Past Surgical History:  Procedure Laterality Date  . CORONARY ARTERY BYPASS GRAFT N/A 05/04/2018   Procedure: CORONARY ARTERY BYPASS GRAFTING (CABG) x5 using right greater saphenous vein harvested endoscopically and bilateral left internal mammary arteries;  Surgeon: [name], MD;  Location: [facility] OR;  Service: Open Heart Surgery;  Laterality: N/A;  possible bilateral IMA  . ELBOW SURGERY    . HAND SURGERY    . LEFT HEART CATH AND CORONARY ANGIOGRAPHY N/A 05/03/2018   Procedure: LEFT HEART CATH AND CORONARY ANGIOGRAPHY;  Surgeon: [name], MD;  Location: [facility] INVASIVE CV LAB;  Service: Cardiovascular;  Laterality: N/A;  . MASS EXCISION Right 10/31/2014   Procedure: EXCISION ANTERIOR NECK & RIGHT SUBMANDIBULAR CYSTS WITH COMPLEX CLOSURES;  Surgeon: [name], MD;  Location: [facility];  Service: ENT;  Laterality: Right;  CPAP  . TEE WITHOUT CARDIOVERSION N/A 05/04/2018   Procedure: TRANSESOPHAGEAL ECHOCARDIOGRAM (TEE);  Surgeon:  [name], MD;  Location: [facility] OR;  Service: Open Heart Surgery;  Laterality: N/A;    Current Outpatient Medications  Medication Sig Dispense Refill  . acetaminophen (TYLENOL) 325 MG tablet Take 2 tablets (650 mg total) by mouth every 6 (six) hours as needed for mild pain.    [name] aspirin EC 325 MG EC  tablet Take 1 tablet (325 mg total) by mouth daily.    [name] atorvastatin (LIPITOR) 80 MG tablet Take 1 tablet (80 mg total) by mouth every evening. 30 tablet 1  . metoprolol tartrate (LOPRESSOR) 25 MG tablet Take 1 tablet (25 mg total) by mouth 2 (two) times daily. 60 tablet 1  . oxyCODONE (OXY IR/ROXICODONE) 5 MG immediate release tablet Take 1 tablet (5 mg total) by mouth every 8 (eight) hours as needed for severe pain. 20 tablet 0   No current facility-administered medications for this visit.    Allergies:   Patient has no known allergies.   Social History:  The patient  reports that he quit smoking about 18 years ago. He quit after 6.00 years of use. His smokeless tobacco use includes chew. He reports current alcohol use of about 1.0 standard drinks of alcohol per week. He reports that he does not use drugs.   Family History:  The patient's family history includes Arthritis in his maternal grandfather and paternal grandmother.    ROS:  Please see the history of present illness. Otherwise, review of systems are positive for none. All other systems are reviewed and negative.    PHYSICAL EXAM: VS: BP: 112/70, HR: 70, Weight: 264lb, O2: 98%, BMI: 36.85    General: Overweight, NAD Skin: Warm, dry, intact  Head: Normocephalic, atraumatic, clear, moist mucus membranes. Neck: Negative for carotid bruits. No JVD Lungs:Clear to ausculation bilaterally. No wheezes, rales, or rhonchi. Breathing is unlabored. Cardiovascular: RRR with S1 S2. No murmurs, rubs, gallops, or LV heave appreciated. Abdomen: Soft, non-tender, non-distended with normoactive bowel sounds. No obvious abdominal masses. MSK: Strength and tone appear normal for age. 5/5 in all extremities Extremities: No edema. No clubbing or cyanosis. DP/PT pulses 2+ bilaterally Neuro: Alert and oriented. No focal deficits. No facial asymmetry. MAE spontaneously. Psych: Responds to questions appropriately with normal affect.     EKG:  EKG is  ordered today. The ekg ordered today demonstrates NSR   Recent Labs: 05/05/2018: Magnesium 2.1 05/07/2018: BUN 15; Creatinine, Ser 0.98; Hemoglobin 9.9; Platelets 167; Potassium 3.9; Sodium 136    Lipid Panel    Component Value Date/Time   CHOL 270 (H) 03/04/2015 0825   TRIG 217.0 (H) 03/04/2015 0825   HDL 40.80 03/04/2015 0825   CHOLHDL 7 03/04/2015 0825   VLDL 43.4 (H) 03/04/2015 0825   LDLCALC 205 (H) 05/23/2013 1416   LDLDIRECT 182.0 03/04/2015 0825    Wt Readings from Last 3 Encounters:  05/09/18 274 lb 9.6 oz (124.6 kg)  04/28/18 254 lb (115.2 kg)  04/28/18 254 lb (115.2 kg)     Other studies Reviewed: Additional studies/ records that were reviewed today include:   Cath 05/03/2018:  Ost LM to Dist LM lesion is 75% stenosed.  Prox LAD to Mid LAD lesion is 99% stenosed. Chronic subtotal occlusion with bridging, left left and right left collaterals  Ost RCA lesion is 50% stenosed. Prox RCA lesion is 50% stenosed. Mid RCA lesion is 55% stenosed. Post Atrio lesion is 80% stenosed.  The left ventricular ejection fraction is 45-50% by visual estimate. Cannot exclude regional wall motion normality.  LV end diastolic pressure is normal.  SUMMARY  Severe multivessel CAD: 75% Left Main, subtotal 99-100% CTO of LAD after 1stDiag, diffuse moderate 50 to 60% disease in the ostial, proximal and mid RCA with 80% RPL 1  Borderline low LVEF of 50% on Myoview with no obvious wall motion normality (however not adequately imaged with LV gram).  Borderline elevated LVEDP.  Echo 05/03/2018:  FINDINGS  Left Ventricle: The left ventricle has hyperdynamic systolic function, with an ejection fraction of >65%. The cavity size was normal. There is no increase in left ventricular wall thickness. Left ventricular diastolic Doppler parameters are consistent  with impaired relaxation Normal left ventricular filling pressures Right Ventricle: The right ventricle has normal systolic function.  The cavity was normal. There is no increase in right ventricular wall thickness. Left Atrium: left atrial size was normal in size Right Atrium: right atrial size was normal in size. Interatrial Septum: No atrial level shunt detected by color flow Doppler. Pericardium: There is no evidence of pericardial effusion. Mitral Valve: The mitral valve is normal in structure. Mitral valve regurgitation is not visualized by color flow Doppler. Tricuspid Valve: The tricuspid valve is normal in structure. Tricuspid valve regurgitation was not visualized by color flow Doppler. Aortic Valve: The aortic valve is tricuspid Mild thickening of the aortic valve Mild calcification of the aortic valve. Aortic valve regurgitation was not visualized by color flow Doppler. Pulmonic Valve: The pulmonic valve was normal in structure. Pulmonic valve regurgitation is not visualized by color flow Doppler. Venous: The inferior vena cava was not well visualized. The inferior vena cava is normal in size with greater than 50% respiratory variability.  ASSESSMENT AND PLAN:  1. CAD s/p CABG 04/2018: -Denies recurrent chest pain -Slowly increasing activity without complication  -Sternal and right leg sites free of redness or drainage -Sternal precautions reviewed with patient understanding -Continue high dose ASA 325 for 3 months, continue statin, BB  2. HTN: -Stable, 112/70 -Continue current regimen   3. Obesity: -BMI, 36.85 -Encouraged physical activity with walking for at least 15-30 minutes per day, 4 days per week   4. HLD: -Last LDL at PCP 08/2017, 127 -Continue high intensity statin and re-check Lipid panel at next office visit or with PCP    Current medicines are reviewed at length with the patient today.  The patient does not have concerns regarding medicines.  The following changes have been made: no change   Labs/ tests ordered today include: None  No orders of the defined types were placed in this  encounter.   Disposition: FU with Dr. Royann Shivers in 3 months    Signed, Georgie Chard, NP  05/18/2018 11:25 AM    [facility] [address] [place], [place], [place]  [address] Phone: [phone]; Fax: [phone]

## 2018-05-19 NOTE — Patient Instructions (Addendum)
Medication Instructions:  Your physician recommends that you continue on your current medications as directed. Please refer to the Current Medication list given to you today.  If you need a refill on your cardiac medications before your next appointment, please call your pharmacy.   Lab work: NONE  If you have labs (blood work) drawn today and your tests are completely normal, you will receive your results only by: [name] MyChart Message (if you have MyChart) OR . A paper copy in the mail If you have any lab test that is abnormal or we need to change your treatment, we will call you to review the results.  Testing/Procedures: NONE  Follow-Up: At [facility], you and your health needs are our priority.  As part of our continuing mission to provide you with exceptional heart care, we have created designated Provider Care Teams.  These Care Teams include your primary Cardiologist (physician) and Advanced Practice Providers (APPs -  Physician Assistants and Nurse Practitioners) who all work together to provide you with the care you need, when you need it. You will need a follow up appointment in 3 months.  Please call our office 2 months in advance to schedule this appointment.  You may see [name], MD or one of the following Advanced Practice Providers on your designated Care Team: [name], [place] . [name], PA-C  Any Other Special Instructions Will Be Listed Below (If Applicable).

## 2018-05-22 ENCOUNTER — Telehealth: Payer: Self-pay | Admitting: Vascular Surgery

## 2018-05-22 NOTE — Telephone Encounter (Signed)
sch appt spk to pt wife mld ltr 05/26/2018 340pm CTA neck 05/30/2018 330pm p/o MD

## 2018-05-22 NOTE — Telephone Encounter (Signed)
-----   Message from [name], MD sent at 05/09/2018  7:15 AM EST ----- Mr. Camino will be discharged probably today following his coronary artery bypass grafting.  I need to see him in the office in 2 to 3 weeks.  He needs an outpatient CT angiogram of his neck prior to seeing me.

## 2018-05-26 ENCOUNTER — Ambulatory Visit
Admission: RE | Admit: 2018-05-26 | Discharge: 2018-05-26 | Disposition: A | Discharge status: Home / Self Care | Payer: Managed Care, Other (non HMO) | Source: Ambulatory Visit | Attending: Vascular Surgery | Admitting: Vascular Surgery

## 2018-05-26 ENCOUNTER — Other Ambulatory Visit: Payer: Self-pay

## 2018-05-26 DIAGNOSIS — I6521 Occlusion and stenosis of right carotid artery: Secondary | ICD-10-CM

## 2018-05-26 MED ORDER — IOPAMIDOL (ISOVUE-370) INJECTION 76%
75.0000 mL | Freq: Once | INTRAVENOUS | Status: AC | PRN
  Administered 2018-05-26: 75 mL via INTRAVENOUS

## 2018-05-26 NOTE — Telephone Encounter (Signed)
Called and spoke with pt's wife in regards to CR, adv we have recv'd the pt referral. And at this time we are not scheduling due to the COVID-19. Once we have resume scheduling we will contact the pt. She verbalized understanding. Tempie Donning. Support Rep II

## 2018-05-30 ENCOUNTER — Encounter: Payer: Managed Care, Other (non HMO) | Admitting: Vascular Surgery

## 2018-06-02 ENCOUNTER — Telehealth: Payer: Self-pay | Admitting: Physician Assistant

## 2018-06-05 ENCOUNTER — Other Ambulatory Visit: Payer: Self-pay | Admitting: Thoracic Surgery (Cardiothoracic Vascular Surgery)

## 2018-06-05 DIAGNOSIS — Z951 Presence of aortocoronary bypass graft: Secondary | ICD-10-CM

## 2018-06-05 NOTE — Telephone Encounter (Signed)
      [address]       [place] [address]             [phone]    David Christian [id number]   S/P CABG x 5 utilizing RIMA performed on 05/03/2018. Discharged home on 05/09/2018.  Medications:   Current Outpatient Medications:  .  acetaminophen (TYLENOL) 325 MG tablet, Take 2 tablets (650 mg total) by mouth every 6 (six) hours as needed for mild pain., Disp: , Rfl:  .  aspirin EC 325 MG EC tablet, Take 1 tablet (325 mg total) by mouth daily., Disp: , Rfl:  .  atorvastatin (LIPITOR) 80 MG tablet, Take 1 tablet (80 mg total) by mouth every evening., Disp: 30 tablet, Rfl: 1 .  ibuprofen (ADVIL,MOTRIN) 200 MG tablet, Take 200 mg by mouth every 6 (six) hours as needed., Disp: , Rfl:  .  metoprolol tartrate (LOPRESSOR) 25 MG tablet, Take 1 tablet (25 mg total) by mouth 2 (two) times daily., Disp: 60 tablet, Rfl: 1 .  sertraline (ZOLOFT) 50 MG tablet, , Disp: , Rfl:   Problems/Concerns:  The patient does have 2 hard knots in the his leg.  He states these are hard and are deep in his leg.  There is no reddness.   Assessment:  Patient is doing well.  He is ambulating without difficulty on flat ground.  He can walk 30-45 minutes, but does get short of breath.  He also gets short of breath with ambulating uphill.  He states that he feels like a sandbag is on his chest when he lays flat.  he states his incisions are well healed.  There is no evidence of infection.  He does have a small stitch coming out of the middle of his incision.    Plan: Patient could have a pleural effusion.  He states that he struggles to lay flat and keep his breath.  He describes the sensation as a sandbag laying on his chest.  The patient would ideally benefit from a CXR.  Will speak with nursing staff who would contact patient and set up follow up in our office with a CXR.  Follow up Appointment: Office will arrange in a few weeks   Lowella Dandy, PA-C

## 2018-06-06 ENCOUNTER — Ambulatory Visit (INDEPENDENT_AMBULATORY_CARE_PROVIDER_SITE_OTHER): Payer: Self-pay | Admitting: Thoracic Surgery (Cardiothoracic Vascular Surgery)

## 2018-06-06 ENCOUNTER — Telehealth: Payer: Self-pay | Admitting: Vascular Surgery

## 2018-06-06 ENCOUNTER — Other Ambulatory Visit: Payer: Self-pay

## 2018-06-06 ENCOUNTER — Ambulatory Visit
Admission: RE | Admit: 2018-06-06 | Discharge: 2018-06-06 | Disposition: A | Discharge status: Home / Self Care | Payer: Managed Care, Other (non HMO) | Source: Ambulatory Visit | Attending: Thoracic Surgery (Cardiothoracic Vascular Surgery) | Admitting: Thoracic Surgery (Cardiothoracic Vascular Surgery)

## 2018-06-06 VITALS — BP 85/55 | HR 67 | Temp 97.7°F | Resp 20 | Ht 71.0 in | Wt 261.0 lb

## 2018-06-06 DIAGNOSIS — Z951 Presence of aortocoronary bypass graft: Secondary | ICD-10-CM

## 2018-06-06 DIAGNOSIS — I2510 Atherosclerotic heart disease of native coronary artery without angina pectoris: Secondary | ICD-10-CM

## 2018-06-06 NOTE — Telephone Encounter (Signed)
I spoke with the patient and his wife by telephone regarding his CT scan of his neck on 05/26/2018.  This does show high-grade right carotid stenosis normal internal carotid distally.  I have recommended endarterectomy for reduction of stroke risk.  I again discussed symptoms of carotid disease to him.  He has remained asymptomatic.  Will defer scheduling surgery until after the corona virus issue is resolved.  I will see him in the office in several weeks for follow-up and surgical scheduling

## 2018-06-06 NOTE — Progress Notes (Signed)
[address]       [place] [address]             [phone]    HPI: David Christian returns for scheduled postoperative follow-up visit  David Christian is a 43 year old man with a history of hypertension, hyperlipidemia, obstructive sleep apnea, and obesity, who presented with exertional chest pain and shortness of breath.  He had a positive Lexiscan Myoview.  At catheterization he had a 75% left main stenosis and severe three-vessel disease.  He underwent coronary artery bypass grafting x5 with bilateral mammary arteries on 05/04/2018.  He went home on postoperative day #5.  He was taking narcotics initially but now is only taking Tylenol at night before he goes to bed.  He says he feels a tightness around the incision when he takes a deep breath.  That occurs whether he is exerting himself or not.  He has not had any recurrent anginal pain.  He has noticed 2 "knots" in his right leg.  Past Medical History:  Diagnosis Date  . Anginal pain (HCC)   . Coronary artery disease   . Hyperlipidemia   . Hypertension   . Sleep apnea    uses CPAP    Current Outpatient Medications  Medication Sig Dispense Refill  . acetaminophen (TYLENOL) 325 MG tablet Take 2 tablets (650 mg total) by mouth every 6 (six) hours as needed for mild pain.    [name] aspirin EC 325 MG EC tablet Take 1 tablet (325 mg total) by mouth daily.    [name] atorvastatin (LIPITOR) 80 MG tablet Take 1 tablet (80 mg total) by mouth every evening. 30 tablet 1  . metoprolol tartrate (LOPRESSOR) 25 MG tablet Take 1 tablet (25 mg total) by mouth 2 (two) times daily. 60 tablet 1  . sertraline (ZOLOFT) 50 MG tablet      No current facility-administered medications for this visit.     Physical Exam BP (!) 85/55   Pulse 67   Temp 97.7 F (36.5 C) (Oral)   Resp 20   Ht 5\' 11"  (1.803 m)   Wt 261 lb (118.4 kg)   SpO2 97% Comment: RA  BMI 36.55 kg/m  43 year old man in no acute distress Alert and oriented x3 with no focal  deficits Lungs clear with equal breath sounds bilaterally Cardiac regular rate and rhythm normal S1 and S2 Sternum stable, incision healing well Leg incisions healing well, 2 small firm nodular areas along saphenous vein harvest site approximately 1 cm in diameter consistent with small hematomas.  No erythema or induration or tenderness.  Diagnostic Tests: CHEST - 2 VIEW  COMPARISON:  05/07/2018  FINDINGS: Status post median sternotomy. Mild cardiomegaly. Small persistent left pleural effusion, as seen on prior examination. Both lungs are clear. The visualized skeletal structures are unremarkable.  IMPRESSION: Mild cardiomegaly status post median sternotomy. Small persistent left pleural effusion. No acute appearing airspace opacity.   Electronically Signed   By: Lauralyn Primes M.D.   On: 06/06/2018 10:56   Result History   DG Chest 2 View (Order #[id number]) on 06/06/2018 - Order Result History Report    I personally reviewed the chest x-ray images and concur with the findings noted above.  Impression: David Christian is a 43 year old man with multiple cardiac risk factors who presented with progressive exertional angina.  He was found to have left main and three-vessel disease.  He underwent coronary artery bypass grafting x5 on 05/04/40.  His postoperative course  was unremarkable and he went home on day 5.  He is making good progress.  His exercise tolerance is excellent.  He is walking 25 to 30 minutes at a time.  He has not had any recurrent angina.  He does have some tightness in the sternal incision when he takes a deep breath.  That is a normal sensation and will improve with time.  He has a tiny left pleural effusion.  No intervention is warranted.  He likely has 2 small hematomas along the saphenous vein harvest tract.  There is no evidence of inflammation or infection.  He will call if he develops any pain, tenderness, swelling, or redness.  I recommended that he  wait at least a month before trying to go back to work.  It may be longer than that before he is ready so if there are any questions he will contact our office.  He may drive.  Should not lift anything over 10 pounds for another 2 weeks.  Plan: Follow-up with Dr. Tawanna Cooler early regarding carotid stenosis Follow-up with Dr. Royann Shivers I will be happy to see David Christian back at anytime if I can be of any further assistance with his care.  [name], MD [facility] [phone]

## 2018-06-12 ENCOUNTER — Telehealth: Payer: Self-pay | Admitting: Cardiovascular Disease

## 2018-06-12 ENCOUNTER — Telehealth: Payer: Self-pay | Admitting: Vascular Surgery

## 2018-06-12 NOTE — Telephone Encounter (Signed)
-----   Message from [name], MD sent at 06/06/2018  1:32 PM EDT -----  Needs office visit with me in about 3 weeks to discuss planning right carotid endarterectomy.  Had CT scanning on 05/26/2018 so therefore does not need noninvasive studies

## 2018-06-12 NOTE — Telephone Encounter (Signed)
I contact patient, and spoke with wife. She states that since the patients surgery- they feel it is necessary for patient to still be seen. I advised with patient that during this time, we are limiting the amount of patients that are coming into the office due to COVID 19 unless it is an emergency case, patient is not having any issues at this time, but advised with them I would send a message to Dr.C to advise if patient should be seen, okay to do a virtual visit, or wait for next appointment time in office and call back.   Please advise.  Thanks !

## 2018-06-12 NOTE — Telephone Encounter (Signed)
Called patient and spoke with wife. They would be okay to do a virtual visit, and would like to have that set up.   I will route to Nurse to make aware to set up his schedule when available.

## 2018-06-12 NOTE — Telephone Encounter (Signed)
Message sent to [facility] to schedule virtual visit with Dr.Croitoru.

## 2018-06-12 NOTE — Telephone Encounter (Signed)
New Message    Pts wife is calling and is wanting to set up an office visit for her husbands follow up appt that is due in June. She doesn't think a Virtual visit is the best option for them.  Please call

## 2018-06-12 NOTE — Telephone Encounter (Signed)
sch appt spk to pt wife mld ltr 06/27/2018 115pm f/u MD

## 2018-06-12 NOTE — Telephone Encounter (Signed)
It seems that he has an appt with Dr. Arbie Cookey at [facility] later this month. That would be to discuss carotid endarterectomy surgery and is more important than his visit with me. A virtual visit with me would be just fine. Mostly  At this point, my job is to assist with risk factor management. He needs a repeat lipid profile no sooner than 3 months after CABG. MCr

## 2018-06-15 ENCOUNTER — Telehealth (INDEPENDENT_AMBULATORY_CARE_PROVIDER_SITE_OTHER): Payer: Managed Care, Other (non HMO) | Admitting: Cardiovascular Disease

## 2018-06-15 ENCOUNTER — Encounter: Payer: Self-pay | Admitting: Cardiovascular Disease

## 2018-06-15 VITALS — BP 121/72 | HR 72 | Ht 71.0 in | Wt 258.0 lb

## 2018-06-15 DIAGNOSIS — G4733 Obstructive sleep apnea (adult) (pediatric): Secondary | ICD-10-CM

## 2018-06-15 DIAGNOSIS — I10 Essential (primary) hypertension: Secondary | ICD-10-CM

## 2018-06-15 DIAGNOSIS — I6523 Occlusion and stenosis of bilateral carotid arteries: Secondary | ICD-10-CM | POA: Diagnosis not present

## 2018-06-15 DIAGNOSIS — E782 Mixed hyperlipidemia: Secondary | ICD-10-CM | POA: Diagnosis not present

## 2018-06-15 DIAGNOSIS — I2510 Atherosclerotic heart disease of native coronary artery without angina pectoris: Secondary | ICD-10-CM | POA: Diagnosis not present

## 2018-06-15 DIAGNOSIS — Z6835 Body mass index (BMI) 35.0-35.9, adult: Secondary | ICD-10-CM

## 2018-06-15 DIAGNOSIS — Z951 Presence of aortocoronary bypass graft: Secondary | ICD-10-CM

## 2018-06-15 NOTE — Patient Instructions (Addendum)
Medication Instructions:  Continue current medications  If you need a refill on your cardiac medications before your next appointment, please call your pharmacy.  Labwork: Fasting Lipids and CMP in 2 Months   Testing/Procedures: None Ordered  Follow-Up: . Your physician recommends that you schedule a follow-up appointment in: Tuesday August 11th @ 1:40 pm   At [facility], you and your health needs are our priority.  As part of our continuing mission to provide you with exceptional heart care, we have created designated Provider Care Teams.  These Care Teams include your primary Cardiologist (physician) and Advanced Practice Providers (APPs -  Physician Assistants and Nurse Practitioners) who all work together to provide you with the care you need, when you need it.  Thank you for choosing [facility] at [facility]!!

## 2018-06-15 NOTE — Progress Notes (Signed)
Virtual Visit via Video Note   This visit type was conducted due to national recommendations for restrictions regarding the COVID-19 Pandemic (e.g. social distancing) in an effort to limit this patient's exposure and mitigate transmission in our community.  Due to his co-morbid illnesses, this patient is at least at moderate risk for complications without adequate follow up.  This format is felt to be most appropriate for this patient at this time.  All issues noted in this document were discussed and addressed.  A limited physical exam was performed with this format.  Please refer to the patient's chart for his consent to telehealth for [facility]   Evaluation Performed:  Follow-up visit  Date:  06/15/2018   ID:  David Christian, DOB May 15, 1975, MRN [id number]  Patient Location: Home  Provider Location: Home  PCP:  Nonnie Done., MD  Cardiologist:  [name], MD  Electrophysiologist:  None   Chief Complaint:  Follow-up after CABG hyperlipidemia carotid stenosis  History of Present Illness:    David Christian is a 43 y.o. male who presents via audio/video conferencing for a telehealth visit today.    David Christian is now roughly 6 weeks following multivessel bypass surgery with bilateral mammary artery conduits (Dr. Dorris Fetch, 05/04/2017, LIMA to LAD, RIMA to OM1, SVG to diagonal 1, sequential SVG to distal RCA and PLV). Preoperative ejection fraction was 45-50%, but there was no evidence of scar nuclear stress testing just extensive anterior wall ischemia.  Postoperative recovery was excellent without congestive heart failure or arrhythmia. He had his follow-up visit with Dr. Dorris Fetch on March 31.  He was incidentally found to have a right carotid stenosis of 80-99% and is scheduled to meet with Dr. Arbie Cookey at the end of this month to discuss endarterectomy.  He is still little sore but is doing better.  He is walking almost every day, about 2 miles in 1 hour.  He has not had  problems with leg edema, palpitations, dyspnea, anginal chest pain, syncope or any focal neurological complaints.  He does not have intermittent claudication, fever, chills or bleeding.  He has mixed hyperlipidemia with a strikingly elevated LDL cholesterol (180-200s).  He is now taking atorvastatin 80 mg daily.  He is also on aspirin and beta-blockers.  The patient does not have symptoms concerning for COVID-19 infection (fever, chills, cough, or new shortness of breath).    Past Medical History:  Diagnosis Date  . Anginal pain (HCC)   . Coronary artery disease   . Hyperlipidemia   . Hypertension   . Sleep apnea    uses CPAP   Past Surgical History:  Procedure Laterality Date  . CORONARY ARTERY BYPASS GRAFT N/A 05/04/2018   Procedure: CORONARY ARTERY BYPASS GRAFTING (CABG) x5 using right greater saphenous vein harvested endoscopically and bilateral left internal mammary arteries;  Surgeon: [name], MD;  Location: [facility] OR;  Service: Open Heart Surgery;  Laterality: N/A;  possible bilateral IMA  . ELBOW SURGERY    . HAND SURGERY    . LEFT HEART CATH AND CORONARY ANGIOGRAPHY N/A 05/03/2018   Procedure: LEFT HEART CATH AND CORONARY ANGIOGRAPHY;  Surgeon: [name], MD;  Location[facility] INVASIVE CV LAB;  Service: Cardiovascular;  Laterality: N/A;  . MASS EXCISION Right 10/31/2014   Procedure: EXCISION ANTERIOR NECK & RIGHT SUBMANDIBULAR CYSTS WITH COMPLEX CLOSURES;  Surgeon: [name], MD;  Location: [facility];  Service: ENT;  Laterality: Right;  CPAP  . TEE WITHOUT CARDIOVERSION N/A 05/04/2018  Procedure: TRANSESOPHAGEAL ECHOCARDIOGRAM (TEE);  Surgeon: [name], MD;  Location: [facility] OR;  Service: Open Heart Surgery;  Laterality: N/A;     Current Meds  Medication Sig  . acetaminophen (TYLENOL) 325 MG tablet Take 2 tablets (650 mg total) by mouth every 6 (six) hours as needed for mild pain.  [name] aspirin EC 325 MG EC tablet Take 1 tablet (325 mg total) by  mouth daily.  [name] atorvastatin (LIPITOR) 80 MG tablet Take 1 tablet (80 mg total) by mouth every evening.  . metoprolol tartrate (LOPRESSOR) 25 MG tablet Take 1 tablet (25 mg total) by mouth 2 (two) times daily.  . sertraline (ZOLOFT) 50 MG tablet Take 50 mg by mouth daily.      Allergies:   Patient has no known allergies.   Social History   Tobacco Use  . Smoking status: Former Smoker    Years: 6.00    Last attempt to quit: 08/07/1999    Years since quitting: 18.8  . Smokeless tobacco: Current User    Types: Chew  Substance Use Topics  . Alcohol use: Yes    Alcohol/week: 1.0 standard drinks    Types: 1 Cans of beer per week    Comment: rare  . Drug use: No     Family Hx: The patient's family history includes Arthritis in his maternal grandfather and paternal grandmother. There is no history of Diabetes, Heart disease, or Stroke.  ROS:   Please see the history of present illness.     All other systems reviewed and are negative.   Prior CV studies:   The following studies were reviewed today:  Coronary angiograms, carotid ultrasound, CT angiogram of the neck, notes from Dr. Dorris Fetch and Dr. Arbie Cookey.  Labs/Other Tests and Data Reviewed:    EKG:  An ECG dated 05/19/2018 was personally reviewed today and demonstrated:  Sinus rhythm with inverted T waves in leads I, aVL, V2  Recent Labs: 05/05/2018: Magnesium 2.1 05/07/2018: BUN 15; Creatinine, Ser 0.98; Hemoglobin 9.9; Platelets 167; Potassium 3.9; Sodium 136   Recent Lipid Panel Lab Results  Component Value Date/Time   CHOL 270 (H) 03/04/2015 08:25 AM   TRIG 217.0 (H) 03/04/2015 08:25 AM   HDL 40.80 03/04/2015 08:25 AM   CHOLHDL 7 03/04/2015 08:25 AM   LDLCALC 205 (H) 05/23/2013 02:16 PM   LDLDIRECT 182.0 03/04/2015 08:25 AM    Wt Readings from Last 3 Encounters:  06/15/18 258 lb (117 kg)  06/06/18 261 lb (118.4 kg)  05/19/18 264 lb 3.2 oz (119.8 kg)     Objective:    Vital Signs:  BP 121/72   Pulse 72   Ht  5\' 11"  (1.803 m)   Wt 258 lb (117 kg)   BMI 35.98 kg/m    Well nourished, well developed male in no acute distress. His sternotomy scar and the chest tube sites have healed very nicely, still a little reddish-pink.  He appears comfortable and relaxed and speaks in full, uninterrupted sentences.  ASSESSMENT & PLAN:    1. CAD s/p CABG: Progressing nicely.  Unfortunately cannot participate in cardiac rehab.  Asked him to gradually increase his level of activity to 3 miles in 1 hour walk.  Long-term we will have to put him at least 2-1/2 hours of walking or similar exercise every week in addition to 30 minutes of something more intense such as weight lifting, basketball, aerobics, etc. 2. 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  We will plan  to reevaluate his lipid profile, no sooner than 3 months after bypass surgery.  Even if he achieves a 50-60% reduction in LDL cholesterol with the statin, he may not reach target LDL less than 70 and we may have to use combination therapy (consider ezetimibe, Vascepa if we are close to target; Repatha if we still need substantial reduction in LDL cholesterol). 3. Carotid stenosis: He has bilateral disease but had a fairly severe stenosis on the right.  Discussed the fact that while he should have carotid endarterectomy it is not urgent and can be delayed until the coronavirus crisis abates. 4. Obesity: Gradual and sustained weight loss is very important for his long-term health.  Discussed the importance of diet and physical exercise in detail.  COVID-19 Education: The signs and symptoms of COVID-19 were discussed with the patient and how to seek care for testing (follow up with PCP or arrange E-visit).  The importance of social distancing was discussed today.  Time:   Today, I have spent 23 minutes with the patient with telehealth technology discussing the above problems.     Medication Adjustments/Labs  and Tests Ordered: Current medicines are reviewed at length with the patient today.  Concerns regarding medicines are outlined above.  Tests Ordered: No orders of the defined types were placed in this encounter.  Medication Changes: No orders of the defined types were placed in this encounter.   Disposition:  Follow up 3-6 months  Signed, Thurmon FairMihai Glorian Mcdonell, MD  06/15/2018 9:27 AM    [facility]

## 2018-06-27 ENCOUNTER — Telehealth: Payer: Self-pay | Admitting: Vascular Surgery

## 2018-06-27 ENCOUNTER — Ambulatory Visit: Payer: Managed Care, Other (non HMO) | Admitting: Vascular Surgery

## 2018-06-27 NOTE — Telephone Encounter (Signed)
The patient was scheduled to see me in the office today.  I called him by telephone and explained that we are still deferring elective surgery until at least June 1.  Recommended that we delay his office visit until mid to end of May and then coordinate surgery in [place] June if elective surgery is available.  He is having no right brain symptoms.  Continue to recover from coronary bypass

## 2018-06-27 NOTE — Telephone Encounter (Signed)
resch appt spk to pt mld ltr 07/25/2018 130pm MD

## 2018-06-27 NOTE — Telephone Encounter (Signed)
-----   Message from [name], MD sent at 06/27/2018  9:17 AM EDT -----  Office visit for today was delayed.  I discussed this with the patient by telephone.  Explained that we are still deferring elective carotid surgery until June 1 at the earliest.  He needs to have an office visit with me in the mid to late May.  Does not need any imaging studies.  Please schedule an appointment with me

## 2018-07-02 ENCOUNTER — Other Ambulatory Visit: Payer: Self-pay | Admitting: Surgical

## 2018-07-04 ENCOUNTER — Other Ambulatory Visit: Payer: Self-pay | Admitting: Cardiovascular Disease

## 2018-07-04 MED ORDER — METOPROLOL TARTRATE 25 MG PO TABS: 25.0000 mg | ORAL_TABLET | Freq: Two times a day (BID) | ORAL | 5 refills | Status: DC

## 2018-07-04 MED ORDER — ATORVASTATIN CALCIUM 80 MG PO TABS: 80.0000 mg | ORAL_TABLET | Freq: Every evening | ORAL | 5 refills | Status: AC

## 2018-07-04 NOTE — Telephone Encounter (Signed)
New Message     *STAT* If patient is at the pharmacy, call can be transferred to refill team.   1. Which medications need to be refilled? (please list name of each medication and dose if known) Metoprolol 25mg , Atorvastatin 80mg   2. Which pharmacy/location (including street and city if local pharmacy) is medication to be sent to? [facility] in [place]  3. Do they need a 30 day or 90 day supply? 90 day supply

## 2018-07-04 NOTE — Telephone Encounter (Signed)
Atorvastatin and lopressor refilled.

## 2018-07-10 ENCOUNTER — Telehealth: Payer: Self-pay | Admitting: Cardiovascular Disease

## 2018-07-10 NOTE — Telephone Encounter (Signed)
Not likely to be heart related, but sounds nerve-root related, such as a possible C-spine problem? If he has any weakness in hand grip, should see an ortho/neuro specialist [facility]

## 2018-07-10 NOTE — Telephone Encounter (Signed)
LM TO CALL BACK ./CY 

## 2018-07-10 NOTE — Telephone Encounter (Signed)
Follow up:    Please return patient call.

## 2018-07-10 NOTE — Telephone Encounter (Signed)
Pt aware of Dr Croitoru's recommendations and notes no weakness. Pt will f/u PCP .[name]

## 2018-07-10 NOTE — Telephone Encounter (Signed)
Spoke with pt and main complaint is numbness sensation( tingling feeling like when goes to sleep) it may happen to both arms ,or maybe right or left it varies "tingling sensation" may start at shoulder and radiate to thumbs.Per pt at this moment has numbness noted to 1/2 of right thumb and pt states gets worse at night when is lying down and may awaken pt throughout night no other symptoms Per pt had been very active and thought could have pulled something but has not done anything really in the last couple of days and remains unchanged Will forward to Dr Royann Shivers for review and recommendations./cy

## 2018-07-10 NOTE — Telephone Encounter (Signed)
° °  Patient calling to report numbness in arms 1 week.  No other symptoms

## 2018-07-25 ENCOUNTER — Ambulatory Visit: Payer: Managed Care, Other (non HMO) | Admitting: Vascular Surgery

## 2018-08-01 ENCOUNTER — Other Ambulatory Visit: Payer: Self-pay

## 2018-08-01 ENCOUNTER — Encounter: Payer: Self-pay | Admitting: Vascular Surgery

## 2018-08-01 ENCOUNTER — Encounter: Payer: Self-pay | Admitting: *Deleted

## 2018-08-01 ENCOUNTER — Ambulatory Visit (INDEPENDENT_AMBULATORY_CARE_PROVIDER_SITE_OTHER): Payer: Managed Care, Other (non HMO) | Admitting: Vascular Surgery

## 2018-08-01 ENCOUNTER — Other Ambulatory Visit: Payer: Self-pay | Admitting: *Deleted

## 2018-08-01 VITALS — BP 106/63 | HR 67 | Temp 97.6°F | Resp 20 | Ht 71.0 in | Wt 258.5 lb

## 2018-08-01 DIAGNOSIS — I6521 Occlusion and stenosis of right carotid artery: Secondary | ICD-10-CM | POA: Diagnosis not present

## 2018-08-01 NOTE — H&P (View-Only) (Signed)
Vascular and Vein Specialist of [facility]  Patient name: David Christian MRN: [id number] DOB: [birth year] Sex: male  REASON FOR VISIT: Continued discussion of severe asymptomatic left internal carotid artery stenosis  HPI: David Christian is a 43 y.o. male here today for follow-up.  Was initially seen in late February.  He had presented with unstable coronary artery disease and underwent coronary artery bypass grafting with Dr. Dorris Fetch on 05/05/2018.  Preoperative imaging revealed high-grade right carotid stenosis.  He is underwent uneventful endarterectomy and is here today for continued discussion.  His plan was for staged right carotid endarterectomy.  This has been delayed due to COVID actions.  He has had no focal neurologic deficits.  He does report that over the past several weeks he has been having numbness and feeling as though his arms go to sleep bilaterally.  This comes and goes and is not seem to be positional.  This may be related to a cervical spine issues.  Is not hemispheric.  He has had no prior history of a aphasia, amaurosis fugax or stroke.  Past Medical History:  Diagnosis Date  . Anginal pain (HCC)   . Coronary artery disease   . Hyperlipidemia   . Hypertension   . Sleep apnea    uses CPAP    Family History  Problem Relation Age of Onset  . Arthritis Maternal Grandfather   . Arthritis Paternal Grandmother   . Diabetes Neg Hx   . Heart disease Neg Hx   . Stroke Neg Hx     SOCIAL HISTORY: Social History   Tobacco Use  . Smoking status: Former Smoker    Years: 6.00    Last attempt to quit: 08/07/1999    Years since quitting: 18.9  . Smokeless tobacco: Current User    Types: Chew  Substance Use Topics  . Alcohol use: Yes    Alcohol/week: 1.0 standard drinks    Types: 1 Cans of beer per week    Comment: rare    No Known Allergies  Current Outpatient Medications  Medication Sig Dispense Refill  . acetaminophen  (TYLENOL) 325 MG tablet Take 2 tablets (650 mg total) by mouth every 6 (six) hours as needed for mild pain.    [name] aspirin EC 325 MG EC tablet Take 1 tablet (325 mg total) by mouth daily.    [name] atorvastatin (LIPITOR) 80 MG tablet Take 1 tablet (80 mg total) by mouth every evening. 30 tablet 5  . metoprolol tartrate (LOPRESSOR) 25 MG tablet Take 1 tablet (25 mg total) by mouth 2 (two) times daily. 60 tablet 5  . sertraline (ZOLOFT) 50 MG tablet Take 50 mg by mouth daily.      No current facility-administered medications for this visit.     REVIEW OF SYSTEMS:  [X]  denotes positive finding, [ ]  denotes negative finding Cardiac  Comments:  Chest pain or chest pressure:    Shortness of breath upon exertion:    Short of breath when lying flat:    Irregular heart rhythm:        Vascular    Pain in calf, thigh, or hip brought on by ambulation:    Pain in feet at night that wakes you up from your sleep:     Blood clot in your veins:    Leg swelling:           PHYSICAL EXAM: Vitals:   08/01/18 0840  BP: 106/63  Pulse: 67  Resp: 20  Temp:  97.6 F (36.4 C)  SpO2: 97%  Weight: 258 lb 8 oz (117.3 kg)  Height: 5' 11" (1.803 m)    GENERAL: The patient is a well-nourished male, in no acute distress. The vital signs are documented above. CARDIOVASCULAR: Carotid arteries are without bruits bilaterally.  2+ radial pulses bilaterally PULMONARY: There is good air exchange  MUSCULOSKELETAL: There are no major deformities or cyanosis. NEUROLOGIC: No focal weakness or paresthesias are detected. SKIN: There are no ulcers or rashes noted. PSYCHIATRIC: The patient has a normal affect.  DATA:  CT angiogram was reviewed again with the patient.  This shows high-grade stenosis of his right internal carotid.  No significant left carotid stenosis.  He has an extremely irregular mixed plaque in the right carotid bifurcation  MEDICAL ISSUES: I have recommended right carotid endarterectomy for reduction of  stroke risk.  Discussed the procedure including 1 to 2% risk for stroke with surgery.  Explained that this would be an anticipated overnight hospitalization.  We will schedule this at his earliest convenience    Daundre Biel F. Camelle Henkels, MD FACS [facility] [place] Office Tel [phone] Pager [phone]  

## 2018-08-01 NOTE — Progress Notes (Signed)
Vascular and Vein Specialist of [facility]  Patient name: David Christian MRN: [id number] DOB: [birth year] Sex: male  REASON FOR VISIT: Continued discussion of severe asymptomatic left internal carotid artery stenosis  HPI: David Christian is a 43 y.o. male here today for follow-up.  Was initially seen in late February.  He had presented with unstable coronary artery disease and underwent coronary artery bypass grafting with Dr. Dorris Fetch on 05/05/2018.  Preoperative imaging revealed high-grade right carotid stenosis.  He is underwent uneventful endarterectomy and is here today for continued discussion.  His plan was for staged right carotid endarterectomy.  This has been delayed due to COVID actions.  He has had no focal neurologic deficits.  He does report that over the past several weeks he has been having numbness and feeling as though his arms go to sleep bilaterally.  This comes and goes and is not seem to be positional.  This may be related to a cervical spine issues.  Is not hemispheric.  He has had no prior history of a aphasia, amaurosis fugax or stroke.  Past Medical History:  Diagnosis Date  . Anginal pain (HCC)   . Coronary artery disease   . Hyperlipidemia   . Hypertension   . Sleep apnea    uses CPAP    Family History  Problem Relation Age of Onset  . Arthritis Maternal Grandfather   . Arthritis Paternal Grandmother   . Diabetes Neg Hx   . Heart disease Neg Hx   . Stroke Neg Hx     SOCIAL HISTORY: Social History   Tobacco Use  . Smoking status: Former Smoker    Years: 6.00    Last attempt to quit: 08/07/1999    Years since quitting: 18.9  . Smokeless tobacco: Current User    Types: Chew  Substance Use Topics  . Alcohol use: Yes    Alcohol/week: 1.0 standard drinks    Types: 1 Cans of beer per week    Comment: rare    No Known Allergies  Current Outpatient Medications  Medication Sig Dispense Refill  . acetaminophen  (TYLENOL) 325 MG tablet Take 2 tablets (650 mg total) by mouth every 6 (six) hours as needed for mild pain.    [name] aspirin EC 325 MG EC tablet Take 1 tablet (325 mg total) by mouth daily.    [name] atorvastatin (LIPITOR) 80 MG tablet Take 1 tablet (80 mg total) by mouth every evening. 30 tablet 5  . metoprolol tartrate (LOPRESSOR) 25 MG tablet Take 1 tablet (25 mg total) by mouth 2 (two) times daily. 60 tablet 5  . sertraline (ZOLOFT) 50 MG tablet Take 50 mg by mouth daily.      No current facility-administered medications for this visit.     REVIEW OF SYSTEMS:  [X]  denotes positive finding, [ ]  denotes negative finding Cardiac  Comments:  Chest pain or chest pressure:    Shortness of breath upon exertion:    Short of breath when lying flat:    Irregular heart rhythm:        Vascular    Pain in calf, thigh, or hip brought on by ambulation:    Pain in feet at night that wakes you up from your sleep:     Blood clot in your veins:    Leg swelling:           PHYSICAL EXAM: Vitals:   08/01/18 0840  BP: 106/63  Pulse: 67  Resp: 20  Temp:  97.6 F (36.4 C)  SpO2: 97%  Weight: 258 lb 8 oz (117.3 kg)  Height: 5\' 11"  (1.803 m)    GENERAL: The patient is a well-nourished male, in no acute distress. The vital signs are documented above. CARDIOVASCULAR: Carotid arteries are without bruits bilaterally.  2+ radial pulses bilaterally PULMONARY: There is good air exchange  MUSCULOSKELETAL: There are no major deformities or cyanosis. NEUROLOGIC: No focal weakness or paresthesias are detected. SKIN: There are no ulcers or rashes noted. PSYCHIATRIC: The patient has a normal affect.  DATA:  CT angiogram was reviewed again with the patient.  This shows high-grade stenosis of his right internal carotid.  No significant left carotid stenosis.  He has an extremely irregular mixed plaque in the right carotid bifurcation  MEDICAL ISSUES: I have recommended right carotid endarterectomy for reduction of  stroke risk.  Discussed the procedure including 1 to 2% risk for stroke with surgery.  Explained that this would be an anticipated overnight hospitalization.  We will schedule this at his earliest convenience    Larina Earthlyodd F. Aric Jost, MD [facility] Office Tel [phone] Pager [phone]

## 2018-08-01 NOTE — Progress Notes (Signed)
Thank you, Tawanna Cooler!

## 2018-08-09 NOTE — Progress Notes (Signed)
[id number] - [place], [place] - [address] [place] [address] Phone: [phone] Fax: [phone]  [facility] [address] - [place], [place] - [address] [place] [address] Phone: [phone] Fax: [phone]      Your procedure is scheduled on 08/11/2018.  Report to [facility] Main Entrance "A" at 05:30 A.M., and check in at the Admitting office.  Call this number if you have problems the morning of surgery:  [phone]  Call [phone] if you have any questions prior to your surgery date Monday-Friday 8am-4pm    Remember:  Do not eat or drink after midnight.     Take these medicines the morning of surgery with A SIP OF WATER: loratidine (Claritin) Metoprolol Tartrate (Lopressor) Sertraline (Zoloft)  7 days prior to surgery STOP taking any Aspirin (unless otherwise instructed by your surgeon), Aleve, Naproxen, Ibuprofen, Motrin, Advil, Goody's, BC's, all herbal medications, fish oil, and all vitamins.  Follow your surgeon's instructions on when to stop Aspirin.  If no instructions were given by your surgeon then you will need to call the office to get those instructions.      The Morning of Surgery  Do not wear jewelry, make-up or nail polish.  Do not wear lotions, powders, or perfumes/colognes, or deodorant  Do not shave 48 hours prior to surgery.  Men may shave face and neck.  Do not bring valuables to the hospital.  [facility] is not responsible for any belongings or valuables.  If you are a smoker, DO NOT Smoke 24 hours prior to surgery IF you wear a CPAP at night please bring your mask, tubing, and machine the morning of surgery   Remember that you must have someone to transport you home after your surgery, and remain with you for 24 hours if you are discharged the same day.   Contacts, glasses, hearing aids, dentures or bridgework may not be worn into surgery.    Leave your suitcase in the  car.  After surgery it may be brought to your room.  For patients admitted to the hospital, discharge time will be determined by your treatment team.  Patients discharged the day of surgery will not be allowed to drive home.    Special instructions:   [place]- Preparing For Surgery  Before surgery, you can play an important role. Because skin is not sterile, your skin needs to be as free of germs as possible. You can reduce the number of germs on your skin by washing with CHG (chlorahexidine gluconate) Soap before surgery.  CHG is an antiseptic cleaner which kills germs and bonds with the skin to continue killing germs even after washing.    Oral Hygiene is also important to reduce your risk of infection.  Remember - BRUSH YOUR TEETH THE MORNING OF SURGERY WITH YOUR REGULAR TOOTHPASTE  Please do not use if you have an allergy to CHG or antibacterial soaps. If your skin becomes reddened/irritated stop using the CHG.  Do not shave (including legs and underarms) for at least 48 hours prior to first CHG shower. It is OK to shave your face.  Please follow these instructions carefully.   1. Shower the NIGHT BEFORE SURGERY and the MORNING OF SURGERY with CHG Soap.   2. If you chose to wash your hair, wash your hair first as usual with your normal shampoo.  3. After you shampoo, rinse your hair and body thoroughly to remove the shampoo.  4. Use CHG as you would any other liquid soap. You can apply CHG directly to the skin and wash gently with a scrungie or a clean washcloth.   5. Apply the CHG Soap to your body ONLY FROM THE NECK DOWN.  Do not use on open wounds or open sores. Avoid contact with your eyes, ears, mouth and genitals (private parts). Wash Face and genitals (private parts)  with your normal soap.   6. Wash thoroughly, paying special attention to the area where your surgery will be performed.  7. Thoroughly rinse your body with warm water from the neck down.  8. DO NOT  shower/wash with your normal soap after using and rinsing off the CHG Soap.  9. Pat yourself dry with a CLEAN TOWEL.  10. Wear CLEAN PAJAMAS to bed the night before surgery, wear comfortable clothes the morning of surgery  11. Place CLEAN SHEETS on your bed the night of your first shower and DO NOT SLEEP WITH PETS.    Day of Surgery:  Do not apply any deodorants/lotions.  Please wear clean clothes to the hospital/surgery center.   Remember to brush your teeth WITH YOUR REGULAR TOOTHPASTE.   Please read over the following fact sheets that you were given.

## 2018-08-10 ENCOUNTER — Encounter (HOSPITAL_COMMUNITY)
Admission: RE | Admit: 2018-08-10 | Discharge: 2018-08-10 | Disposition: A | Discharge status: Home / Self Care | Payer: Managed Care, Other (non HMO) | Source: Ambulatory Visit | Attending: Vascular Surgery | Admitting: Vascular Surgery

## 2018-08-10 ENCOUNTER — Encounter (HOSPITAL_COMMUNITY): Payer: Self-pay

## 2018-08-10 ENCOUNTER — Other Ambulatory Visit (HOSPITAL_COMMUNITY)
Admission: RE | Admit: 2018-08-10 | Discharge: 2018-08-10 | Disposition: A | Discharge status: Home / Self Care | Payer: Managed Care, Other (non HMO) | Source: Ambulatory Visit | Attending: Vascular Surgery | Admitting: Vascular Surgery

## 2018-08-10 ENCOUNTER — Other Ambulatory Visit: Payer: Self-pay

## 2018-08-10 DIAGNOSIS — Z1159 Encounter for screening for other viral diseases: Secondary | ICD-10-CM | POA: Diagnosis not present

## 2018-08-10 HISTORY — DX: Gastro-esophageal reflux disease without esophagitis: K21.9

## 2018-08-10 LAB — URINALYSIS, ROUTINE W REFLEX MICROSCOPIC
Bilirubin Urine: NEGATIVE
Glucose, UA: NEGATIVE mg/dL
Hgb urine dipstick: NEGATIVE
Ketones, ur: NEGATIVE mg/dL
Leukocytes,Ua: NEGATIVE
Nitrite: NEGATIVE
Protein, ur: NEGATIVE mg/dL
Specific Gravity, Urine: 1.018 (ref 1.005–1.030)
pH: 5 (ref 5.0–8.0)

## 2018-08-10 LAB — COMPREHENSIVE METABOLIC PANEL
ALT: 15 U/L (ref 0–44)
AST: 22 U/L (ref 15–41)
Albumin: 3.5 g/dL (ref 3.5–5.0)
Alkaline Phosphatase: 69 U/L (ref 38–126)
Anion gap: 10 (ref 5–15)
BUN: 14 mg/dL (ref 6–20)
CO2: 20 mmol/L — ABNORMAL LOW (ref 22–32)
Calcium: 8.9 mg/dL (ref 8.9–10.3)
Chloride: 107 mmol/L (ref 98–111)
Creatinine, Ser: 0.85 mg/dL (ref 0.61–1.24)
GFR calc Af Amer: >60 mL/min (ref >60)
GFR calc non Af Amer: >60 mL/min (ref >60)
Glucose, Bld: 95 mg/dL (ref 70–99)
Potassium: 3.8 mmol/L (ref 3.5–5.1)
Sodium: 137 mmol/L (ref 135–145)
Total Bilirubin: 0.7 mg/dL (ref 0.3–1.2)
Total Protein: 6.7 g/dL (ref 6.5–8.1)

## 2018-08-10 LAB — CBC
HCT: 46 % (ref 39.0–52.0)
Hemoglobin: 14.1 g/dL (ref 13.0–17.0)
MCH: 26.4 pg (ref 26.0–34.0)
MCHC: 30.7 g/dL (ref 30.0–36.0)
MCV: 86.1 fL (ref 80.0–100.0)
Platelets: 266 10*3/uL (ref 150–400)
RBC: 5.34 MIL/uL (ref 4.22–5.81)
RDW: 14.9 % (ref 11.5–15.5)
WBC: 7.2 10*3/uL (ref 4.0–10.5)
nRBC: 0 % (ref 0.0–0.2)

## 2018-08-10 LAB — SARS CORONAVIRUS 2 BY RT PCR (HOSPITAL ORDER, PERFORMED IN ~~LOC~~ HOSPITAL LAB): SARS Coronavirus 2: NEGATIVE

## 2018-08-10 LAB — APTT: aPTT: 36 seconds (ref 24–36)

## 2018-08-10 LAB — TYPE AND SCREEN
ABO/RH(D): O POS
Antibody Screen: NEGATIVE

## 2018-08-10 LAB — SURGICAL PCR SCREEN
MRSA, PCR: NEGATIVE
Staphylococcus aureus: NEGATIVE

## 2018-08-10 LAB — PROTIME-INR
INR: 1.2 (ref 0.8–1.2)
Prothrombin Time: 14.6 seconds (ref 11.4–15.2)

## 2018-08-10 MED ORDER — DEXTROSE 5 % IV SOLN
3.0000 g | INTRAVENOUS | Status: AC
  Administered 2018-08-11: 2 g via INTRAVENOUS
  Administered 2018-08-11: 1 g via INTRAVENOUS
  Filled 2018-08-10: qty 3000

## 2018-08-10 NOTE — Progress Notes (Signed)
PCP - Wynelle Bourgeois in [place] Cardiologist - croitoru  Chest x-ray -3/20  EKG -  Stress Test - 3/20 ECHO - 2/20 Cardiac Cath - 2/20  Sleep Study - 10 yrs. ago CPAP - yes  Fasting Blood Sugar - na Checks Blood Sugar _____ times a day  Blood Thinner Instructions: Aspirin Instructions: will not take day of surgery  Anesthesia review:   Patient denies shortness of breath, fever, cough and chest pain at PAT appointment   Patient verbalized understanding of instructions that were given to them at the PAT appointment. Patient was also instructed that they will need to review over the PAT instructions again at home before surgery.

## 2018-08-10 NOTE — Anesthesia Preprocedure Evaluation (Addendum)
Anesthesia Evaluation  Patient identified by MRN, date of birth, ID band Patient awake    Reviewed: Allergy & Precautions, NPO status , Patient's Chart, lab work & pertinent test results  History of Anesthesia Complications Negative for: history of anesthetic complications  Airway Mallampati: II  TM Distance: >3 FB Neck ROM: Full    Dental no notable dental hx. (+) Dental Advisory Given   Pulmonary sleep apnea , former smoker,    Pulmonary exam normal        Cardiovascular hypertension, Pt. on medications + CAD and + CABG  Normal cardiovascular exam  IMPRESSIONS    1. The left ventricle has hyperdynamic systolic function, with an ejection fraction of >65%. The cavity size was normal. Left ventricular diastolic Doppler parameters are consistent with impaired relaxation.  2. The right ventricle has normal systolic function. The cavity was normal. There is no increase in right ventricular wall thickness.  3. The mitral valve is normal in structure.  4. The tricuspid valve is normal in structure.  5. The aortic valve is tricuspid Mild thickening of the aortic valve Mild calcification of the aortic valve.  6. The pulmonic valve was normal in structure.   Neuro/Psych negative neurological ROS  negative psych ROS   GI/Hepatic Neg liver ROS, GERD  ,  Endo/Other  Morbid obesity  Renal/GU negative Renal ROS     Musculoskeletal   Abdominal   Peds  Hematology   Anesthesia Other Findings   Reproductive/Obstetrics                            Anesthesia Physical  Anesthesia Plan  ASA: III  Anesthesia Plan: General   Post-op Pain Management:    Induction: Intravenous  PONV Risk Score and Plan: 3 and Scopolamine patch - Pre-op, Ondansetron and Dexamethasone  Airway Management Planned: Oral ETT  Additional Equipment: Arterial line  Intra-op Plan:   Post-operative Plan: Extubation in  OR  Informed Consent: I have reviewed the patients History and Physical, chart, labs and discussed the procedure including the risks, benefits and alternatives for the proposed anesthesia with the patient or authorized representative who has indicated his/her understanding and acceptance.     Dental advisory given  Plan Discussed with: CRNA and Anesthesiologist  Anesthesia Plan Comments:        Anesthesia Quick Evaluation

## 2018-08-11 ENCOUNTER — Inpatient Hospital Stay (HOSPITAL_COMMUNITY)
Admission: RE | Admit: 2018-08-11 | Discharge: 2018-08-12 | DRG: 039 | Disposition: A | Discharge status: Home / Self Care | Payer: Managed Care, Other (non HMO) | Attending: Vascular Surgery | Admitting: Vascular Surgery

## 2018-08-11 ENCOUNTER — Encounter (HOSPITAL_COMMUNITY)
Admission: RE | Disposition: A | Discharge status: Home / Self Care | Payer: Self-pay | Source: Home / Self Care | Attending: Vascular Surgery

## 2018-08-11 ENCOUNTER — Encounter (HOSPITAL_COMMUNITY): Payer: Self-pay

## 2018-08-11 ENCOUNTER — Inpatient Hospital Stay (HOSPITAL_COMMUNITY): Payer: Managed Care, Other (non HMO) | Admitting: Physician Assistant

## 2018-08-11 ENCOUNTER — Inpatient Hospital Stay (HOSPITAL_COMMUNITY): Payer: Managed Care, Other (non HMO) | Admitting: Certified Registered"

## 2018-08-11 DIAGNOSIS — Z79899 Other long term (current) drug therapy: Secondary | ICD-10-CM

## 2018-08-11 DIAGNOSIS — K219 Gastro-esophageal reflux disease without esophagitis: Secondary | ICD-10-CM | POA: Diagnosis present

## 2018-08-11 DIAGNOSIS — Z7982 Long term (current) use of aspirin: Secondary | ICD-10-CM | POA: Diagnosis not present

## 2018-08-11 DIAGNOSIS — Z8261 Family history of arthritis: Secondary | ICD-10-CM | POA: Diagnosis not present

## 2018-08-11 DIAGNOSIS — G4730 Sleep apnea, unspecified: Secondary | ICD-10-CM | POA: Diagnosis present

## 2018-08-11 DIAGNOSIS — Z951 Presence of aortocoronary bypass graft: Secondary | ICD-10-CM | POA: Diagnosis not present

## 2018-08-11 DIAGNOSIS — I10 Essential (primary) hypertension: Secondary | ICD-10-CM | POA: Diagnosis present

## 2018-08-11 DIAGNOSIS — E785 Hyperlipidemia, unspecified: Secondary | ICD-10-CM | POA: Diagnosis present

## 2018-08-11 DIAGNOSIS — F17290 Nicotine dependence, other tobacco product, uncomplicated: Secondary | ICD-10-CM | POA: Diagnosis present

## 2018-08-11 DIAGNOSIS — I6529 Occlusion and stenosis of unspecified carotid artery: Secondary | ICD-10-CM | POA: Diagnosis present

## 2018-08-11 DIAGNOSIS — I2510 Atherosclerotic heart disease of native coronary artery without angina pectoris: Secondary | ICD-10-CM | POA: Diagnosis present

## 2018-08-11 DIAGNOSIS — I6521 Occlusion and stenosis of right carotid artery: Principal | ICD-10-CM | POA: Diagnosis present

## 2018-08-11 HISTORY — PX: ENDARTERECTOMY: SHX5162

## 2018-08-11 HISTORY — PX: PATCH ANGIOPLASTY: SHX6230

## 2018-08-11 LAB — CBC
HCT: 41.5 % (ref 39.0–52.0)
Hemoglobin: 13.2 g/dL (ref 13.0–17.0)
MCH: 27 pg (ref 26.0–34.0)
MCHC: 31.8 g/dL (ref 30.0–36.0)
MCV: 84.9 fL (ref 80.0–100.0)
Platelets: 261 10*3/uL (ref 150–400)
RBC: 4.89 MIL/uL (ref 4.22–5.81)
RDW: 15 % (ref 11.5–15.5)
WBC: 11.4 10*3/uL — ABNORMAL HIGH (ref 4.0–10.5)
nRBC: 0 % (ref 0.0–0.2)

## 2018-08-11 LAB — CREATININE, SERUM
Creatinine, Ser: 1.12 mg/dL (ref 0.61–1.24)
GFR calc Af Amer: >60 mL/min (ref >60)
GFR calc non Af Amer: >60 mL/min (ref >60)

## 2018-08-11 SURGERY — ENDARTERECTOMY, CAROTID
Anesthesia: General | Site: Neck | Laterality: Right

## 2018-08-11 MED ORDER — LIDOCAINE HCL 4 % EX SOLN
CUTANEOUS | Status: DC | PRN
  Administered 2018-08-11: 5 mL via TOPICAL

## 2018-08-11 MED ORDER — PROPOFOL 10 MG/ML IV BOLUS
INTRAVENOUS | Status: AC
  Filled 2018-08-11: qty 40

## 2018-08-11 MED ORDER — FENTANYL CITRATE (PF) 100 MCG/2ML IJ SOLN
INTRAMUSCULAR | Status: AC
  Filled 2018-08-11: qty 2

## 2018-08-11 MED ORDER — PROPOFOL 10 MG/ML IV BOLUS
INTRAVENOUS | Status: DC | PRN
  Administered 2018-08-11: 200 mg via INTRAVENOUS

## 2018-08-11 MED ORDER — ROCURONIUM BROMIDE 10 MG/ML (PF) SYRINGE
PREFILLED_SYRINGE | INTRAVENOUS | Status: DC | PRN
  Administered 2018-08-11: 50 mg via INTRAVENOUS
  Administered 2018-08-11: 20 mg via INTRAVENOUS

## 2018-08-11 MED ORDER — PHENYLEPHRINE 40 MCG/ML (10ML) SYRINGE FOR IV PUSH (FOR BLOOD PRESSURE SUPPORT)
PREFILLED_SYRINGE | INTRAVENOUS | Status: DC | PRN
  Administered 2018-08-11 (×2): 40 ug via INTRAVENOUS

## 2018-08-11 MED ORDER — SODIUM CHLORIDE 0.9 % IV SOLN
INTRAVENOUS | Status: AC
  Filled 2018-08-11: qty 1.2

## 2018-08-11 MED ORDER — SODIUM CHLORIDE 0.9 % IV SOLN: 500.0000 mL | Freq: Once | INTRAVENOUS | Status: DC | PRN

## 2018-08-11 MED ORDER — ACETAMINOPHEN 325 MG PO TABS: 325.0000 mg | ORAL_TABLET | ORAL | Status: DC | PRN

## 2018-08-11 MED ORDER — FENTANYL CITRATE (PF) 250 MCG/5ML IJ SOLN
INTRAMUSCULAR | Status: AC
  Filled 2018-08-11: qty 5

## 2018-08-11 MED ORDER — PHENOL 1.4 % MT LIQD: 1.0000 | OROMUCOSAL | Status: DC | PRN

## 2018-08-11 MED ORDER — OXYCODONE-ACETAMINOPHEN 5-325 MG PO TABS
1.0000 | ORAL_TABLET | ORAL | Status: DC | PRN
  Administered 2018-08-11 – 2018-08-12 (×4): 2 via ORAL
  Filled 2018-08-11 (×4): qty 2

## 2018-08-11 MED ORDER — ONDANSETRON HCL 4 MG/2ML IJ SOLN: 4.0000 mg | Freq: Four times a day (QID) | INTRAMUSCULAR | Status: DC | PRN

## 2018-08-11 MED ORDER — DOCUSATE SODIUM 100 MG PO CAPS
100.0000 mg | ORAL_CAPSULE | Freq: Every day | ORAL | Status: DC
  Administered 2018-08-12: 08:00:00 100 mg via ORAL
  Filled 2018-08-11: qty 1

## 2018-08-11 MED ORDER — LABETALOL HCL 5 MG/ML IV SOLN: 10.0000 mg | INTRAVENOUS | Status: DC | PRN

## 2018-08-11 MED ORDER — SODIUM CHLORIDE 0.9 % IV SOLN
INTRAVENOUS | Status: DC | PRN
  Administered 2018-08-11: 500 mL

## 2018-08-11 MED ORDER — METOPROLOL TARTRATE 25 MG PO TABS
25.0000 mg | ORAL_TABLET | Freq: Two times a day (BID) | ORAL | Status: DC
  Administered 2018-08-11 – 2018-08-12 (×2): 25 mg via ORAL
  Filled 2018-08-11 (×2): qty 1

## 2018-08-11 MED ORDER — DEXAMETHASONE SODIUM PHOSPHATE 10 MG/ML IJ SOLN
INTRAMUSCULAR | Status: DC | PRN
  Administered 2018-08-11: 10 mg via INTRAVENOUS

## 2018-08-11 MED ORDER — SODIUM CHLORIDE 0.9 % IV SOLN
INTRAVENOUS | Status: DC | PRN
  Administered 2018-08-11: 25 ug/min via INTRAVENOUS

## 2018-08-11 MED ORDER — HEPARIN SODIUM (PORCINE) 1000 UNIT/ML IJ SOLN
INTRAMUSCULAR | Status: DC | PRN
  Administered 2018-08-11: 10000 [IU] via INTRAVENOUS

## 2018-08-11 MED ORDER — MIDAZOLAM HCL 2 MG/2ML IJ SOLN
INTRAMUSCULAR | Status: AC
  Filled 2018-08-11: qty 2

## 2018-08-11 MED ORDER — FENTANYL CITRATE (PF) 250 MCG/5ML IJ SOLN
INTRAMUSCULAR | Status: DC | PRN
  Administered 2018-08-11: 150 ug via INTRAVENOUS

## 2018-08-11 MED ORDER — BISACODYL 5 MG PO TBEC: 5.0000 mg | DELAYED_RELEASE_TABLET | Freq: Every day | ORAL | Status: DC | PRN

## 2018-08-11 MED ORDER — ASPIRIN EC 325 MG PO TBEC: 325.0000 mg | DELAYED_RELEASE_TABLET | Freq: Every day | ORAL | Status: DC

## 2018-08-11 MED ORDER — ENOXAPARIN SODIUM 40 MG/0.4ML ~~LOC~~ SOLN: 40.0000 mg | SUBCUTANEOUS | Status: DC

## 2018-08-11 MED ORDER — SENNOSIDES-DOCUSATE SODIUM 8.6-50 MG PO TABS: 1.0000 | ORAL_TABLET | Freq: Every evening | ORAL | Status: DC | PRN

## 2018-08-11 MED ORDER — ACETAMINOPHEN 325 MG RE SUPP: 325.0000 mg | RECTAL | Status: DC | PRN

## 2018-08-11 MED ORDER — PROTAMINE SULFATE 10 MG/ML IV SOLN
INTRAVENOUS | Status: DC | PRN
  Administered 2018-08-11: 50 mg via INTRAVENOUS

## 2018-08-11 MED ORDER — 0.9 % SODIUM CHLORIDE (POUR BTL) OPTIME
TOPICAL | Status: DC | PRN
  Administered 2018-08-11: 1000 mL

## 2018-08-11 MED ORDER — SUCCINYLCHOLINE CHLORIDE 20 MG/ML IJ SOLN
INTRAMUSCULAR | Status: DC | PRN
  Administered 2018-08-11: 120 mg via INTRAVENOUS

## 2018-08-11 MED ORDER — SODIUM CHLORIDE 0.9 % IV SOLN
INTRAVENOUS | Status: DC
  Administered 2018-08-11 – 2018-08-12 (×3): via INTRAVENOUS

## 2018-08-11 MED ORDER — CEFAZOLIN SODIUM-DEXTROSE 2-4 GM/100ML-% IV SOLN
INTRAVENOUS | Status: AC
  Filled 2018-08-11: qty 100

## 2018-08-11 MED ORDER — PROMETHAZINE HCL 25 MG/ML IJ SOLN: 6.2500 mg | INTRAMUSCULAR | Status: DC | PRN

## 2018-08-11 MED ORDER — ONDANSETRON HCL 4 MG/2ML IJ SOLN
INTRAMUSCULAR | Status: DC | PRN
  Administered 2018-08-11: 4 mg via INTRAVENOUS

## 2018-08-11 MED ORDER — HYDROMORPHONE HCL 1 MG/ML IJ SOLN
0.5000 mg | INTRAMUSCULAR | Status: DC | PRN
  Administered 2018-08-11: 14:00:00 1 mg via INTRAVENOUS
  Filled 2018-08-11: qty 1

## 2018-08-11 MED ORDER — ASPIRIN EC 325 MG PO TBEC
325.0000 mg | DELAYED_RELEASE_TABLET | Freq: Every day | ORAL | Status: DC
  Administered 2018-08-12: 08:00:00 325 mg via ORAL
  Filled 2018-08-11: qty 1

## 2018-08-11 MED ORDER — SERTRALINE HCL 50 MG PO TABS
50.0000 mg | ORAL_TABLET | Freq: Every day | ORAL | Status: DC
  Administered 2018-08-12: 50 mg via ORAL
  Filled 2018-08-11: qty 1

## 2018-08-11 MED ORDER — POTASSIUM CHLORIDE CRYS ER 20 MEQ PO TBCR: 20.0000 meq | EXTENDED_RELEASE_TABLET | Freq: Every day | ORAL | Status: DC | PRN

## 2018-08-11 MED ORDER — LORATADINE 10 MG PO TABS
10.0000 mg | ORAL_TABLET | Freq: Every day | ORAL | Status: DC | PRN
  Administered 2018-08-11: 10 mg via ORAL
  Filled 2018-08-11: qty 1

## 2018-08-11 MED ORDER — CEFAZOLIN SODIUM-DEXTROSE 1-4 GM/50ML-% IV SOLN
1.0000 g | INTRAVENOUS | Status: DC
  Filled 2018-08-11: qty 50

## 2018-08-11 MED ORDER — SUGAMMADEX SODIUM 200 MG/2ML IV SOLN
INTRAVENOUS | Status: DC | PRN
  Administered 2018-08-11: 250 mg via INTRAVENOUS

## 2018-08-11 MED ORDER — LORATADINE 10 MG PO TABS
10.0000 mg | ORAL_TABLET | Freq: Every day | ORAL | Status: DC
  Administered 2018-08-12: 08:00:00 10 mg via ORAL
  Filled 2018-08-11: qty 1

## 2018-08-11 MED ORDER — METOPROLOL TARTRATE 5 MG/5ML IV SOLN: 2.0000 mg | INTRAVENOUS | Status: DC | PRN

## 2018-08-11 MED ORDER — ALUM & MAG HYDROXIDE-SIMETH 200-200-20 MG/5ML PO SUSP: 15.0000 mL | ORAL | Status: DC | PRN

## 2018-08-11 MED ORDER — SODIUM CHLORIDE 0.9 % IV SOLN
INTRAVENOUS | Status: DC
  Administered 2018-08-11: 08:00:00 via INTRAVENOUS

## 2018-08-11 MED ORDER — SODIUM CHLORIDE 0.9 % IV SOLN
0.0125 ug/kg/min | INTRAVENOUS | Status: DC
  Administered 2018-08-11: 08:00:00 .1 ug/kg/min via INTRAVENOUS
  Filled 2018-08-11 (×2): qty 2000

## 2018-08-11 MED ORDER — GUAIFENESIN-DM 100-10 MG/5ML PO SYRP: 15.0000 mL | ORAL_SOLUTION | ORAL | Status: DC | PRN

## 2018-08-11 MED ORDER — ATORVASTATIN CALCIUM 80 MG PO TABS
80.0000 mg | ORAL_TABLET | Freq: Every evening | ORAL | Status: DC
  Administered 2018-08-11: 80 mg via ORAL
  Filled 2018-08-11: qty 1

## 2018-08-11 MED ORDER — PANTOPRAZOLE SODIUM 40 MG PO TBEC
40.0000 mg | DELAYED_RELEASE_TABLET | Freq: Every day | ORAL | Status: DC
  Administered 2018-08-12: 08:00:00 40 mg via ORAL
  Filled 2018-08-11: qty 1

## 2018-08-11 MED ORDER — LIDOCAINE HCL (PF) 1 % IJ SOLN
INTRAMUSCULAR | Status: AC
  Filled 2018-08-11: qty 30

## 2018-08-11 MED ORDER — MAGNESIUM SULFATE 2 GM/50ML IV SOLN: 2.0000 g | Freq: Every day | INTRAVENOUS | Status: DC | PRN

## 2018-08-11 MED ORDER — HYDRALAZINE HCL 20 MG/ML IJ SOLN: 5.0000 mg | INTRAMUSCULAR | Status: DC | PRN

## 2018-08-11 MED ORDER — FENTANYL CITRATE (PF) 100 MCG/2ML IJ SOLN
25.0000 ug | INTRAMUSCULAR | Status: DC | PRN
  Administered 2018-08-11 (×3): 50 ug via INTRAVENOUS

## 2018-08-11 MED ORDER — CHLORHEXIDINE GLUCONATE CLOTH 2 % EX PADS: 6.0000 | MEDICATED_PAD | Freq: Once | CUTANEOUS | Status: DC

## 2018-08-11 MED ORDER — MIDAZOLAM HCL 5 MG/5ML IJ SOLN
INTRAMUSCULAR | Status: DC | PRN
  Administered 2018-08-11 (×2): 1 mg via INTRAVENOUS

## 2018-08-11 MED ORDER — CEFAZOLIN SODIUM-DEXTROSE 2-4 GM/100ML-% IV SOLN
2.0000 g | Freq: Three times a day (TID) | INTRAVENOUS | Status: AC
  Administered 2018-08-11 (×2): 2 g via INTRAVENOUS
  Filled 2018-08-11 (×2): qty 100

## 2018-08-11 SURGICAL SUPPLY — 42 items
ADH SKN CLS APL DERMABOND .7 (GAUZE/BANDAGES/DRESSINGS) ×1
ADH SKN CLS LQ APL DERMABOND (GAUZE/BANDAGES/DRESSINGS) ×1
CANISTER SUCT 3000ML PPV (MISCELLANEOUS) ×3 IMPLANT
CANNULA VESSEL 3MM 2 BLNT TIP (CANNULA) ×6 IMPLANT
CATH ROBINSON RED A/P 18FR (CATHETERS) ×3 IMPLANT
CLIP LIGATING EXTRA MED SLVR (CLIP) ×3 IMPLANT
CLIP LIGATING EXTRA SM BLUE (MISCELLANEOUS) ×3 IMPLANT
COVER WAND RF STERILE (DRAPES) ×3 IMPLANT
CRADLE DONUT ADULT HEAD (MISCELLANEOUS) ×3 IMPLANT
DECANTER SPIKE VIAL GLASS SM (MISCELLANEOUS) IMPLANT
DERMABOND ADHESIVE PROPEN (GAUZE/BANDAGES/DRESSINGS) ×2
DERMABOND ADVANCED (GAUZE/BANDAGES/DRESSINGS) ×2
DERMABOND ADVANCED .7 DNX12 (GAUZE/BANDAGES/DRESSINGS) ×1 IMPLANT
DERMABOND ADVANCED .7 DNX6 (GAUZE/BANDAGES/DRESSINGS) IMPLANT
DRAIN HEMOVAC 1/8 X 5 (WOUND CARE) IMPLANT
ELECT REM PT RETURN 9FT ADLT (ELECTROSURGICAL) ×3
ELECTRODE REM PT RTRN 9FT ADLT (ELECTROSURGICAL) ×1 IMPLANT
EVACUATOR SILICONE 100CC (DRAIN) IMPLANT
GLOVE SS BIOGEL STRL SZ 7.5 (GLOVE) ×1 IMPLANT
GLOVE SUPERSENSE BIOGEL SZ 7.5 (GLOVE) ×2
GOWN STRL REUS W/ TWL LRG LVL3 (GOWN DISPOSABLE) ×3 IMPLANT
GOWN STRL REUS W/TWL LRG LVL3 (GOWN DISPOSABLE) ×9
KIT BASIN OR (CUSTOM PROCEDURE TRAY) ×3 IMPLANT
KIT SHUNT ARGYLE CAROTID ART 6 (VASCULAR PRODUCTS) IMPLANT
KIT TURNOVER KIT B (KITS) ×3 IMPLANT
NEEDLE 22X1 1/2 (OR ONLY) (NEEDLE) IMPLANT
NS IRRIG 1000ML POUR BTL (IV SOLUTION) ×6 IMPLANT
PACK CAROTID (CUSTOM PROCEDURE TRAY) ×3 IMPLANT
PAD ARMBOARD 7.5X6 YLW CONV (MISCELLANEOUS) ×6 IMPLANT
PATCH HEMASHIELD 8X75 (Vascular Products) ×2 IMPLANT
SHUNT CAROTID BYPASS 10 (VASCULAR PRODUCTS) ×2 IMPLANT
SHUNT CAROTID BYPASS 12FRX15.5 (VASCULAR PRODUCTS) IMPLANT
SUT ETHILON 3 0 PS 1 (SUTURE) IMPLANT
SUT PROLENE 6 0 CC (SUTURE) ×5 IMPLANT
SUT SILK 3 0 (SUTURE)
SUT SILK 3-0 18XBRD TIE 12 (SUTURE) IMPLANT
SUT VIC AB 3-0 SH 27 (SUTURE) ×6
SUT VIC AB 3-0 SH 27X BRD (SUTURE) ×2 IMPLANT
SUT VICRYL 4-0 PS2 18IN ABS (SUTURE) ×3 IMPLANT
SYR CONTROL 10ML LL (SYRINGE) IMPLANT
TOWEL GREEN STERILE (TOWEL DISPOSABLE) ×3 IMPLANT
WATER STERILE IRR 1000ML POUR (IV SOLUTION) ×3 IMPLANT

## 2018-08-11 NOTE — Progress Notes (Signed)
Pt transferred to 4E-09 from PACU via bed. Pt A&Ox4 neuro intact. VSS. Pt given CHG bath. Tele applied, CCMD notified. Pt oriented to call bell, room, and phone. Call bell within reach. Will continue to monitor.  Theophilus Kinds, RN

## 2018-08-11 NOTE — Anesthesia Procedure Notes (Signed)
Arterial Line Insertion Start/End6/07/2018 7:05 AM Performed by: Lanell Matar, CRNA, CRNA  Preanesthetic checklist: patient identified, IV checked, site marked, risks and benefits discussed, surgical consent, monitors and equipment checked, pre-op evaluation, timeout performed and anesthesia consent Lidocaine 1% used for infiltration and patient sedated Left, radial was placed Catheter size: 20 G Hand hygiene performed  and maximum sterile barriers used   Attempts: 1 Procedure performed without using ultrasound guided technique. Following insertion, Biopatch and dressing applied. Post procedure assessment: normal  Patient tolerated the procedure well with no immediate complications.

## 2018-08-11 NOTE — Interval H&P Note (Signed)
History and Physical Interval Note:  08/11/2018 7:19 AM  David Christian  has presented today for surgery, with the diagnosis of right carotid stenosis.  The various methods of treatment have been discussed with the patient and family. After consideration of risks, benefits and other options for treatment, the patient has consented to  Procedure(s): ENDARTERECTOMY CAROTID (Right) as a surgical intervention.  The patient's history has been reviewed, patient examined, no change in status, stable for surgery.  I have reviewed the patient's chart and labs.  Questions were answered to the patient's satisfaction.     [name]

## 2018-08-11 NOTE — Op Note (Signed)
° °  OPERATIVE REPORT  DATE OF SURGERY: 08/11/2018  PATIENT: David Christian, 43 y.o. male MRN: [id number]  DOB: [birth year]  PRE-OPERATIVE DIAGNOSIS: Right Carotid Stenosis, Asymptomatic  POST-OPERATIVE DIAGNOSIS:  Same  PROCEDURE:  Right Carotid Endarterectomy with Dacron Patch Angioplasty  SURGEON:  [name], M.D.  PHYSICIAN ASSISTANT: Clinton Gallant  ANESTHESIA:   general  EBL: Less than 200 ml  Total I/O In: 1000 [I.V.:1000] Out: 400 [Urine:200; Blood:200]  BLOOD ADMINISTERED: none  DRAINS: none   SPECIMEN: none  COUNTS CORRECT:  YES  PLAN OF CARE: Admit to inpatient   PATIENT DISPOSITION:  PACU - hemodynamically stable and neurologically intact.  PROCEDURE DETAILS: The patient was taken to the operating room placed in supine position.  General anesthesia was administered.  The neck was prepped and draped in the usual sterile fashion.  An incision was made anterior to the sternocleidomastoid and carried down through the platysma with electrocautery.  The sternocleidomastoid was reflected posteriorly and the carotid sheath was opened.  The facial vein was ligated with 2-0 silk ties and divided.  The common carotid artery was encircled with an umbilical tape and Rummel tourniquet.  The vagus nerve was identified and preserved.  Dissection was continued onto the carotid bifurcation.  The superior thyroid artery was encircled with a 2-0 silk Potts tie.  The external carotid was encircled with a blue vessel loop and the internal carotid was encircled with an umbilical tape and Rummel tourniquet.  The hypoglossal nerve was identified and preserved.  The patient was given systemic heparin and after adequate circulation time, the internal, external and common carotid arteries were occluded with vascular clamps.  The common carotid artery was opened with an 11 blade and extended  longitudinally with Potts scissors.  A 10 shunt was passed up the internal carotid and allowed to backbleed.   It was then passed down the common carotid where it was secured with Rummel tourniquet.  The endarterectomy was begun on the common carotid artery and the plaque was divided proximally with Potts scissors.  The endarterectomy was continued onto the bifurcation.  The external carotid was endarterectomized with an eversion technique and the internal carotid was endarterectomized in an open fashion.  Remaining atheromatous debris was removed from the endarterectomy plane.  A Finesse Hemashield Dacron patch was brought onto the field and was sewn as a patch angioplasty with a running 6-0 Prolene suture.  Prior to completion of the closure the shunt was removed and the usual flushing maneuvers were undertaken.  The anastomosis was completed and flow was restored first to the external and then the internal carotid artery.  Excellent flow characteristics were noted with hand-held Doppler in the internal and external carotid arteries.  The patient was given 50 mg of protamine to reverse the heparin.  The wounds were irrigated with saline.  Hemostasis was obtained with electrocautery.  The wounds were closed with 3-0 Vicryl to reapproximate the sternocleidomastoid over the carotid sheath.  The platysma was lysed with a running 3-0 Vicryl suture.  The skin was closed with a 4-0 subcuticular Vicryl stitch.  Dermabond was applied.  The patient was awakened neurologically intact in the operating room and transferred to the recovery room in stable condition   [name], M.D. 08/11/2018 10:38 AM

## 2018-08-11 NOTE — Transfer of Care (Signed)
Immediate Anesthesia Transfer of Care Note  Patient: David Christian  Procedure(s) Performed: right carotid ENDARTERECTOMY (Right Neck) Patch Angioplasty of right carotid artery using hemashield platinum finesse patch (Right Neck)  Patient Location: PACU  Anesthesia Type:General  Level of Consciousness: awake, alert  and oriented  Airway & Oxygen Therapy: Patient Spontanous Breathing and Patient connected to face mask oxygen  Post-op Assessment: Report given to RN, Post -op Vital signs reviewed and stable and Patient moving all extremities X 4  Post vital signs: Reviewed and stable  Last Vitals:  Vitals Value Taken Time  BP 117/72 08/11/2018 10:21 AM  Temp    Pulse 80 08/11/2018 10:22 AM  Resp 20 08/11/2018 10:22 AM  SpO2 98 % 08/11/2018 10:22 AM  Vitals shown include unvalidated device data.  Last Pain:  Vitals:   08/11/18 0603  TempSrc: Oral         Complications: No apparent anesthesia complications

## 2018-08-11 NOTE — Anesthesia Procedure Notes (Signed)
Procedure Name: Intubation Date/Time: 08/11/2018 7:45 AM Performed by: Gaylene Brooks, CRNA Pre-anesthesia Checklist: Patient identified, Emergency Drugs available, Suction available and Patient being monitored Patient Re-evaluated:Patient Re-evaluated prior to induction Oxygen Delivery Method: Circle System Utilized Preoxygenation: Pre-oxygenation with 100% oxygen Induction Type: IV induction and Rapid sequence Laryngoscope Size: Miller and 2 Grade View: Grade I Tube type: Oral Tube size: 7.5 mm Number of attempts: 1 Airway Equipment and Method: Stylet,  Oral airway and LTA kit utilized Placement Confirmation: ETT inserted through vocal cords under direct vision,  positive ETCO2 and breath sounds checked- equal and bilateral Secured at: 23 cm Tube secured with: Tape Dental Injury: Teeth and Oropharynx as per pre-operative assessment

## 2018-08-11 NOTE — Anesthesia Postprocedure Evaluation (Addendum)
Anesthesia Post Note  Patient: David Christian  Procedure(s) Performed: right carotid ENDARTERECTOMY (Right Neck) Patch Angioplasty of right carotid artery using hemashield platinum finesse patch (Right Neck)     Patient location during evaluation: PACU Anesthesia Type: General Level of consciousness: sedated Pain management: pain level controlled Vital Signs Assessment: post-procedure vital signs reviewed and stable Respiratory status: spontaneous breathing and respiratory function stable Cardiovascular status: stable Postop Assessment: no apparent nausea or vomiting Anesthetic complications: no    Last Vitals:  Vitals:   08/11/18 1120 08/11/18 1121  BP: 115/63   Pulse: 72 69  Resp: 11 17  Temp:    SpO2: 95% 92%    Last Pain:  Vitals:   08/11/18 1120  TempSrc:   PainSc: Asleep                 Liyah Higham DANIEL

## 2018-08-11 NOTE — Discharge Instructions (Signed)
   [facility]  Discharge Instructions   Carotid Endarterectomy (CEA)  Please refer to the following instructions for your post-procedure care. Your surgeon or physician assistant will discuss any changes with you.  Activity  You are encouraged to walk as much as you can. You can slowly return to normal activities but must avoid strenuous activity and heavy lifting until your doctor tell you it's OK. Avoid activities such as vacuuming or swinging a golf club. You can drive after one week if you are comfortable and you are no longer taking prescription pain medications. It is normal to feel tired for serval weeks after your surgery. It is also normal to have difficulty with sleep habits, eating, and bowel movements after surgery. These will go away with time.  Bathing/Showering  You may shower after you come home. Do not soak in a bathtub, hot tub, or swim until the incision heals completely.  Incision Care  Shower every day. Clean your incision with mild soap and water. Pat the area dry with a clean towel. You do not need a bandage unless otherwise instructed. Do not apply any ointments or creams to your incision. You may have skin glue on your incision. Do not peel it off. It will come off on its own in about one week. Your incision may feel thickened and raised for several weeks after your surgery. This is normal and the skin will soften over time. For Men Only: It's OK to shave around the incision but do not shave the incision itself for 2 weeks. It is common to have numbness under your chin that could last for several months.  Diet  Resume your normal diet. There are no special food restrictions following this procedure. A low fat/low cholesterol diet is recommended for all patients with vascular disease. In order to heal from your surgery, it is CRITICAL to get adequate nutrition. Your body requires vitamins, minerals, and protein. Vegetables are the best  source of vitamins and minerals. Vegetables also provide the perfect balance of protein. Processed food has little nutritional value, so try to avoid this.        Medications  Resume taking all of your medications unless your doctor or physician assistant tells you not to. If your incision is causing pain, you may take over-the- counter pain relievers such as acetaminophen (Tylenol). If you were prescribed a stronger pain medication, please be aware these medications can cause nausea and constipation. Prevent nausea by taking the medication with a snack or meal. Avoid constipation by drinking plenty of fluids and eating foods with a high amount of fiber, such as fruits, vegetables, and grains. Do not take Tylenol if you are taking prescription pain medications.  Follow Up  Our office will schedule a follow up appointment 2-3 weeks following discharge.  Please call us immediately for any of the following conditions  Increased pain, redness, drainage (pus) from your incision site. Fever of 101 degrees or higher. If you should develop stroke (slurred speech, difficulty swallowing, weakness on one side of your body, loss of vision) you should call 911 and go to the nearest emergency room.  Reduce your risk of vascular disease:  Stop smoking. If you would like help call [facility] at 1-800-QUIT-NOW (1-800-784-8669) or  at 336-586-4000. Manage your cholesterol Maintain a desired weight Control your diabetes Keep your blood pressure down  If you have any questions, please call the office at [phone].   

## 2018-08-12 LAB — BASIC METABOLIC PANEL
Anion gap: 8 (ref 5–15)
BUN: 16 mg/dL (ref 6–20)
CO2: 21 mmol/L — ABNORMAL LOW (ref 22–32)
Calcium: 8.4 mg/dL — ABNORMAL LOW (ref 8.9–10.3)
Chloride: 110 mmol/L (ref 98–111)
Creatinine, Ser: 0.86 mg/dL (ref 0.61–1.24)
GFR calc Af Amer: >60 mL/min (ref >60)
GFR calc non Af Amer: >60 mL/min (ref >60)
Glucose, Bld: 153 mg/dL — ABNORMAL HIGH (ref 70–99)
Potassium: 4.3 mmol/L (ref 3.5–5.1)
Sodium: 139 mmol/L (ref 135–145)

## 2018-08-12 LAB — CBC
HCT: 38.6 % — ABNORMAL LOW (ref 39.0–52.0)
Hemoglobin: 12.2 g/dL — ABNORMAL LOW (ref 13.0–17.0)
MCH: 26.6 pg (ref 26.0–34.0)
MCHC: 31.6 g/dL (ref 30.0–36.0)
MCV: 84.3 fL (ref 80.0–100.0)
Platelets: 262 10*3/uL (ref 150–400)
RBC: 4.58 MIL/uL (ref 4.22–5.81)
RDW: 14.9 % (ref 11.5–15.5)
WBC: 15.2 10*3/uL — ABNORMAL HIGH (ref 4.0–10.5)
nRBC: 0 % (ref 0.0–0.2)

## 2018-08-12 MED ORDER — OXYCODONE-ACETAMINOPHEN 5-325 MG PO TABS: 1.0000 | ORAL_TABLET | Freq: Four times a day (QID) | ORAL | 0 refills | Status: DC | PRN

## 2018-08-12 NOTE — Discharge Summary (Signed)
Discharge Summary     David ButcherMatthew D Christian Oct 13, 1975 43 y.o. male  [id number]  Admission Date: 08/11/2018  Discharge Date: 08/12/18  Physician: Larina EarthlyEarly, Todd F, MD  Admission Diagnosis: right carotid stenosis  Discharge Day services:   See progress note 08/12/2018 Physical Exam: Vitals:   08/12/18 0406 08/12/18 0809  BP: 105/60 113/73  Pulse: 68 79  Resp: 14 15  Temp: 97.6 F (36.4 C) 97.7 F (36.5 C)  SpO2: 98% 99%    Hospital Course:  The patient was admitted to the hospital and taken to the operating room on 08/11/2018 and underwent right carotid endarterectomy.  The pt tolerated the procedure well and was transported to the PACU in good condition.   By POD 1, the pt neuro status remains at baseline  The remainder of the hospital course consisted of increasing mobilization and increasing intake of solids without difficulty.  He will follow-up with Dr. early in 2 weeks.  He will be discharged with 2 to 3 days of narcotic pain medication for continued postoperative pain control.  He will be discharged to home this morning in stable condition.   Recent Labs    08/10/18 0928 08/12/18 0308  NA 137 139  K 3.8 4.3  CL 107 110  CO2 20* 21*  GLUCOSE 95 153*  BUN 14 16  CALCIUM 8.9 8.4*   Recent Labs    08/11/18 1402 08/12/18 0308  WBC 11.4* 15.2*  HGB 13.2 12.2*  HCT 41.5 38.6*  PLT 261 262   Recent Labs    08/10/18 0928  INR 1.2       Discharge Diagnosis:  right carotid stenosis  Secondary Diagnosis: Patient Active Problem List   Diagnosis Date Noted  . Carotid stenosis 08/11/2018  . Bilateral carotid artery stenosis: R 80-99%; L 40-59% 06/15/2018  . S/P CABG x 5 05/04/2018  . Left main coronary artery disease 05/03/2018  . Abnormal nuclear stress test: HIGH RISK - Anterior Ischemia. 04/29/2018  . Angina, class III (HCC) 04/29/2018  . Severe obesity (BMI 35.0-35.9 with comorbidity) (HCC) 04/28/2018  . Coronary artery disease involving native  coronary artery of native heart without angina pectoris 04/28/2018  . Mixed hyperlipidemia 03/05/2015  . Essential hypertension 08/21/2014  . Severe obesity (BMI 35.0-39.9) with comorbidity (HCC) 05/23/2013  . External hemorrhoids 05/23/2013  . OBSTRUCTIVE SLEEP APNEA 02/08/2007   Past Medical History:  Diagnosis Date  . Anginal pain (HCC)    pt. denies  . Coronary artery disease   . GERD (gastroesophageal reflux disease)   . Hyperlipidemia   . Hypertension   . Sleep apnea    uses CPAP    Allergies as of 08/12/2018   No Known Allergies     Medication List    TAKE these medications   acetaminophen 325 MG tablet Commonly known as:  TYLENOL Take 2 tablets (650 mg total) by mouth every 6 (six) hours as needed for mild pain.   aspirin 325 MG EC tablet Take 1 tablet (325 mg total) by mouth daily.   atorvastatin 80 MG tablet Commonly known as:  LIPITOR Take 1 tablet (80 mg total) by mouth every evening.   loratadine 10 MG tablet Commonly known as:  CLARITIN Take 10 mg by mouth daily.   metoprolol tartrate 25 MG tablet Commonly known as:  LOPRESSOR Take 1 tablet (25 mg total) by mouth 2 (two) times daily.   oxyCODONE-acetaminophen 5-325 MG tablet Commonly known as:  PERCOCET/ROXICET Take 1 tablet by mouth every 6 (six)  hours as needed for moderate pain.   sertraline 50 MG tablet Commonly known as:  ZOLOFT Take 50 mg by mouth daily.        Discharge Instructions:   [facility] Dis[facility] Instructions Carotid Endarterectomy (CEA)  Please refer to the following instructions for your post-procedure care. Your surgeon or physician assistant will discuss any changes with you.  Activity  You are encouraged to walk as much as you can. You can slowly return to normal activities but must avoid strenuous activity and heavy lifting until your doctor tell you it's OK. Avoid activities such as vacuuming or swinging a golf club. You can drive  after one week if you are comfortable and you are no longer taking prescription pain medications. It is normal to feel tired for serval weeks after your surgery. It is also normal to have difficulty with sleep habits, eating, and bowel movements after surgery. These will go away with time.  Bathing/Showering  You may shower after you come home. Do not soak in a bathtub, hot tub, or swim until the incision heals completely.  Incision Care  Shower every day. Clean your incision with mild soap and water. Pat the area dry with a clean towel. You do not need a bandage unless otherwise instructed. Do not apply any ointments or creams to your incision. You may have skin glue on your incision. Do not peel it off. It will come off on its own in about one week. Your incision may feel thickened and raised for several weeks after your surgery. This is normal and the skin will soften over time. For Men Only: It's OK to shave around the incision but do not shave the incision itself for 2 weeks. It is common to have numbness under your chin that could last for several months.  Diet  Resume your normal diet. There are no special food restrictions following this procedure. A low fat/low cholesterol diet is recommended for all patients with vascular disease. In order to heal from your surgery, it is CRITICAL to get adequate nutrition. Your body requires vitamins, minerals, and protein. Vegetables are the best source of vitamins and minerals. Vegetables also provide the perfect balance of protein. Processed food has little nutritional value, so try to avoid this.  Medications  Resume taking all of your medications unless your doctor or physician assistant tells you not to.  If your incision is causing pain, you may take over-the- counter pain relievers such as acetaminophen (Tylenol). If you were prescribed a stronger pain medication, please be aware these medications can cause nausea and constipation.  Prevent nausea  by taking the medication with a snack or meal. Avoid constipation by drinking plenty of fluids and eating foods with a high amount of fiber, such as fruits, vegetables, and grains. Do not take Tylenol if you are taking prescription pain medications.  Follow Up  Our office will schedule a follow up appointment 2-3 weeks following discharge.  Please call us immediately for any of the following conditions  Increased pain, redness, drainage (pus) from your incision site. Fever of 101 degrees or higher. If you should develop stroke (slurred speech, difficulty swallowing, weakness on one side of your body, loss of vision) you should call 911 and go to the nearest emergency room.  Reduce your risk of vascular disease:  Stop smoking. If you would like help call [facility] at 1-800-QUIT-NOW ((276) 093-96431-830-446-5395) or [place] at [phone]. Manage your cholesterol Maintain a desired weight Control  your diabetes Keep your blood pressure down  If you have any questions, please call the office at [phone].  Disposition: home  Patient's condition: is Good  Follow up: 1. Dr. Donnetta Hutching in 2 weeks.   Dagoberto Ligas, PA-C [facility] [phone]   --- For [facility] Registry use ---   Modified Rankin score at D/C (0-6): 0  IV medication needed for:  1. Hypertension: No 2. Hypotension: No  Post-op Complications: No  1. Post-op CVA or TIA: No  If yes: Event classification (right eye, left eye, right cortical, left cortical, verterobasilar, other):   If yes: Timing of event (intra-op, <6 hrs post-op, >=6 hrs post-op, unknown):   2. CN injury: No  If yes: CN  injuried   3. Myocardial infarction: No  If yes: Dx by (EKG or clinical, Troponin):   4.  CHF: No  5.  Dysrhythmia (new): No  6. Wound infection: No  7. Reperfusion symptoms: No  8. Return to OR: No  If yes: return to OR for (bleeding, neurologic, other CEA incision, other):   Discharge medications:  Statin use:  Yes ASA use:  Yes   Beta blocker use:  Yes ACE-Inhibitor use:  No  ARB use:  No CCB use: No P2Y12 Antagonist use: No, [ ]  Plavix, [ ]  Plasugrel, [ ]  Ticlopinine, [ ]  Ticagrelor, [ ]  Other, [ ]  No for medical reason, [ ]  Non-compliant, [ ]  Not-indicated Anti-coagulant use:  No, [ ]  Warfarin, [ ]  Rivaroxaban, [ ]  Dabigatran,

## 2018-08-12 NOTE — Progress Notes (Addendum)
  Progress Note    08/12/2018 8:33 AM 1 Day Post-Op  Subjective:  Denies stroke like symptoms including slurring speech, changes in vision, or one sided weakness   Vitals:   08/12/18 0406 08/12/18 0809  BP: 105/60 113/73  Pulse: 68 79  Resp: 14 15  Temp: 97.6 F (36.4 C) 97.7 F (36.5 C)  SpO2: 98% 99%   Physical Exam: Lungs:  Non labored Incisions:  Incision c/d/i, some local swelling Extremities:  Moving all extremities well Neurologic: CN grossly intact  CBC    Component Value Date/Time   WBC 15.2 (H) 08/12/2018 0308   RBC 4.58 08/12/2018 0308   HGB 12.2 (L) 08/12/2018 0308   HGB 15.2 04/28/2018 1408   HCT 38.6 (L) 08/12/2018 0308   HCT 46.5 04/28/2018 1408   PLT 262 08/12/2018 0308   PLT 341 04/28/2018 1408   MCV 84.3 08/12/2018 0308   MCV 88 04/28/2018 1408   MCH 26.6 08/12/2018 0308   MCHC 31.6 08/12/2018 0308   RDW 14.9 08/12/2018 0308   RDW 13.7 04/28/2018 1408    BMET    Component Value Date/Time   NA 139 08/12/2018 0308   NA 137 04/28/2018 1408   K 4.3 08/12/2018 0308   CL 110 08/12/2018 0308   CO2 21 (L) 08/12/2018 0308   GLUCOSE 153 (H) 08/12/2018 0308   BUN 16 08/12/2018 0308   BUN 11 04/28/2018 1408   CREATININE 0.86 08/12/2018 0308   CALCIUM 8.4 (L) 08/12/2018 0308   GFRNONAA >60 08/12/2018 0308   GFRAA >60 08/12/2018 0308    INR    Component Value Date/Time   INR 1.2 08/10/2018 0928     Intake/Output Summary (Last 24 hours) at 08/12/2018 0833 Last data filed at 08/12/2018 0814 Gross per 24 hour  Intake 3201.46 ml  Output 4005 ml  Net -803.54 ml     Assessment/Plan:  43 y.o. male is s/p R CEA for asymptomatic stenosis 1 Day Post-Op   Neuro exam at baseline Neck incision unremarkable Ok for discharge home today Follow up with Dr. Donnetta Hutching in 2-3 weeks   Dagoberto Ligas, PA-C [facility] [phone] 08/12/2018 8:33 AM  I have independently interviewed and examined patient and agree with PA assessment and  plan above.   Seerat Peaden C. Donzetta Matters, MD [facility] [place] Office: [phone] Pager: [phone]

## 2018-08-14 ENCOUNTER — Encounter (HOSPITAL_COMMUNITY): Payer: Self-pay | Admitting: Vascular Surgery

## 2018-08-29 ENCOUNTER — Other Ambulatory Visit: Payer: Self-pay

## 2018-08-29 ENCOUNTER — Ambulatory Visit (INDEPENDENT_AMBULATORY_CARE_PROVIDER_SITE_OTHER): Payer: Self-pay | Admitting: Vascular Surgery

## 2018-08-29 ENCOUNTER — Encounter: Payer: Self-pay | Admitting: Vascular Surgery

## 2018-08-29 VITALS — BP 119/77 | HR 68 | Temp 97.9°F | Resp 20 | Ht 71.0 in | Wt 260.0 lb

## 2018-08-29 DIAGNOSIS — I6521 Occlusion and stenosis of right carotid artery: Secondary | ICD-10-CM

## 2018-08-29 NOTE — Progress Notes (Signed)
   Patient name: David Christian MRN: [id number] DOB: 1975-05-02 Sex: male  REASON FOR VISIT: Follow-up right carotid endarterectomy on 08/11/2018  HPI: David Christian is a 43 y.o. male here today for follow-up.  He underwent endarterectomy for asymptomatic high-grade right stenosis.  This was discovered at the time of his pre-coronary bypass screen.  He underwent uneventful coronary bypass and then return for a carotid surgery.  He does report occasional headache in the right side of his head and I explained that this is related to nerve irritation and will be self-limited.  He does have the typical peri-incisional numbness.  He has had no focal neurologic deficits.  Current Outpatient Medications  Medication Sig Dispense Refill  . acetaminophen (TYLENOL) 325 MG tablet Take 2 tablets (650 mg total) by mouth every 6 (six) hours as needed for mild pain.    [name] aspirin EC 325 MG EC tablet Take 1 tablet (325 mg total) by mouth daily.    [name] atorvastatin (LIPITOR) 80 MG tablet Take 1 tablet (80 mg total) by mouth every evening. 30 tablet 5  . loratadine (CLARITIN) 10 MG tablet Take 10 mg by mouth daily.    . metoprolol tartrate (LOPRESSOR) 25 MG tablet Take 1 tablet (25 mg total) by mouth 2 (two) times daily. 60 tablet 5  . sertraline (ZOLOFT) 50 MG tablet Take 50 mg by mouth daily.      No current facility-administered medications for this visit.      PHYSICAL EXAM: Vitals:   08/29/18 1546 08/29/18 1549  BP: 109/68 119/77  Pulse: 68   Resp: 20   Temp: 97.9 F (36.6 C)   SpO2: 98%   Weight: 117.9 kg   Height: 5\' 11"  (1.803 m)     GENERAL: The patient is a well-nourished male, in no acute distress. The vital signs are documented above. His right neck incision is well-healed.  He has no bruits.  MEDICAL ISSUES: Stable status post right carotid endarterectomy for severe asymptomatic disease.  We will see him again in 9 months with repeat carotid duplex.  He  does report episodes of tingling and numb sensation in both upper extremities that is somewhat positional depending on whether he is sitting in a recliner or a straight back chair.  Discussed this may be cervical.  He is asking for referral and we will refer him to [facility] for evaluation and potential treatment.   David Posner, MD FACS [facility] Tel [phone] Pager [phone]

## 2018-09-26 ENCOUNTER — Ambulatory Visit: Payer: Managed Care, Other (non HMO) | Admitting: Neurology

## 2018-09-26 ENCOUNTER — Encounter: Payer: Self-pay | Admitting: Neurology

## 2018-09-26 ENCOUNTER — Telehealth: Payer: Self-pay | Admitting: Neurology

## 2018-09-26 ENCOUNTER — Other Ambulatory Visit: Payer: Self-pay

## 2018-09-26 VITALS — BP 112/75 | HR 68 | Temp 97.7°F | Ht 71.0 in | Wt 263.5 lb

## 2018-09-26 DIAGNOSIS — M542 Cervicalgia: Secondary | ICD-10-CM | POA: Diagnosis not present

## 2018-09-26 DIAGNOSIS — M79602 Pain in left arm: Secondary | ICD-10-CM

## 2018-09-26 DIAGNOSIS — R202 Paresthesia of skin: Secondary | ICD-10-CM

## 2018-09-26 DIAGNOSIS — M79601 Pain in right arm: Secondary | ICD-10-CM | POA: Diagnosis not present

## 2018-09-26 NOTE — Telephone Encounter (Signed)
Patient was seen today. At check-out pt expressed concerns about continuing his disability. Patient states he has had it and it ran out around the time he began experiencing issues with his hands. We scheduled his NCV/EMG for next available in September, and I advised that our office will call to schedule his MRI. I told patient that I would sent a message to Dr. Rhea Belton nurse about continuing disability and advised that Sharyn Lull RN will call to discuss with pt.

## 2018-09-26 NOTE — Progress Notes (Signed)
PATIENT: David Christian DOB: [birth year]  Chief Complaint  Patient presents with  . Cervical Spine Syndrome    Reports neck pain along with radiating numbness/tingling/burning into bilateral arms.  [name] PCP    Slatosky, Marshall Cork., MD  . Vascular Surgeon    Early, Arvilla Meres, MD (referring MD)     HISTORICAL  David Christian is 43 year old male, seen in request by my care physician Dr. Debbora Presto and vascular surgeon Dr. Sherren Mocha Early for evaluation of bilateral hands numbness initial evaluation was on September 26, 2018.  I have reviewed and summarized the referring note from the referring physician.  He had past medical history of hypertension, hyperlipidemia, coronary artery disease, bypass surgery on May 05, 2018, during work-up, he was found to have asymptomatic right side high-grade carotid artery stenosis, he had right carotid endarterectomy by Dr. Sherren Mocha early on August 11, 2018.  Around May 2020, without clear triggers, he began to notice intermittent bilateral hands numbness tingling, involving first 3 fingers, initially it was intermittent, now is happens more frequent, he denies muscle weakness, he has to shake his hand to get the sensation back, he does have neck pain, radiating pain to bilateral shoulder, and bilateral arm.  He denies gait abnormality, denies bilateral feet paresthesia   Laboratory evaluations BMP showed elevated glucose 153 creatinine of 0.86, CBC hemoglobin of 12.2,   REVIEW OF SYSTEMS: Full 14 system review of systems performed and notable only for as above All other review of systems were negative.  ALLERGIES: No Known Allergies  HOME MEDICATIONS: Current Outpatient Medications  Medication Sig Dispense Refill  . acetaminophen (TYLENOL) 325 MG tablet Take 2 tablets (650 mg total) by mouth every 6 (six) hours as needed for mild pain.    [name] aspirin EC 325 MG EC tablet Take 1 tablet (325 mg total) by mouth daily.    [name] atorvastatin (LIPITOR) 80 MG tablet Take 1  tablet (80 mg total) by mouth every evening. 30 tablet 5  . loratadine (CLARITIN) 10 MG tablet Take 10 mg by mouth daily.    . metoprolol tartrate (LOPRESSOR) 25 MG tablet Take 1 tablet (25 mg total) by mouth 2 (two) times daily. 60 tablet 5  . sertraline (ZOLOFT) 50 MG tablet Take 50 mg by mouth daily.      No current facility-administered medications for this visit.     PAST MEDICAL HISTORY: Past Medical History:  Diagnosis Date  . Anginal pain ([place])    pt. denies  . Coronary artery disease   . GERD (gastroesophageal reflux disease)   . Hyperlipidemia   . Hypertension   . Neck pain   . Sleep apnea    uses CPAP    PAST SURGICAL HISTORY: Past Surgical History:  Procedure Laterality Date  . CORONARY ARTERY BYPASS GRAFT N/A 05/04/2018   Procedure: CORONARY ARTERY BYPASS GRAFTING (CABG) x5 using right greater saphenous vein harvested endoscopically and bilateral left internal mammary arteries;  Surgeon: Melrose Nakayama, MD;  Location: [facility];  Service: Open Heart Surgery;  Laterality: N/A;  possible bilateral IMA  . ELBOW SURGERY    . ENDARTERECTOMY Right 08/11/2018   Procedure: right carotid ENDARTERECTOMY;  Surgeon: Rosetta Posner, MD;  Location: [facility];  Service: Vascular;  Laterality: Right;  . HAND SURGERY    . LEFT HEART CATH AND CORONARY ANGIOGRAPHY N/A 05/03/2018   Procedure: LEFT HEART CATH AND CORONARY ANGIOGRAPHY;  Surgeon: Leonie Man, MD;  Location: [facility];  Service: Cardiovascular;  Laterality: N/A;  . MASS EXCISION Right 10/31/2014   Procedure: EXCISION ANTERIOR NECK & RIGHT SUBMANDIBULAR CYSTS WITH COMPLEX CLOSURES;  Surgeon: Vernie MurdersPaul Juengel, MD;  [facility];  Service: ENT;  Laterality: Right;  CPAP  . PATCH ANGIOPLASTY Right 08/11/2018   Procedure: Patch Angioplasty of right carotid artery using hemashield platinum finesse patch;  Surgeon: Larina EarthlyEarly, Todd F, MD;  Location: [facility];  Service: Vascular;  Laterality: Right;  . TEE WITHOUT  CARDIOVERSION N/A 05/04/2018   Procedure: TRANSESOPHAGEAL ECHOCARDIOGRAM (TEE);  Surgeon: [name], [name], MD;  [facility];  Service: Open Heart Surgery;  Laterality: N/A;    FAMILY HISTORY: Family History  Problem Relation Age of Onset  . Healthy Mother   . Healthy Father   . Arthritis Maternal Grandfather   . Arthritis Paternal Grandmother   . Diabetes Neg Hx   . Heart disease Neg Hx   . Stroke Neg Hx     SOCIAL HISTORY: Social History   Socioeconomic History  . Marital status: Married    Spouse name: Not on file  . Number of children: 5  . Years of education: some college  . Highest education level: Not on file  Occupational History  . Occupation: Development worker, [employer] programming  Social Needs  . Financial resource strain: Not on file  . Food insecurity    Worry: Not on file    Inability: Not on file  . Transportation needs    Medical: Not on file    Non-medical: Not on file  Tobacco Use  . Smoking status: Former Smoker    Years: 6.00    Quit date: 08/07/1999    Years since quitting: 19.1  . Smokeless tobacco: Former NeurosurgeonUser    Types: Chew    Quit date: 08/09/2017  Substance and Sexual Activity  . Alcohol use: Yes    Alcohol/week: 1.0 standard drinks    Types: 1 Cans of beer per week    Comment: rare  . Drug use: No  . Sexual activity: Not on file  Lifestyle  . Physical activity    Days per week: Not on file    Minutes per session: Not on file  . Stress: Not on file  Relationships  . Social Musicianconnections    Talks on phone: Not on file    Gets together: Not on file    Attends religious service: Not on file    Active member of club or organization: Not on file    Attends meetings of clubs or organizations: Not on file    Relationship status: Not on file  . Intimate partner violence    Fear of current or ex partner: Not on file    Emotionally abused: Not on file    Physically abused: Not on file    Forced sexual activity: Not on file  Other Topics Concern  . Not  on file  Social History Narrative   Lives at home with wife and family.   Right-handed.   Rare caffeine.     PHYSICAL EXAM   Vitals:   09/26/18 0806  BP: 112/75  Pulse: 68  Temp: 97.7 F (36.5 C)  Weight: 263 lb 8 oz (119.5 kg)  Height: 5\' 11"  (1.803 m)    Not recorded      Body mass index is 36.75 kg/m.  PHYSICAL EXAMNIATION:  Gen: NAD, conversant, well nourised, obese, well groomed  Cardiovascular: Regular rate rhythm, no peripheral edema, warm, nontender. Eyes: Conjunctivae clear without exudates or hemorrhage Neck: Supple, no carotid bruits. Pulmonary: Clear to auscultation bilaterally   NEUROLOGICAL EXAM:  MENTAL STATUS: Speech:    Speech is normal; fluent and spontaneous with normal comprehension.  Cognition:     Orientation to time, place and person     Normal recent and remote memory     Normal Attention span and concentration     Normal Language, naming, repeating,spontaneous speech     Fund of knowledge   CRANIAL NERVES: CN II: Visual fields are full to confrontation.  Pupils are round equal and briskly reactive to light. CN III, IV, VI: extraocular movement are normal. No ptosis. CN V: Facial sensation is intact to pinprick in all 3 divisions bilaterally. Corneal responses are intact.  CN VII: Face is symmetric with normal eye closure and smile. CN VIII: Hearing is normal to rubbing fingers CN IX, X: Palate elevates symmetrically. Phonation is normal. CN XI: Head turning and shoulder shrug are intact CN XII: Tongue is midline with normal movements and no atrophy.  MOTOR: There is no pronator drift of out-stretched arms. Muscle bulk and tone are normal. Muscle strength is normal.  REFLEXES: Reflexes are 2+ and symmetric at the biceps, triceps, knees, and ankles. Plantar responses are flexor.  SENSORY: Intact to light touch, pinprick, positional sensation and vibratory sensation are intact in fingers and toes.  COORDINATION:  Rapid alternating movements and fine finger movements are intact. There is no dysmetria on finger-to-nose and heel-knee-shin.    GAIT/STANCE: Posture is normal. Gait is steady with normal steps, base, arm swing, and turning. Heel and toe walking are normal. Tandem gait is normal.  Romberg is absent.   DIAGNOSTIC DATA (LABS, IMAGING, TESTING) - I reviewed patient records, labs, notes, testing and imaging myself where available.   ASSESSMENT AND PLAN  Allayne ButcherMatthew D Whitson is a 43 y.o. male   Bilateral upper extremity paresthesia  Differentiation diagnosis including bilateral carpal tunnel syndrome versus cervical radiculopathy  EMG nerve conduction study  MRI of cervical spine  Wrist splint, as needed NSAIDs   Levert FeinsteinYijun Sheryll Dymek, M.D. Ph.D.  [facility] [address], [address] [place], [place] [address] Ph: [phone] Fax: [phone]  CC: Referring Provider

## 2018-09-26 NOTE — Telephone Encounter (Addendum)
The patient states he has been out on disability due to having bypass surgery.  He is now back to working around 20 hours per week.  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  States that past 15-20 hours, his hands become more painful and tired which causes difficulty typing/using mouse.  He would like a temporary reduction in hours while undergoing his work up with Dr. Krista Blue.  Per Dr. Windell Moulding, she will allow this up until she has reviewed his cervical MRI.  He will discuss the needed paperwork with his employer.

## 2018-09-26 NOTE — Telephone Encounter (Signed)
[employer] order sent to [facility]. They will obtain the auth and reach out to the patient to schedule.  

## 2018-10-02 NOTE — Telephone Encounter (Signed)
David Christian: [id number] (exp. 09/28/18 to 03/27/19) patient is scheduled at GI for 10/05/18.

## 2018-10-05 ENCOUNTER — Ambulatory Visit
Admission: RE | Admit: 2018-10-05 | Discharge: 2018-10-05 | Disposition: A | Discharge status: Home / Self Care | Payer: Managed Care, Other (non HMO) | Source: Ambulatory Visit | Attending: Neurology | Admitting: Neurology

## 2018-10-05 ENCOUNTER — Other Ambulatory Visit: Payer: Self-pay

## 2018-10-05 DIAGNOSIS — M542 Cervicalgia: Secondary | ICD-10-CM

## 2018-10-05 DIAGNOSIS — R202 Paresthesia of skin: Secondary | ICD-10-CM

## 2018-10-05 DIAGNOSIS — M79601 Pain in right arm: Secondary | ICD-10-CM

## 2018-10-05 DIAGNOSIS — M79602 Pain in left arm: Secondary | ICD-10-CM

## 2018-10-07 LAB — LIPID PANEL
Chol/HDL Ratio: 5.3 ratio — ABNORMAL HIGH (ref 0.0–5.0)
Cholesterol, Total: 186 mg/dL (ref 100–199)
HDL: 35 mg/dL — ABNORMAL LOW (ref >39)
LDL Calculated: 119 mg/dL — ABNORMAL HIGH (ref 0–99)
Triglycerides: 162 mg/dL — ABNORMAL HIGH (ref 0–149)
VLDL Cholesterol Cal: 32 mg/dL (ref 5–40)

## 2018-10-07 LAB — COMPREHENSIVE METABOLIC PANEL
ALT: 11 IU/L (ref 0–44)
AST: 22 IU/L (ref 0–40)
Albumin/Globulin Ratio: 1.8 (ref 1.2–2.2)
Albumin: 4.2 g/dL (ref 4.0–5.0)
Alkaline Phosphatase: 86 IU/L (ref 39–117)
BUN/Creatinine Ratio: 20 (ref 9–20)
BUN: 21 mg/dL (ref 6–24)
Bilirubin Total: 0.6 mg/dL (ref 0.0–1.2)
CO2: 21 mmol/L (ref 20–29)
Calcium: 9.1 mg/dL (ref 8.7–10.2)
Chloride: 101 mmol/L (ref 96–106)
Creatinine, Ser: 1.06 mg/dL (ref 0.76–1.27)
GFR calc Af Amer: 100 mL/min/{1.73_m2} (ref >59)
GFR calc non Af Amer: 87 mL/min/{1.73_m2} (ref >59)
Globulin, Total: 2.3 g/dL (ref 1.5–4.5)
Glucose: 87 mg/dL (ref 65–99)
Potassium: 4.7 mmol/L (ref 3.5–5.2)
Sodium: 138 mmol/L (ref 134–144)
Total Protein: 6.5 g/dL (ref 6.0–8.5)

## 2018-10-09 ENCOUNTER — Telehealth: Payer: Self-pay | Admitting: Neurology

## 2018-10-09 NOTE — Telephone Encounter (Signed)
Called the patient, there was no answer. LVM of the MRI finding. Advised the patient if he has questions to please contact the office.

## 2018-10-09 NOTE — Telephone Encounter (Signed)
Patient returned call and I was able to review the MRI reuslts with him. Advised the patient that there was degenerative changes in the cervical neck region but nothing that indicated nerve compression or spinal stenosis. Advised the patient to keep upcoming apt with NCV and EMG. Pt verbalized understanding. Pt had no questions at this time but was encouraged to call back if questions arise.

## 2018-10-09 NOTE — Telephone Encounter (Signed)
Please call patient, MRI of cervical spine showed evidence of degenerative changes, there is no evidence of canal or foraminal stenosis    IMPRESSION: Abnormal MRI scan of the cervical spine showing mild disc degenerative changes at C5-6 and C6-7 with mild right-sided foraminal narrowing at C5-6 but no definite compression.

## 2018-10-10 ENCOUNTER — Telehealth: Payer: Self-pay

## 2018-10-10 NOTE — Telephone Encounter (Signed)
-----   Message from Ricci Barker, RN sent at 10/09/2018  2:16 PM EDT ----- Regarding: Repatha Hello,  Dr. Sallyanne Kuster would like for this patient to be started on Repatha with a goal of LDL less than 70. The patient is aware.  Thank you, Lattie Haw

## 2018-10-10 NOTE — Telephone Encounter (Signed)
This encounter was created in error - please disregard.

## 2018-10-10 NOTE — Telephone Encounter (Signed)
Called and spoke w/pt regarding repatha and needing to see a pharmd and the pt says that he would like to speak w/pharmd at his 10/17/18 appt w/dr C and kristin alvstad advised me to tell the pt that is ok and I will submit a new start pa for repatha sureclick

## 2018-10-10 NOTE — Telephone Encounter (Signed)
-----   Message from Lisa Y Richardson, RN sent at 10/09/2018  2:16 PM EDT ----- Regarding: Repatha Hello,  Dr. Croitoru would like for this patient to be started on Repatha with a goal of LDL less than 70. The patient is aware.  Thank you, Lisa  

## 2018-10-17 ENCOUNTER — Other Ambulatory Visit: Payer: Self-pay

## 2018-10-17 ENCOUNTER — Ambulatory Visit (INDEPENDENT_AMBULATORY_CARE_PROVIDER_SITE_OTHER): Payer: Managed Care, Other (non HMO) | Admitting: Cardiovascular Disease

## 2018-10-17 VITALS — BP 111/71 | HR 59 | Ht 72.0 in | Wt 261.4 lb

## 2018-10-17 DIAGNOSIS — I10 Essential (primary) hypertension: Secondary | ICD-10-CM

## 2018-10-17 DIAGNOSIS — G4733 Obstructive sleep apnea (adult) (pediatric): Secondary | ICD-10-CM

## 2018-10-17 DIAGNOSIS — M79602 Pain in left arm: Secondary | ICD-10-CM

## 2018-10-17 DIAGNOSIS — R202 Paresthesia of skin: Secondary | ICD-10-CM

## 2018-10-17 DIAGNOSIS — E7801 Familial hypercholesterolemia: Secondary | ICD-10-CM | POA: Insufficient documentation

## 2018-10-17 DIAGNOSIS — I6523 Occlusion and stenosis of bilateral carotid arteries: Secondary | ICD-10-CM

## 2018-10-17 DIAGNOSIS — M79601 Pain in right arm: Secondary | ICD-10-CM

## 2018-10-17 DIAGNOSIS — I25810 Atherosclerosis of coronary artery bypass graft(s) without angina pectoris: Secondary | ICD-10-CM

## 2018-10-17 MED ORDER — ASPIRIN EC 81 MG PO TBEC: 81.0000 mg | DELAYED_RELEASE_TABLET | Freq: Every day | ORAL | Status: DC

## 2018-10-17 NOTE — Progress Notes (Signed)
Cardiology Office Note:    Date:  10/17/2018   ID:  David Christian, DOB Jul 08, 1975, MRN [id number]  PCP:  Enid Skeens., MD  Cardiologist:  Sanda Klein, MD  Electrophysiologist:  None   Referring MD: Enid Skeens., MD   Chief Complaint  Patient presents with  . Coronary Artery Disease    f/u after CABG  Abnormal nuclear stress test  History of Present Illness:    David Christian is a 43 y.o. male with a hx of obesity, hyperlipidemia, hypertension, obstructive sleep apnea on CPAP, remote smoker who has had a couple of years of exertional chest pain (typical retrosternal chest tightness and exertional dyspnea), symptoms different from GERD ("heartburn"), found to have multivessel CAD by cardiac catheterization in February 2020 (left main 75% proximal LAD 99% ostial RCA 50% posterior lateral branch 80%) and asymptomatic right carotid stenosis, subsequently undergoing CABG x5 (06/02/2018, Dr. Roxan Hockey LIMA to LAD, RIMA to OM1, SVG to diagonal, sequential SVG to distal RCA and posterior lateral branch) followed by carotid endarterectomy on August 11, 2018 (Dr. Sherren Mocha Early).  His exercise capacity has improved substantially following his bypass surgery and he no longer has exertional chest discomfort or dyspnea.  He denies palpitations, syncope, claudication or lower extremity edema or erectile dysfunction.    He has developed waxing and waning neurological complaints in both hands.  He describes this mostly as tingling burning paresthesias as if his arm is waking up after being asleep.  At times he also has weakened grip.  The complaint sometimes is predominantly in the radial nerve territory, other times in the median or ulnar nerve distribution.  Usually both hands are involved equally, but asymmetrical symptoms have also been present.  Such complaints have been present ever since March, before he underwent carotid surgery.  An MRI of the cervical spine performed on October 05, 2018,  showing mild disc degenerative changes at C5-C6 and C6-C7 with mild right-sided foraminal narrowing at C5-C6 but no definite compression  He quit smoking about 7 years ago after smoking roughly a pack a day for about 15 years.  Still uses tobacco dip.  He has severely elevated LDL cholesterol even on combination atorvastatin and ezetimibe.  Recent lipid profile is not available for review.  He has well treated systemic hypertension and is 100% compliant with CPAP for obstructive sleep apnea.  He denies daytime hypersomnolence.  He does not have diabetes and does not have a family history of premature coronary disease.  Both of his parents are sti ll alive and healthy.  Past Medical History:  Diagnosis Date  . Anginal pain ([place])    pt. denies  . Coronary artery disease   . GERD (gastroesophageal reflux disease)   . Hyperlipidemia   . Hypertension   . Neck pain   . Sleep apnea    uses CPAP    Past Surgical History:  Procedure Laterality Date  . CORONARY ARTERY BYPASS GRAFT N/A 05/04/2018   Procedure: CORONARY ARTERY BYPASS GRAFTING (CABG) x5 using right greater saphenous vein harvested endoscopically and bilateral left internal mammary arteries;  Surgeon: Melrose Nakayama, MD;  Location: [facility];  Service: Open Heart Surgery;  Laterality: N/A;  possible bilateral IMA  . ELBOW SURGERY    . ENDARTERECTOMY Right 08/11/2018   Procedure: right carotid ENDARTERECTOMY;  Surgeon: Rosetta Posner, MD;  Location: [facility];  Service: Vascular;  Laterality: Right;  . HAND SURGERY    . LEFT HEART CATH AND CORONARY ANGIOGRAPHY  N/A 05/03/2018   Procedure: LEFT HEART CATH AND CORONARY ANGIOGRAPHY;  Surgeon: Marykay LexHarding, David W, MD;  [facility];  Service: Cardiovascular;  Laterality: N/A;  . MASS EXCISION Right 10/31/2014   Procedure: EXCISION ANTERIOR NECK & RIGHT SUBMANDIBULAR CYSTS WITH COMPLEX CLOSURES;  Surgeon: Vernie MurdersPaul Juengel, MD;  [facility];  Service: ENT;  Laterality:  Right;  CPAP  . PATCH ANGIOPLASTY Right 08/11/2018   Procedure: Patch Angioplasty of right carotid artery using hemashield platinum finesse patch;  Surgeon: Larina EarthlyEarly, Todd F, MD;  Location: [facility];  Service: Vascular;  Laterality: Right;  . TEE WITHOUT CARDIOVERSION N/A 05/04/2018   Procedure: TRANSESOPHAGEAL ECHOCARDIOGRAM (TEE);  Surgeon: [name], [name], MD;  [facility];  Service: Open Heart Surgery;  Laterality: N/A;    Current Medications: Current Meds  Medication Sig  . acetaminophen (TYLENOL) 325 MG tablet Take 2 tablets (650 mg total) by mouth every 6 (six) hours as needed for mild pain.  [name]. aspirin 81 MG tablet Take 1 tablet (81 mg total) by mouth daily.  [name]. atorvastatin (LIPITOR) 80 MG tablet Take 1 tablet (80 mg total) by mouth every evening.  . loratadine (CLARITIN) 10 MG tablet Take 10 mg by mouth daily.  . metoprolol tartrate (LOPRESSOR) 25 MG tablet Take 1 tablet (25 mg total) by mouth 2 (two) times daily.  . sertraline (ZOLOFT) 50 MG tablet Take 50 mg by mouth daily.   . [DISCONTINUED] aspirin EC 325 MG EC tablet Take 1 tablet (325 mg total) by mouth daily.     Allergies:   Patient has no known allergies.   Social History   Socioeconomic History  . Marital status: Married    Spouse name: Not on file  . Number of children: 5  . Years of education: some college  . Highest education level: Not on file  Occupational History  . Occupation: Development worker, [employer] programming  Social Needs  . Financial resource strain: Not on file  . Food insecurity    Worry: Not on file    Inability: Not on file  . Transportation needs    Medical: Not on file    Non-medical: Not on file  Tobacco Use  . Smoking status: Former Smoker    Years: 6.00    Quit date: 08/07/1999    Years since quitting: 19.2  . Smokeless tobacco: Former NeurosurgeonUser    Types: Chew    Quit date: 08/09/2017  Substance and Sexual Activity  . Alcohol use: Yes    Alcohol/week: 1.0 standard drinks    Types: 1 Cans of beer per week     Comment: rare  . Drug use: No  . Sexual activity: Not on file  Lifestyle  . Physical activity    Days per week: Not on file    Minutes per session: Not on file  . Stress: Not on file  Relationships  . Social Musicianconnections    Talks on phone: Not on file    Gets together: Not on file    Attends religious service: Not on file    Active member of club or organization: Not on file    Attends meetings of clubs or organizations: Not on file    Relationship status: Not on file  Other Topics Concern  . Not on file  Social History Narrative   Lives at home with wife and family.   Right-handed.   Rare caffeine.     Family History: The patient's family history includes Arthritis in his  maternal grandfather and paternal grandmother; Healthy in his father and mother. There is no history of Diabetes, Heart disease, or Stroke.  ROS:   Please see the history of present illness.    All other systems are reviewed and are negative  EKGs/Labs/Other Studies Reviewed:    The following studies were reviewed today: Nuclear stress test ECG and images  EKG:  EKG is  ordered today.  The ekg ordered today demonstrates sinus rhythm without ischemic repolarization abnormalities  Recent Labs: 05/05/2018: Magnesium 2.1 08/12/2018: Hemoglobin 12.2; Platelets 262 10/06/2018: ALT 11; BUN 21; Creatinine, Ser 1.06; Potassium 4.7; Sodium 138  Recent Lipid Panel    Component Value Date/Time   CHOL 186 10/06/2018 0827   TRIG 162 (H) 10/06/2018 0827   HDL 35 (L) 10/06/2018 0827   CHOLHDL 5.3 (H) 10/06/2018 0827   CHOLHDL 7 03/04/2015 0825   VLDL 43.4 (H) 03/04/2015 0825   LDLCALC 119 (H) 10/06/2018 0827   LDLDIRECT 182.0 03/04/2015 0825    Physical Exam:    VS:  BP 111/71   Pulse (!) 59   Ht 6' (1.829 m)   Wt 261 lb 6.4 oz (118.6 kg)   SpO2 98%   BMI 35.45 kg/m     Wt Readings from Last 3 Encounters:  10/17/18 261 lb 6.4 oz (118.6 kg)  09/26/18 263 lb 8 oz (119.5 kg)  08/29/18 260 lb (117.9  kg)     GEN: Obese,  Well nourished, well developed in no acute distress HEENT: Normal NECK: No JVD; No carotid bruits LYMPHATICS: No lymphadenopathy CARDIAC: RRR, no murmurs, rubs, S4 gallop is present RESPIRATORY:  Clear to auscultation without rales, wheezing or rhonchi  ABDOMEN: Soft, non-tender, non-distended MUSCULOSKELETAL:  No edema; No deformity  SKIN: Warm and dry NEUROLOGIC:  Alert and oriented x 3 PSYCHIATRIC:  Normal affect   ASSESSMENT:    1. Coronary artery disease involving coronary bypass graft of native heart without angina pectoris   2. Bilateral carotid artery stenosis: R 80-99%; L 40-59%   3. Heterozygous familial hypercholesterolemia   4. Essential hypertension   5. OBSTRUCTIVE SLEEP APNEA   6. Severe obesity (BMI 35.0-39.9) with comorbidity (HCC)   7. Paresthesia and pain of both upper extremities    PLAN:    In order of problems listed above:  1. CAD: Now asymptomatic.  He is exercising regularly.  He is back to work.  He is taking aspirin beta-blockers.  Reviewed the importance of healthy lifestyle changes.  He is eating a pretty good diet.  He needs to continue to exercise on a regular basis.  He states he has been doing a lot of improvements around the house and has been working hard, without any cardiovascular symptoms. 2. ASCVD s/p R CEA: Asymptomatic.  His neurological complaints do not sound consistent with hemispheric syndrome. 3. HLP: His LDL cholesterol remains severely elevated despite maximum dose statin therapy.  He clearly has heterozygous familial hypercholesterolemia and requires aggressive lipid-lowering for secondary prevention of atherosclerosis with severe complications.  We will start him on Repatha, recheck labs after 2 or 3 months 4. HTN: Excellent control 5. OSA: Reports 100% compliance with therapy and good results symptomatically with CPAP. 6. Obesity: He has made some improvements in his weight, but still has a long way to go to get  to healthy body mass index. 7. Neurological symptoms: These are highly suggestive of nerve compression/neuropathy.  Since they are bilateral the cervical spine would seem to be the expected location, but no  abnormalities were seen on the MRI of the cervical spine.  Consider thoracic outlet syndrome.  Continue follow-up with his neurologist.   Medication Adjustments/Labs and Tests Ordered: Current medicines are reviewed at length with the patient today.  Concerns regarding medicines are outlined above.  No orders of the defined types were placed in this encounter.  Meds ordered this encounter  Medications  . aspirin 81 MG tablet    Sig: Take 1 tablet (81 mg total) by mouth daily.    Patient Instructions  Medication Instructions:  Your physician recommends that you continue on your current medications as directed. Please refer to the Current Medication list given to you today.  If you need a refill on your cardiac medications before your next appointment, please call your pharmacy.   Lab work: None ordered If you have labs (blood work) drawn today and your tests are completely normal, you will receive your results only by: MyChart Message (if you have MyChart) OR A paper copy in the mail If you have any lab test that is abnormal or we need to change your treatment, we will call you to review the results.  Testing/Procedures: None ordered  Follow-Up: At [facility], you and your health needs are our priority.  As part of our continuing mission to provide you with exceptional heart care, we have created designated Provider Care Teams.  These Care Teams include your primary Cardiologist (physician) and Advanced Practice Providers (APPs -  Physician Assistants and Nurse Practitioners) who all work together to provide you with the care you need, when you need it. You will need a follow up appointment in 12 months.  Please call our office 2 months in advance to schedule this appointment.   You may see Thurmon FairMihai Milani Lowenstein, MD or one of the following Advanced Practice Providers on your designated Care Team: Azalee CourseHao Meng, PA-C Micah FlesherAngela Duke, PA-C       Signed, Thurmon FairMihai Oletha Tolson, MD  10/17/2018 6:50 PM    [facility]

## 2018-10-17 NOTE — Patient Instructions (Signed)

## 2018-11-01 DIAGNOSIS — Z0289 Encounter for other administrative examinations: Secondary | ICD-10-CM

## 2018-11-08 ENCOUNTER — Telehealth: Payer: Self-pay

## 2018-11-08 NOTE — Telephone Encounter (Signed)
Called and spoke w/pt's spouse regarding snf approving repatha through august of next year they stated that they had already scheduled delivery and that they will call us for any questions I mentioned that about 8 weeks in that the pt will need to have lipid labs drawn just to make sure that the drug is doing its job and pt's spouse is compliant

## 2018-11-22 ENCOUNTER — Encounter

## 2018-11-22 ENCOUNTER — Other Ambulatory Visit: Payer: Self-pay

## 2018-11-22 ENCOUNTER — Ambulatory Visit (INDEPENDENT_AMBULATORY_CARE_PROVIDER_SITE_OTHER): Payer: Managed Care, Other (non HMO) | Admitting: Neurology

## 2018-11-22 DIAGNOSIS — G5603 Carpal tunnel syndrome, bilateral upper limbs: Secondary | ICD-10-CM | POA: Diagnosis not present

## 2018-11-22 DIAGNOSIS — M79601 Pain in right arm: Secondary | ICD-10-CM

## 2018-11-22 DIAGNOSIS — M79602 Pain in left arm: Secondary | ICD-10-CM

## 2018-11-22 DIAGNOSIS — M542 Cervicalgia: Secondary | ICD-10-CM

## 2018-11-22 MED ORDER — GABAPENTIN 300 MG PO CAPS: 300.0000 mg | ORAL_CAPSULE | Freq: Three times a day (TID) | ORAL | 11 refills | Status: DC

## 2018-11-22 NOTE — Procedures (Signed)
Full Name: David Karnes" Christian Gender: Male MRN #: [id number] Date of Birth: 27-Aug-1975    Visit Date: 11/22/2018 09:10 Age: 44 Years 1 Months Old Examining Physician: Marcial Pacas, MD  Referring Physician: Marcial Pacas, MD History:  43 year old male presented with bilateral hands paresthesia, mainly involving first 3 fingers.  Summary of the tests:  Nerve conduction study: Bilateral median sensory responses showed moderately prolonged peak latency, with moderately decreased snap amplitude.  Bilateral median motor responses showed moderately prolonged distal latency, with normal C map amplitude, conduction velocity.  Bilateral ulnar sensory and motor responses were normal.  Electromyography: Selected needle examination of left upper extremity, and right upper extremity muscles was performed.  There is mild decreased recruitment pattern of bilateral abductor pollicis brevis, there is no evidence of spontaneous activity.   Conclusion: This is an abnormal study.  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   ------------------------------- Marcial Pacas, M.D. PhD  [facility] [place], [place] [address] Tel: [phone] Fax: [phone]        [facility]    Nerve / Sites Muscle Latency Ref. Amplitude Ref. Rel Amp Segments Distance Velocity Ref. Area    ms ms mV mV %  cm m/s m/s mVms  R Median - APB     Wrist APB 5.9 ?4.4 6.1 ?4.0 100 Wrist - APB 7   25.9     Upper arm APB 10.7  6.0  99.3 Upper arm - Wrist 24 50 ?49 25.9  L Median - APB     Wrist APB 6.3 ?4.4 5.7 ?4.0 100 Wrist - APB 7   20.8     Upper arm APB 10.9  5.7  98.7 Upper arm - Wrist 23 50 ?49 20.7  R Ulnar - ADM     Wrist ADM 2.7 ?3.3 9.1 ?6.0 100 Wrist - ADM 7   33.3     B.Elbow ADM 6.6  9.2  101 B.Elbow - Wrist 21 53 ?49 32.3     A.Elbow ADM 8.5  9.1  98.7 A.Elbow - B.Elbow 10 53 ?49 32.3         A.Elbow  - Wrist      L Ulnar - ADM     Wrist ADM 2.8 ?3.3 7.5 ?6.0 100 Wrist - ADM 7   26.7     B.Elbow ADM 6.0  6.8  90.5 B.Elbow - Wrist 19 59 ?49 25.6     A.Elbow ADM 8.0  6.4  93.5 A.Elbow - B.Elbow 10 49 ?49 25.4         A.Elbow - Wrist                 SNC    Nerve / Sites Rec. Site Peak Lat Ref.  Amp Ref. Segments Distance Peak Diff Ref.    ms ms V V  cm ms ms  R Median, Ulnar - Transcarpal comparison     Median Palm Wrist 3.8 ?2.2 10 ?35 Median Palm - Wrist 8       Ulnar Palm Wrist 1.7 ?2.2 13 ?12 Ulnar Palm - Wrist 8          Median Palm - Ulnar Palm  2.0 ?0.4  L Median, Ulnar - Transcarpal comparison     Median Palm Wrist 3.9 ?2.2 17 ?35 Median Palm - Wrist 8       Ulnar Palm Wrist 1.8 ?2.2  12 ?12 Ulnar Palm - Wrist 8          Median Palm - Ulnar Palm  2.1 ?0.4  R Median - Orthodromic (Dig II, Mid palm)     Dig II Wrist 5.6 ?3.4 3 ?10 Dig II - Wrist 13    L Median - Orthodromic (Dig II, Mid palm)     Dig II Wrist 5.1 ?3.4 2 ?10 Dig II - Wrist 13    R Ulnar - Orthodromic, (Dig V, Mid palm)     Dig V Wrist 2.6 ?3.1 6 ?5 Dig V - Wrist 11    L Ulnar - Orthodromic, (Dig V, Mid palm)     Dig V Wrist 2.7 ?3.1 9 ?5 Dig V - Wrist 3611                   F  Wave    Nerve F Lat Ref.   ms ms  R Ulnar - ADM 30.4 ?32.0  L Ulnar - ADM 31.4 ?32.0         EMG       EMG Summary Table    Spontaneous MUAP Recruitment  Muscle IA Fib PSW Fasc Other Amp Dur. Poly Pattern  L. Abductor pollicis brevis Normal None None None _______ Normal Normal Normal Reduced  L. Pronator teres Normal None None None _______ Normal Normal Normal Normal  L. Biceps brachii Normal None None None _______ Normal Normal Normal Normal  L. Brachioradialis Normal None None None _______ Normal Normal Normal Normal  L. Flexor carpi ulnaris Normal None None None _______ Normal Normal Normal Normal  R. Abductor pollicis brevis Normal None None None _______ Normal Normal Normal Reduced

## 2018-11-22 NOTE — Progress Notes (Signed)
PATIENT: David FrankelMatthew D Christian DOB: November 12, 1975  No chief complaint on file.    HISTORICAL  David Christian is 43 year old male, seen in request by my care physician Dr. Arlyce DiceJohn Slakostky and vascular surgeon Dr. Tawanna Coolerodd Christian for evaluation of bilateral hands numbness initial evaluation was on September 26, 2018.  I have reviewed and summarized the referring note from the referring physician.  He had past medical history of hypertension, hyperlipidemia, coronary artery disease, bypass surgery on May 05, 2018, during work-up, he was found to have asymptomatic right side high-grade carotid artery stenosis, he had right carotid endarterectomy by Dr. Tawanna Coolerodd Christian on August 11, 2018.  Around May 2020, without clear triggers, he began to notice intermittent bilateral hands numbness tingling, involving first 3 fingers, initially it was intermittent, now is happens more frequent, he denies muscle weakness, he has to shake his hand to get the sensation back, he does have neck pain, radiating pain to bilateral shoulder, and bilateral arm.  He denies gait abnormality, denies bilateral feet paresthesia  Laboratory evaluations BMP showed elevated glucose 153 creatinine of 0.86, CBC hemoglobin of 12.2,  Update November 22, 2018: He return for electrodiagnostic study today, which confirmed a diagnosis of moderate bilateral carpal tunnel syndromes.  There is no evidence of left cervical radiculopathy.  We personally reviewed MRI of cervical spine in August 2020: Mild degenerative changes, there was no significant canal or foraminal narrowing  He has tried wrist splint without significant help, will add on gabapentin, refer him to hand surgeon for further evaluation   REVIEW OF SYSTEMS: Full 14 system review of systems performed and notable only for as above All other review of systems were negative.  ALLERGIES: No Known Allergies  HOME MEDICATIONS: Current Outpatient Medications  Medication Sig Dispense Refill   . acetaminophen (TYLENOL) 325 MG tablet Take 2 tablets (650 mg total) by mouth every 6 (six) hours as needed for mild pain.    [name]. aspirin 81 MG tablet Take 1 tablet (81 mg total) by mouth daily.    [name]. atorvastatin (LIPITOR) 80 MG tablet Take 1 tablet (80 mg total) by mouth every evening. 30 tablet 5  . loratadine (CLARITIN) 10 MG tablet Take 10 mg by mouth daily.    . metoprolol tartrate (LOPRESSOR) 25 MG tablet Take 1 tablet (25 mg total) by mouth 2 (two) times daily. 60 tablet 5  . sertraline (ZOLOFT) 50 MG tablet Take 50 mg by mouth daily.      No current facility-administered medications for this visit.     PAST MEDICAL HISTORY: Past Medical History:  Diagnosis Date  . Anginal pain (HCC)    pt. denies  . Coronary artery disease   . GERD (gastroesophageal reflux disease)   . Hyperlipidemia   . Hypertension   . Neck pain   . Sleep apnea    uses CPAP    PAST SURGICAL HISTORY: Past Surgical History:  Procedure Laterality Date  . CORONARY ARTERY BYPASS GRAFT N/A 05/04/2018   Procedure: CORONARY ARTERY BYPASS GRAFTING (CABG) x5 using right greater saphenous vein harvested endoscopically and bilateral left internal mammary arteries;  Surgeon: David Christian, David C, MD;  Location: [facility] OR;  Service: Open Heart Surgery;  Laterality: N/A;  possible bilateral IMA  . ELBOW SURGERY    . ENDARTERECTOMY Right 08/11/2018   Procedure: right carotid ENDARTERECTOMY;  Surgeon: David Christian, David F, MD;  [facility];  Service: Vascular;  Laterality: Right;  . HAND SURGERY    . LEFT HEART  CATH AND CORONARY ANGIOGRAPHY N/A 05/03/2018   Procedure: LEFT HEART CATH AND CORONARY ANGIOGRAPHY;  Surgeon: David Lex, MD;  Location: [facility] INVASIVE CV LAB;  Service: Cardiovascular;  Laterality: N/A;  . MASS EXCISION Right 10/31/2014   Procedure: EXCISION ANTERIOR NECK & RIGHT SUBMANDIBULAR CYSTS WITH COMPLEX CLOSURES;  Surgeon: David Murders, MD;  Location: [facility];  Service: ENT;  Laterality: Right;  CPAP   . PATCH ANGIOPLASTY Right 08/11/2018   Procedure: Patch Angioplasty of right carotid artery using hemashield platinum finesse patch;  Surgeon: David Earthly, MD;  Location: [facility] OR;  Service: Vascular;  Laterality: Right;  . TEE WITHOUT CARDIOVERSION N/A 05/04/2018   Procedure: TRANSESOPHAGEAL ECHOCARDIOGRAM (TEE);  Surgeon: David Slot, MD;  Location: [facility] OR;  Service: Open Heart Surgery;  Laterality: N/A;    FAMILY HISTORY: Family History  Problem Relation Age of Onset  . Healthy Mother   . Healthy Father   . Arthritis Maternal Grandfather   . Arthritis Paternal Grandmother   . Diabetes Neg Hx   . Heart disease Neg Hx   . Stroke Neg Hx     SOCIAL HISTORY: Social History   Socioeconomic History  . Marital status: Married    Spouse name: Not on file  . Number of children: 5  . Years of education: some college  . Highest education level: Not on file  Occupational History  . Occupation: Development worker, international aid  Social Needs  . Financial resource strain: Not on file  . Food insecurity    Worry: Not on file    Inability: Not on file  . Transportation needs    Medical: Not on file    Non-medical: Not on file  Tobacco Use  . Smoking status: Former Smoker    Years: 6.00    Quit date: 08/07/1999    Years since quitting: 19.3  . Smokeless tobacco: Former Neurosurgeon    Types: Chew    Quit date: 08/09/2017  Substance and Sexual Activity  . Alcohol use: Yes    Alcohol/week: 1.0 standard drinks    Types: 1 Cans of beer per week    Comment: rare  . Drug use: No  . Sexual activity: Not on file  Lifestyle  . Physical activity    Days per week: Not on file    Minutes per session: Not on file  . Stress: Not on file  Relationships  . Social Musician on phone: Not on file    Gets together: Not on file    Attends religious service: Not on file    Active member of club or organization: Not on file    Attends meetings of clubs or organizations: Not on file    Relationship  status: Not on file  . Intimate partner violence    Fear of current or ex partner: Not on file    Emotionally abused: Not on file    Physically abused: Not on file    Forced sexual activity: Not on file  Other Topics Concern  . Not on file  Social History Narrative   Lives at home with wife and family.   Right-handed.   Rare caffeine.     PHYSICAL EXAM   There were no vitals filed for this visit.  Not recorded      There is no height or weight on file to calculate BMI.  PHYSICAL EXAMNIATION:     NEUROLOGICAL EXAM:  MENTAL STATUS: Speech:    Speech is normal;  fluent and spontaneous with normal comprehension.  Cognition:     Orientation to time, place and person     Normal recent and remote memory     Normal Attention span and concentration     Normal Language, naming, repeating,spontaneous speech     Fund of knowledge   CRANIAL NERVES: CN II: Visual fields are full to confrontation.  Pupils are round equal and briskly reactive to light. CN III, IV, VI: extraocular movement are normal. No ptosis. CN V: Facial sensation is intact to pinprick in all 3 divisions bilaterally. Corneal responses are intact.  CN VII: Face is symmetric with normal eye closure and smile. CN VIII: Hearing is normal to rubbing fingers CN IX, X: Palate elevates symmetrically. Phonation is normal. CN XI: Head turning and shoulder shrug are intact CN XII: Tongue is midline with normal movements and no atrophy.  MOTOR: There is no pronator drift of out-stretched arms. Muscle bulk and tone are normal. Muscle strength is normal.  REFLEXES: Reflexes are 2+ and symmetric at the biceps, triceps, knees, and ankles. Plantar responses are flexor.  SENSORY: Intact to light touch, pinprick, positional sensation and vibratory sensation are intact in fingers and toes.  COORDINATION:  There is no dysmetria   GAIT/STANCE: Posture is normal. Gait is steady with normal steps, base, arm swing, and  turning.     DIAGNOSTIC DATA (LABS, IMAGING, TESTING) - I reviewed patient records, labs, notes, testing and imaging myself where available.   ASSESSMENT AND PLAN  LAMONTAE RICARDO is a 43 y.o. male   Moderate bilateral carpal tunnel syndromes  Continue wrist splint, as needed NSAIDs,  Add on gabapentin 300 mg 3 times daily  Refer him to hand surgeon   Marcial Pacas, M.D. Ph.D.  [facility] [address] [place], [place] [address] Ph: [phone] Fax: [phone]  CC: Referring Provider

## 2018-11-27 ENCOUNTER — Other Ambulatory Visit: Payer: Self-pay | Admitting: Orthopedic Surgery

## 2018-11-27 ENCOUNTER — Telehealth: Payer: Self-pay | Admitting: *Deleted

## 2018-11-27 NOTE — Telephone Encounter (Signed)
   [facility] Pre-operative Risk Assessment    Request for surgical clearance:  1. What type of surgery is being performed? RIGHT CARPAL TUNNEL RELEASE   2. When is this surgery scheduled? 12/04/18   3. What type of clearance is required (medical clearance vs. Pharmacy clearance to hold med vs. Both)? MEDICAL  4. Are there any medications that need to be held prior to surgery and how long? ASA    5. Practice name and name of physician performing surgery? [facility]; DR. Fredna Dow   6. What is your office phone number [phone]    7.   What is your office fax number [phone]  8.   Anesthesia type (None, local, MAC, general) ? CHOICE   David Christian 11/27/2018, 3:37 PM  _________________________________________________________________   (provider comments below)

## 2018-11-27 NOTE — Progress Notes (Signed)
I have personally reviewed this patients chart and cardiac history with Dr. Tobias Alexander. Per Dr. Tobias Alexander there is no need for any further cardiac clearance and this pt is appropriate to have surgery at [facility].

## 2018-11-28 ENCOUNTER — Other Ambulatory Visit: Payer: Self-pay

## 2018-11-28 ENCOUNTER — Encounter (HOSPITAL_BASED_OUTPATIENT_CLINIC_OR_DEPARTMENT_OTHER): Payer: Self-pay | Admitting: *Deleted

## 2018-11-28 NOTE — Telephone Encounter (Signed)
I spoke with Dr. Levell July nurse regarding aspirin as patient just had CABG in Feb and carotid surgery in June, she informed me that Dr. Fredna Dow prefer the patient to be off of aspirin however can certainly do the procedure on the aspirin if he has to.   I spoke with the patient as well, he has no issues after his CABG or vascular surgery. He denies any recent exertional chest pain or shortness of breath. He also mentions that Dr. Fredna Dow told him that he prefer Mr Leino to be off aspirin, but can do the surgery on aspirin if needed.   I plan to send final clearance letter after hearing back from Dr. Sallyanne Kuster and Dr. Donnetta Hutching to see if they also prefer the patient to continue aspirin through the surgery

## 2018-11-28 NOTE — Telephone Encounter (Signed)
It is preferable to continue aspirin 81 mg daily in my opinion.

## 2018-11-29 NOTE — Telephone Encounter (Signed)
   Primary Cardiologist: Sanda Klein, MD  Chart reviewed as part of pre-operative protocol coverage. Patient was contacted 11/29/2018 in reference to pre-operative risk assessment for pending surgery as outlined below.  David Christian was last seen on 10/17/2018 by Dr. Sallyanne Kuster.  Since that day, David Christian has done well.  Therefore, based on ACC/AHA guidelines, the patient would be at acceptable risk for the planned procedure without further cardiovascular testing.   I will route this recommendation to the requesting party via Epic fax function and remove from pre-op pool.  Please call with questions. We recommend continue aspirin through the surgery given recent CABG and right carotid endarterectomy.   [place], [place] 11/29/2018, 8:29 AM

## 2018-11-29 NOTE — Telephone Encounter (Signed)
Called patient and informed him the recommendation by Dr. Sallyanne Kuster that he continue to take his ASA 81 mg daily and that the clearance will be forwarded/fas to the requesting office. All question (if any) were answered.

## 2018-11-30 ENCOUNTER — Other Ambulatory Visit (HOSPITAL_COMMUNITY)
Admission: RE | Admit: 2018-11-30 | Discharge: 2018-11-30 | Disposition: A | Discharge status: Home / Self Care | Payer: Managed Care, Other (non HMO) | Source: Ambulatory Visit | Attending: Orthopedic Surgery | Admitting: Orthopedic Surgery

## 2018-11-30 DIAGNOSIS — Z20828 Contact with and (suspected) exposure to other viral communicable diseases: Secondary | ICD-10-CM | POA: Insufficient documentation

## 2018-11-30 DIAGNOSIS — Z01812 Encounter for preprocedural laboratory examination: Secondary | ICD-10-CM | POA: Insufficient documentation

## 2018-12-01 LAB — NOVEL CORONAVIRUS, NAA (HOSP ORDER, SEND-OUT TO REF LAB; TAT 18-24 HRS): SARS-CoV-2, NAA: NOT DETECTED

## 2018-12-01 NOTE — Progress Notes (Signed)

## 2018-12-04 ENCOUNTER — Ambulatory Visit (HOSPITAL_BASED_OUTPATIENT_CLINIC_OR_DEPARTMENT_OTHER): Payer: Managed Care, Other (non HMO) | Admitting: Anesthesiology

## 2018-12-04 ENCOUNTER — Encounter (HOSPITAL_BASED_OUTPATIENT_CLINIC_OR_DEPARTMENT_OTHER)
Admission: RE | Disposition: A | Discharge status: Home / Self Care | Payer: Self-pay | Source: Home / Self Care | Attending: Orthopedic Surgery

## 2018-12-04 ENCOUNTER — Other Ambulatory Visit: Payer: Self-pay

## 2018-12-04 ENCOUNTER — Encounter (HOSPITAL_BASED_OUTPATIENT_CLINIC_OR_DEPARTMENT_OTHER): Payer: Self-pay

## 2018-12-04 ENCOUNTER — Ambulatory Visit (HOSPITAL_BASED_OUTPATIENT_CLINIC_OR_DEPARTMENT_OTHER)
Admission: RE | Admit: 2018-12-04 | Discharge: 2018-12-04 | Disposition: A | Discharge status: Home / Self Care | Payer: Managed Care, Other (non HMO) | Attending: Orthopedic Surgery | Admitting: Orthopedic Surgery

## 2018-12-04 DIAGNOSIS — Z7982 Long term (current) use of aspirin: Secondary | ICD-10-CM | POA: Diagnosis not present

## 2018-12-04 DIAGNOSIS — G709 Myoneural disorder, unspecified: Secondary | ICD-10-CM | POA: Diagnosis not present

## 2018-12-04 DIAGNOSIS — Z6835 Body mass index (BMI) 35.0-35.9, adult: Secondary | ICD-10-CM | POA: Diagnosis not present

## 2018-12-04 DIAGNOSIS — E785 Hyperlipidemia, unspecified: Secondary | ICD-10-CM | POA: Diagnosis not present

## 2018-12-04 DIAGNOSIS — G4730 Sleep apnea, unspecified: Secondary | ICD-10-CM | POA: Insufficient documentation

## 2018-12-04 DIAGNOSIS — Z79899 Other long term (current) drug therapy: Secondary | ICD-10-CM | POA: Insufficient documentation

## 2018-12-04 DIAGNOSIS — Z951 Presence of aortocoronary bypass graft: Secondary | ICD-10-CM | POA: Insufficient documentation

## 2018-12-04 DIAGNOSIS — G5601 Carpal tunnel syndrome, right upper limb: Secondary | ICD-10-CM | POA: Diagnosis not present

## 2018-12-04 DIAGNOSIS — I2510 Atherosclerotic heart disease of native coronary artery without angina pectoris: Secondary | ICD-10-CM | POA: Diagnosis not present

## 2018-12-04 DIAGNOSIS — Z87891 Personal history of nicotine dependence: Secondary | ICD-10-CM | POA: Insufficient documentation

## 2018-12-04 DIAGNOSIS — I10 Essential (primary) hypertension: Secondary | ICD-10-CM | POA: Insufficient documentation

## 2018-12-04 HISTORY — PX: CARPAL TUNNEL RELEASE: SHX101

## 2018-12-04 SURGERY — CARPAL TUNNEL RELEASE
Anesthesia: Regional | Site: Wrist | Laterality: Right

## 2018-12-04 MED ORDER — PROPOFOL 10 MG/ML IV BOLUS
INTRAVENOUS | Status: AC
  Filled 2018-12-04: qty 20

## 2018-12-04 MED ORDER — PROMETHAZINE HCL 25 MG/ML IJ SOLN: 6.2500 mg | INTRAMUSCULAR | Status: DC | PRN

## 2018-12-04 MED ORDER — MEPERIDINE HCL 25 MG/ML IJ SOLN: 6.2500 mg | INTRAMUSCULAR | Status: DC | PRN

## 2018-12-04 MED ORDER — LIDOCAINE 2% (20 MG/ML) 5 ML SYRINGE
INTRAMUSCULAR | Status: AC
  Filled 2018-12-04: qty 5

## 2018-12-04 MED ORDER — OXYCODONE HCL 5 MG PO TABS: 5.0000 mg | ORAL_TABLET | Freq: Once | ORAL | Status: DC | PRN

## 2018-12-04 MED ORDER — PROPOFOL 10 MG/ML IV BOLUS
INTRAVENOUS | Status: DC | PRN
  Administered 2018-12-04: 30 mg via INTRAVENOUS

## 2018-12-04 MED ORDER — ONDANSETRON HCL 4 MG/2ML IJ SOLN
INTRAMUSCULAR | Status: DC | PRN
  Administered 2018-12-04: 4 mg via INTRAVENOUS

## 2018-12-04 MED ORDER — FENTANYL CITRATE (PF) 100 MCG/2ML IJ SOLN
INTRAMUSCULAR | Status: AC
  Filled 2018-12-04: qty 2

## 2018-12-04 MED ORDER — LACTATED RINGERS IV SOLN: INTRAVENOUS | Status: DC

## 2018-12-04 MED ORDER — MIDAZOLAM HCL 2 MG/2ML IJ SOLN
INTRAMUSCULAR | Status: DC | PRN
  Administered 2018-12-04: 2 mg via INTRAVENOUS

## 2018-12-04 MED ORDER — ONDANSETRON HCL 4 MG/2ML IJ SOLN
INTRAMUSCULAR | Status: AC
  Filled 2018-12-04: qty 2

## 2018-12-04 MED ORDER — FENTANYL CITRATE (PF) 100 MCG/2ML IJ SOLN: 25.0000 ug | INTRAMUSCULAR | Status: DC | PRN

## 2018-12-04 MED ORDER — BUPIVACAINE HCL (PF) 0.5 % IJ SOLN
INTRAMUSCULAR | Status: AC
  Filled 2018-12-04: qty 30

## 2018-12-04 MED ORDER — MIDAZOLAM HCL 2 MG/2ML IJ SOLN: 1.0000 mg | INTRAMUSCULAR | Status: DC | PRN

## 2018-12-04 MED ORDER — MIDAZOLAM HCL 2 MG/2ML IJ SOLN
INTRAMUSCULAR | Status: AC
  Filled 2018-12-04: qty 2

## 2018-12-04 MED ORDER — OXYCODONE HCL 5 MG/5ML PO SOLN: 5.0000 mg | Freq: Once | ORAL | Status: DC | PRN

## 2018-12-04 MED ORDER — HYDROCODONE-ACETAMINOPHEN 5-325 MG PO TABS: ORAL_TABLET | ORAL | 0 refills | Status: DC

## 2018-12-04 MED ORDER — LACTATED RINGERS IV SOLN
INTRAVENOUS | Status: DC | PRN
  Administered 2018-12-04: 12:00:00 via INTRAVENOUS

## 2018-12-04 MED ORDER — PROPOFOL 500 MG/50ML IV EMUL
INTRAVENOUS | Status: DC | PRN
  Administered 2018-12-04: 125 ug/kg/min via INTRAVENOUS

## 2018-12-04 MED ORDER — CEFAZOLIN SODIUM-DEXTROSE 2-3 GM-%(50ML) IV SOLR
INTRAVENOUS | Status: DC | PRN
  Administered 2018-12-04: 2 g via INTRAVENOUS

## 2018-12-04 MED ORDER — 0.9 % SODIUM CHLORIDE (POUR BTL) OPTIME
TOPICAL | Status: DC | PRN
  Administered 2018-12-04: 120 mL

## 2018-12-04 MED ORDER — FENTANYL CITRATE (PF) 100 MCG/2ML IJ SOLN
INTRAMUSCULAR | Status: DC | PRN
  Administered 2018-12-04 (×2): 50 ug via INTRAVENOUS

## 2018-12-04 MED ORDER — BUPIVACAINE HCL (PF) 0.25 % IJ SOLN
INTRAMUSCULAR | Status: AC
  Filled 2018-12-04: qty 30

## 2018-12-04 MED ORDER — LIDOCAINE HCL (PF) 0.5 % IJ SOLN
INTRAMUSCULAR | Status: DC | PRN
  Administered 2018-12-04: 35 mL via INTRAVENOUS

## 2018-12-04 MED ORDER — BUPIVACAINE HCL (PF) 0.25 % IJ SOLN
INTRAMUSCULAR | Status: DC | PRN
  Administered 2018-12-04: 10 mL

## 2018-12-04 MED ORDER — FENTANYL CITRATE (PF) 100 MCG/2ML IJ SOLN: 50.0000 ug | INTRAMUSCULAR | Status: DC | PRN

## 2018-12-04 MED ORDER — CEFAZOLIN SODIUM-DEXTROSE 2-4 GM/100ML-% IV SOLN
INTRAVENOUS | Status: AC
  Filled 2018-12-04: qty 100

## 2018-12-04 SURGICAL SUPPLY — 41 items
APL PRP STRL LF DISP 70% ISPRP (MISCELLANEOUS) ×1
BLADE SURG 15 STRL LF DISP TIS (BLADE) ×2 IMPLANT
BLADE SURG 15 STRL SS (BLADE) ×6
BNDG CMPR 9X4 STRL LF SNTH (GAUZE/BANDAGES/DRESSINGS)
BNDG ELASTIC 3X5.8 VLCR STR LF (GAUZE/BANDAGES/DRESSINGS) ×3 IMPLANT
BNDG ESMARK 4X9 LF (GAUZE/BANDAGES/DRESSINGS) IMPLANT
BNDG GAUZE ELAST 4 BULKY (GAUZE/BANDAGES/DRESSINGS) ×3 IMPLANT
CHLORAPREP W/TINT 26 (MISCELLANEOUS) ×3 IMPLANT
CORD BIPOLAR FORCEPS 12FT (ELECTRODE) ×3 IMPLANT
COVER BACK TABLE REUSABLE LG (DRAPES) ×3 IMPLANT
COVER MAYO STAND REUSABLE (DRAPES) ×3 IMPLANT
COVER WAND RF STERILE (DRAPES) IMPLANT
CUFF TOURN SGL QUICK 18X4 (TOURNIQUET CUFF) ×3 IMPLANT
DRAPE EXTREMITY T 121X128X90 (DISPOSABLE) ×3 IMPLANT
DRAPE SURG 17X23 STRL (DRAPES) ×3 IMPLANT
DRSG PAD ABDOMINAL 8X10 ST (GAUZE/BANDAGES/DRESSINGS) ×3 IMPLANT
GAUZE SPONGE 4X4 12PLY STRL (GAUZE/BANDAGES/DRESSINGS) ×3 IMPLANT
GAUZE XEROFORM 1X8 LF (GAUZE/BANDAGES/DRESSINGS) ×3 IMPLANT
GLOVE BIO SURGEON STRL SZ 6.5 (GLOVE) ×1 IMPLANT
GLOVE BIO SURGEON STRL SZ7 (GLOVE) ×2 IMPLANT
GLOVE BIO SURGEON STRL SZ7.5 (GLOVE) ×3 IMPLANT
GLOVE BIO SURGEONS STRL SZ 6.5 (GLOVE) ×1
GLOVE BIOGEL PI IND STRL 6.5 (GLOVE) IMPLANT
GLOVE BIOGEL PI IND STRL 8 (GLOVE) ×1 IMPLANT
GLOVE BIOGEL PI INDICATOR 6.5 (GLOVE) ×4
GLOVE BIOGEL PI INDICATOR 8 (GLOVE) ×2
GOWN STRL REUS W/ TWL LRG LVL3 (GOWN DISPOSABLE) ×1 IMPLANT
GOWN STRL REUS W/TWL LRG LVL3 (GOWN DISPOSABLE) ×3
GOWN STRL REUS W/TWL XL LVL3 (GOWN DISPOSABLE) ×3 IMPLANT
NDL HYPO 25X1 1.5 SAFETY (NEEDLE) ×1 IMPLANT
NEEDLE HYPO 25X1 1.5 SAFETY (NEEDLE) ×3 IMPLANT
NS IRRIG 1000ML POUR BTL (IV SOLUTION) ×3 IMPLANT
PACK BASIN DAY SURGERY FS (CUSTOM PROCEDURE TRAY) ×3 IMPLANT
PADDING CAST ABS 4INX4YD NS (CAST SUPPLIES) ×2
PADDING CAST ABS COTTON 4X4 ST (CAST SUPPLIES) ×1 IMPLANT
STOCKINETTE 4X48 STRL (DRAPES) ×3 IMPLANT
SUT ETHILON 4 0 PS 2 18 (SUTURE) ×5 IMPLANT
SYR BULB 3OZ (MISCELLANEOUS) ×3 IMPLANT
SYR CONTROL 10ML LL (SYRINGE) ×3 IMPLANT
TOWEL GREEN STERILE FF (TOWEL DISPOSABLE) ×6 IMPLANT
UNDERPAD 30X36 HEAVY ABSORB (UNDERPADS AND DIAPERS) ×3 IMPLANT

## 2018-12-04 NOTE — Anesthesia Preprocedure Evaluation (Addendum)
Anesthesia Evaluation  Patient identified by MRN, date of birth, ID band Patient awake    Reviewed: Allergy & Precautions, NPO status , Patient's Chart, lab work & pertinent test results, reviewed documented beta blocker date and time   History of Anesthesia Complications Negative for: history of anesthetic complications  Airway Mallampati: II  TM Distance: >3 FB Neck ROM: Full    Dental  (+) Dental Advisory Given   Pulmonary sleep apnea , former smoker,    Pulmonary exam normal        Cardiovascular hypertension, Pt. on medications and Pt. on home beta blockers + angina + CAD and + CABG  Normal cardiovascular exam Rhythm:Regular  IMPRESSIONS    1. The left ventricle has hyperdynamic systolic function, with an ejection fraction of >65%. The cavity size was normal. Left ventricular diastolic Doppler parameters are consistent with impaired relaxation.  2. The right ventricle has normal systolic function. The cavity was normal. There is no increase in right ventricular wall thickness.  3. The mitral valve is normal in structure.  4. The tricuspid valve is normal in structure.  5. The aortic valve is tricuspid Mild thickening of the aortic valve Mild calcification of the aortic valve.  6. The pulmonic valve was normal in structure.   Neuro/Psych  Neuromuscular disease negative psych ROS   GI/Hepatic Neg liver ROS, GERD  ,  Endo/Other  Morbid obesity  Renal/GU negative Renal ROS     Musculoskeletal   Abdominal (+) + obese,   Peds  Hematology   Anesthesia Other Findings   Reproductive/Obstetrics                             Anesthesia Physical  Anesthesia Plan  ASA: III  Anesthesia Plan: Bier Block and Bier Block-LIDOCAINE ONLY   Post-op Pain Management:    Induction: Intravenous  PONV Risk Score and Plan: 3 and Scopolamine patch - Pre-op, Ondansetron and Dexamethasone  Airway  Management Planned: Natural Airway  Additional Equipment:   Intra-op Plan:   Post-operative Plan:   Informed Consent: I have reviewed the patients History and Physical, chart, labs and discussed the procedure including the risks, benefits and alternatives for the proposed anesthesia with the patient or authorized representative who has indicated his/her understanding and acceptance.     Dental advisory given  Plan Discussed with: CRNA  Anesthesia Plan Comments:        Anesthesia Quick Evaluation

## 2018-12-04 NOTE — H&P (Signed)
David Christian is an 43 y.o. male.   Chief Complaint: right carpal tunnel syndrome HPI: 43 yo male with numbness and tingling right hand.  Nocturnal symptoms.  Positive nerve conduction studies.  He wishes to proceed with carpal tunnel release.  Allergies: No Known Allergies  Past Medical History:  Diagnosis Date  . Anginal pain (HCC)    pt. denies  . Coronary artery disease   . GERD (gastroesophageal reflux disease)   . Hyperlipidemia   . Hypertension   . Neck pain   . Sleep apnea    uses CPAP    Past Surgical History:  Procedure Laterality Date  . CARDIAC CATHETERIZATION    . CORONARY ARTERY BYPASS GRAFT N/A 05/04/2018   Procedure: CORONARY ARTERY BYPASS GRAFTING (CABG) x5 using right greater saphenous vein harvested endoscopically and bilateral left internal mammary arteries;  Surgeon: [name], MD;  Location: [facility] OR;  Service: Open Heart Surgery;  Laterality: N/A;  possible bilateral IMA  . ELBOW SURGERY    . ENDARTERECTOMY Right 08/11/2018   Procedure: right carotid ENDARTERECTOMY;  Surgeon: [name], MD;  Location: [facility] OR;  Service: Vascular;  Laterality: Right;  . HAND SURGERY    . LEFT HEART CATH AND CORONARY ANGIOGRAPHY N/A 05/03/2018   Procedure: LEFT HEART CATH AND CORONARY ANGIOGRAPHY;  Surgeon: [name], MD;  Location: [facility] INVASIVE CV LAB;  Service: Cardiovascular;  Laterality: N/A;  . MASS EXCISION Right 10/31/2014   Procedure: EXCISION ANTERIOR NECK & RIGHT SUBMANDIBULAR CYSTS WITH COMPLEX CLOSURES;  Surgeon: [name], MD;  Location: [facility];  Service: ENT;  Laterality: Right;  CPAP  . PATCH ANGIOPLASTY Right 08/11/2018   Procedure: Patch Angioplasty of right carotid artery using hemashield platinum finesse patch;  Surgeon: [name], MD;  Location: [facility] OR;  Service: Vascular;  Laterality: Right;  . TEE WITHOUT CARDIOVERSION N/A 05/04/2018   Procedure: TRANSESOPHAGEAL ECHOCARDIOGRAM (TEE);  Surgeon: [name], MD;   Location: [facility] OR;  Service: Open Heart Surgery;  Laterality: N/A;    Family History: Family History  Problem Relation Age of Onset  . Healthy Mother   . Healthy Father   . Arthritis Maternal Grandfather   . Arthritis Paternal Grandmother   . Diabetes Neg Hx   . Heart disease Neg Hx   . Stroke Neg Hx     Social History:   reports that he quit smoking about 19 years ago. He quit after 6.00 years of use. He quit smokeless tobacco use about 15 months ago.  His smokeless tobacco use included chew. He reports current alcohol use of about 1.0 standard drinks of alcohol per week. He reports that he does not use drugs.  Medications: Medications Prior to Admission  Medication Sig Dispense Refill  . aspirin 81 MG tablet Take 1 tablet (81 mg total) by mouth daily. (Patient taking differently: Take 325 mg by mouth daily. )    . atorvastatin (LIPITOR) 80 MG tablet Take 1 tablet (80 mg total) by mouth every evening. 30 tablet 5  . Evolocumab (REPATHA) 140 MG/ML SOSY Inject into the skin.    [name] gabapentin (NEURONTIN) 300 MG capsule Take 1 capsule (300 mg total) by mouth 3 (three) times daily. 90 capsule 11  . loratadine (CLARITIN) 10 MG tablet Take 10 mg by mouth daily.    . metoprolol tartrate (LOPRESSOR) 25 MG tablet Take 1 tablet (25 mg total) by mouth 2 (two) times daily. 60 tablet 5  . sertraline (ZOLOFT) 50  MG tablet Take 50 mg by mouth daily.     [name] acetaminophen (TYLENOL) 325 MG tablet Take 2 tablets (650 mg total) by mouth every 6 (six) hours as needed for mild pain.      No results found for this or any previous visit (from the past 48 hour(s)).  No results found.   A comprehensive review of systems was negative.  Blood pressure 123/78, pulse 72, temperature 98.9 F (37.2 C), temperature source Oral, resp. rate 14, height 6' (1.829 m), weight 119.2 kg, SpO2 100 %.  General appearance: alert, cooperative and appears stated age Head: Normocephalic, without obvious abnormality,  atraumatic Neck: supple, symmetrical, trachea midline Cardio: regular rate and rhythm Resp: clear to auscultation bilaterally Extremities: Intact sensation and capillary refill all digits.  +epl/fpl/io.  No wounds.  Pulses: 2+ and symmetric Skin: Skin color, texture, turgor normal. No rashes or lesions Neurologic: Grossly normal Incision/Wound: none  Assessment/Plan Right carpal tunnel syndrome.  Non operative and operative treatment options have been discussed with the patient and patient wishes to proceed with operative treatment. Risks, benefits, and alternatives of surgery have been discussed and the patient agrees with the plan of care.   [name] 12/04/2018, 12:48 PM

## 2018-12-04 NOTE — Transfer of Care (Signed)
Immediate Anesthesia Transfer of Care Note  Patient: David Christian  Procedure(s) Performed: RIGHT CARPAL TUNNEL RELEASE (Right Wrist)  Patient Location: PACU  Anesthesia Type:MAC and Bier block  Level of Consciousness: drowsy and patient cooperative  Airway & Oxygen Therapy: Patient Spontanous Breathing and Patient connected to face mask oxygen  Post-op Assessment: Report given to RN and Post -op Vital signs reviewed and stable  Post vital signs: Reviewed and stable  Last Vitals:  Vitals Value Taken Time  BP 140/65   Temp    Pulse 71 12/04/18 1417  Resp 11 12/04/18 1417  SpO2 98 % 12/04/18 1417  Vitals shown include unvalidated device data.  Last Pain:  Vitals:   12/04/18 1214  TempSrc: Oral  PainSc: 4       Patients Stated Pain Goal: 5 (72/82/06 0156)  Complications: No apparent anesthesia complications

## 2018-12-04 NOTE — Op Note (Signed)
12/04/2018 [facility]                              OPERATIVE REPORT   PREOPERATIVE DIAGNOSIS:  Right carpal tunnel syndrome.  POSTOPERATIVE DIAGNOSIS:  Right carpal tunnel syndrome.  PROCEDURE:  Right carpal tunnel release.  SURGEON:  Betha Loa, MD  ASSISTANT:  none.  ANESTHESIA: Bier block with sedation  IV FLUIDS:  Per anesthesia flow sheet.  ESTIMATED BLOOD LOSS:  Minimal.  COMPLICATIONS:  None.  SPECIMENS:  None.  TOURNIQUET TIME:    Total Tourniquet Time Documented: Forearm (Right) - 45 minutes Total: Forearm (Right) - 45 minutes   DISPOSITION:  Stable to PACU.  LOCATION: [facility]  INDICATIONS:  43 yo male with numbness and tingling right hand.  Nocturnal symptoms.  Positive nerve conduction studies.   He wishes to have a carpal tunnel release for management of his symptoms.  Risks, benefits and alternatives of surgery were discussed including the risk of blood loss; infection; damage to nerves, vessels, tendons, ligaments, bone; failure of surgery; need for additional surgery; complications with wound healing; continued pain; recurrence of carpal tunnel syndrome; and damage to motor branch. He voiced understanding of these risks and elected to proceed.   OPERATIVE COURSE:  After being identified preoperatively by myself, the patient and I agreed upon the procedure and site of procedure.  The surgical site was marked.  The risks, benefits, and alternatives of the surgery were reviewed and he wished to proceed.  Surgical consent had been signed.  He was given IV Ancef as preoperative antibiotic prophylaxis.  He was transferred to the operating room and placed on the operating room table in supine position with the Right upper extremity on an armboard.  Bier block anesthesia was induced by the anesthesiologist.  Right upper extremity was prepped and draped in normal sterile orthopaedic fashion.  A surgical pause was performed between the  surgeons, anesthesia, and operating room staff, and all were in agreement as to the patient, procedure, and site of procedure.  Tourniquet at the proximal aspect of the forearm had been inflated for the Bier block  Incision was made over the transverse carpal ligament and carried into the subcutaneous tissues by spreading technique.  Bipolar electrocautery was used to obtain hemostasis.  The palmar fascia was sharply incised.  The transverse carpal ligament was identified and sharply incised.  It was incised distally first.  Care was taken to ensure complete decompression distally.  It was then incised proximally.  Scissors were used to split the distal aspect of the volar antebrachial fascia.  A finger was placed into the wound to ensure complete decompression.  There was still a tight band of tissue noted.  This is unable to be visualized through the wound.  The wound was extended to avoid injury to the nerve.  The tight band of volar antebrachial fascia was identified and released.  There was good decompression of the nerve.  The nerve was examined.  It was flattened and hyperemic. It was adherent to the radial leaflet.  The motor branch was identified and was intact.  The wound was copiously irrigated with sterile saline.  It was then closed with 4-0 nylon in a horizontal mattress fashion.  It was injected with 0.25% plain Marcaine to aid in postoperative analgesia.  It was dressed with sterile Xeroform, 4x4s, an ABD, and wrapped with Kerlix and an Ace bandage.  Tourniquet  was deflated at 45 minutes.  Fingertips were pink with brisk capillary refill after deflation of the tourniquet.  Operative drapes were broken down.  The patient was awoken from anesthesia safely.  He was transferred back to stretcher and taken to the PACU in stable condition.  I will see him back in the office in 1 week for postoperative followup.  I will give him a prescription for Norco 5/325 1-2 tabs PO q6 hours prn pain, dispense #  20.    [name], MD Electronically signed, 12/04/18

## 2018-12-04 NOTE — Anesthesia Postprocedure Evaluation (Signed)
Anesthesia Post Note  Patient: David Christian  Procedure(s) Performed: RIGHT CARPAL TUNNEL RELEASE (Right Wrist)     Anesthesia Post Evaluation  Last Vitals:  Vitals:   12/04/18 1445 12/04/18 1500  BP: 122/78 117/70  Pulse: 63 (!) 57  Resp: 13 16  Temp:  36.7 C  SpO2: 96% 98%    Last Pain:  Vitals:   12/04/18 1500  TempSrc:   PainSc: 0-No pain                 Nolon Nations

## 2018-12-04 NOTE — Discharge Instructions (Addendum)

## 2018-12-04 NOTE — Anesthesia Procedure Notes (Signed)
Anesthesia Regional Block: Bier block (IV Regional)   Pre-Anesthetic Checklist: ,, timeout performed, Correct Patient, Correct Site, Correct Laterality, Correct Procedure, Correct Position, site marked, Risks and benefits discussed,  Surgical consent,  Pre-op evaluation,  At surgeon's request and post-op pain management  Laterality: Right  Prep: chloraprep       Needles:  Injection technique: Single-shot      Needle Gauge: 20     Additional Needles:   Procedures:,,,,, intact distal pulses, Esmarch exsanguination, single tourniquet utilized, #20gu IV placed  Narrative:  Start time: 12/04/2018 1:24 PM End time: 12/04/2018 1:26 PM  Performed by: Personally  Anesthesiologist: Nolon Nations, MD CRNA: Genelle Bal, CRNA

## 2018-12-05 ENCOUNTER — Encounter (HOSPITAL_BASED_OUTPATIENT_CLINIC_OR_DEPARTMENT_OTHER): Payer: Self-pay | Admitting: Orthopedic Surgery

## 2018-12-07 ENCOUNTER — Telehealth: Payer: Self-pay | Admitting: *Deleted

## 2018-12-07 NOTE — Telephone Encounter (Signed)
Dr. Donnetta Hutching unable to get patient on the phone after multiple attempts. Asked to call patient after dr. Donnetta Hutching has reviewed films brought over to office by patient. Message left on voice mail for patient to call this office.

## 2018-12-07 NOTE — Telephone Encounter (Signed)
Spoke with patient and offered to schedule an appointment with Dr. Donnetta Hutching to discuss X-Rays. Patient declined at this time but asked for Radiology CD and possible follow up with PCP or ENT. Patient instructed to call me if he wishes to schedule appt with Dr. Donnetta Hutching.

## 2018-12-19 ENCOUNTER — Other Ambulatory Visit: Payer: Self-pay | Admitting: Neurology

## 2018-12-19 NOTE — Telephone Encounter (Signed)
Per vo by Dr. Krista Blue, he can increase his gabapentin 300mg  to one cap in am, one cap midday, two caps QHS.  He should also continue wearing his wrist splints at night.  He is in agreement with this plan.  New rx sent to the pharmacy reflecting new dosage.

## 2018-12-19 NOTE — Telephone Encounter (Signed)
Pt is asking for a call from RN to discuss an increase in the gabapentin (NEURONTIN) 300 MG capsule.  Pt states it takes the edge off but he is asking for a call to discuss options

## 2018-12-19 NOTE — Addendum Note (Signed)
Addended by: Noberto Retort C on: 12/19/2018 05:50 PM   Modules accepted: Orders

## 2018-12-19 NOTE — Telephone Encounter (Signed)
He is taking gabapentin 300mg , one capsule TID.  He is still having issues with pain, numbness, tingling and burning.  Symptoms are waking him up during the night.

## 2018-12-20 MED ORDER — GABAPENTIN 300 MG PO CAPS: ORAL_CAPSULE | ORAL | 11 refills | Status: DC

## 2018-12-20 NOTE — Addendum Note (Signed)
Addended by: Desmond Lope on: 12/20/2018 08:48 AM   Modules accepted: Orders

## 2019-01-02 ENCOUNTER — Other Ambulatory Visit: Payer: Self-pay | Admitting: Cardiovascular Disease

## 2019-01-05 ENCOUNTER — Encounter (HOSPITAL_BASED_OUTPATIENT_CLINIC_OR_DEPARTMENT_OTHER): Payer: Self-pay | Admitting: *Deleted

## 2019-01-05 ENCOUNTER — Other Ambulatory Visit: Payer: Self-pay

## 2019-01-08 ENCOUNTER — Other Ambulatory Visit (HOSPITAL_COMMUNITY)
Admission: RE | Admit: 2019-01-08 | Discharge: 2019-01-08 | Disposition: A | Discharge status: Home / Self Care | Payer: Managed Care, Other (non HMO) | Source: Ambulatory Visit | Attending: Orthopedic Surgery | Admitting: Orthopedic Surgery

## 2019-01-08 ENCOUNTER — Other Ambulatory Visit: Payer: Self-pay

## 2019-01-08 DIAGNOSIS — E782 Mixed hyperlipidemia: Secondary | ICD-10-CM

## 2019-01-08 DIAGNOSIS — Z20828 Contact with and (suspected) exposure to other viral communicable diseases: Secondary | ICD-10-CM | POA: Diagnosis not present

## 2019-01-08 DIAGNOSIS — Z01812 Encounter for preprocedural laboratory examination: Secondary | ICD-10-CM | POA: Insufficient documentation

## 2019-01-08 NOTE — Progress Notes (Signed)

## 2019-01-09 ENCOUNTER — Other Ambulatory Visit: Payer: Self-pay | Admitting: Orthopedic Surgery

## 2019-01-09 LAB — NOVEL CORONAVIRUS, NAA (HOSP ORDER, SEND-OUT TO REF LAB; TAT 18-24 HRS): SARS-CoV-2, NAA: NOT DETECTED

## 2019-01-11 ENCOUNTER — Other Ambulatory Visit: Payer: Self-pay

## 2019-01-11 ENCOUNTER — Ambulatory Visit (HOSPITAL_BASED_OUTPATIENT_CLINIC_OR_DEPARTMENT_OTHER): Payer: Managed Care, Other (non HMO) | Admitting: Anesthesiology

## 2019-01-11 ENCOUNTER — Encounter (HOSPITAL_BASED_OUTPATIENT_CLINIC_OR_DEPARTMENT_OTHER): Payer: Self-pay | Admitting: Emergency Medicine

## 2019-01-11 ENCOUNTER — Ambulatory Visit (HOSPITAL_BASED_OUTPATIENT_CLINIC_OR_DEPARTMENT_OTHER)
Admission: RE | Admit: 2019-01-11 | Discharge: 2019-01-11 | Disposition: A | Discharge status: Home / Self Care | Payer: Managed Care, Other (non HMO) | Attending: Orthopedic Surgery | Admitting: Orthopedic Surgery

## 2019-01-11 ENCOUNTER — Encounter (HOSPITAL_BASED_OUTPATIENT_CLINIC_OR_DEPARTMENT_OTHER)
Admission: RE | Disposition: A | Discharge status: Home / Self Care | Payer: Self-pay | Source: Home / Self Care | Attending: Orthopedic Surgery

## 2019-01-11 DIAGNOSIS — G5602 Carpal tunnel syndrome, left upper limb: Secondary | ICD-10-CM | POA: Insufficient documentation

## 2019-01-11 DIAGNOSIS — Z951 Presence of aortocoronary bypass graft: Secondary | ICD-10-CM | POA: Diagnosis not present

## 2019-01-11 DIAGNOSIS — Z87891 Personal history of nicotine dependence: Secondary | ICD-10-CM | POA: Insufficient documentation

## 2019-01-11 DIAGNOSIS — I2510 Atherosclerotic heart disease of native coronary artery without angina pectoris: Secondary | ICD-10-CM | POA: Diagnosis not present

## 2019-01-11 DIAGNOSIS — K219 Gastro-esophageal reflux disease without esophagitis: Secondary | ICD-10-CM | POA: Insufficient documentation

## 2019-01-11 DIAGNOSIS — I10 Essential (primary) hypertension: Secondary | ICD-10-CM | POA: Diagnosis not present

## 2019-01-11 DIAGNOSIS — G4730 Sleep apnea, unspecified: Secondary | ICD-10-CM | POA: Insufficient documentation

## 2019-01-11 HISTORY — PX: CARPAL TUNNEL RELEASE: SHX101

## 2019-01-11 SURGERY — CARPAL TUNNEL RELEASE
Anesthesia: Regional | Site: Hand | Laterality: Left

## 2019-01-11 MED ORDER — CEFAZOLIN SODIUM-DEXTROSE 2-4 GM/100ML-% IV SOLN
INTRAVENOUS | Status: AC
  Filled 2019-01-11: qty 100

## 2019-01-11 MED ORDER — LACTATED RINGERS IV SOLN
INTRAVENOUS | Status: DC
  Administered 2019-01-11: 11:00:00 via INTRAVENOUS

## 2019-01-11 MED ORDER — OXYCODONE HCL 5 MG PO TABS
5.0000 mg | ORAL_TABLET | Freq: Once | ORAL | Status: AC
  Administered 2019-01-11: 13:00:00 5 mg via ORAL

## 2019-01-11 MED ORDER — OXYCODONE HCL 5 MG PO TABS
ORAL_TABLET | ORAL | Status: AC
  Filled 2019-01-11: qty 1

## 2019-01-11 MED ORDER — FENTANYL CITRATE (PF) 100 MCG/2ML IJ SOLN
INTRAMUSCULAR | Status: DC | PRN
  Administered 2019-01-11: 100 ug via INTRAVENOUS

## 2019-01-11 MED ORDER — ONDANSETRON HCL 4 MG/2ML IJ SOLN
INTRAMUSCULAR | Status: AC
  Filled 2019-01-11: qty 2

## 2019-01-11 MED ORDER — ONDANSETRON HCL 4 MG/2ML IJ SOLN: 4.0000 mg | Freq: Once | INTRAMUSCULAR | Status: DC | PRN

## 2019-01-11 MED ORDER — MEPERIDINE HCL 25 MG/ML IJ SOLN: 6.2500 mg | INTRAMUSCULAR | Status: DC | PRN

## 2019-01-11 MED ORDER — PROPOFOL 10 MG/ML IV BOLUS
INTRAVENOUS | Status: DC | PRN
  Administered 2019-01-11: 20 mg via INTRAVENOUS

## 2019-01-11 MED ORDER — MIDAZOLAM HCL 5 MG/5ML IJ SOLN
INTRAMUSCULAR | Status: DC | PRN
  Administered 2019-01-11: 2 mg via INTRAVENOUS

## 2019-01-11 MED ORDER — HYDROMORPHONE HCL 1 MG/ML IJ SOLN: 0.2500 mg | INTRAMUSCULAR | Status: DC | PRN

## 2019-01-11 MED ORDER — PROPOFOL 500 MG/50ML IV EMUL
INTRAVENOUS | Status: DC | PRN
  Administered 2019-01-11: 100 ug/kg/min via INTRAVENOUS

## 2019-01-11 MED ORDER — CEFAZOLIN SODIUM-DEXTROSE 1-4 GM/50ML-% IV SOLN
INTRAVENOUS | Status: AC
  Filled 2019-01-11: qty 50

## 2019-01-11 MED ORDER — CHLORHEXIDINE GLUCONATE 4 % EX LIQD: 60.0000 mL | Freq: Once | CUTANEOUS | Status: DC

## 2019-01-11 MED ORDER — HYDROCODONE-ACETAMINOPHEN 5-325 MG PO TABS: ORAL_TABLET | ORAL | 0 refills | Status: DC

## 2019-01-11 MED ORDER — MIDAZOLAM HCL 2 MG/2ML IJ SOLN
INTRAMUSCULAR | Status: AC
  Filled 2019-01-11: qty 2

## 2019-01-11 MED ORDER — CEFAZOLIN SODIUM-DEXTROSE 2-4 GM/100ML-% IV SOLN
2.0000 g | INTRAVENOUS | Status: AC
  Administered 2019-01-11: 11:00:00 3 g via INTRAVENOUS

## 2019-01-11 MED ORDER — BUPIVACAINE HCL (PF) 0.25 % IJ SOLN
INTRAMUSCULAR | Status: DC | PRN
  Administered 2019-01-11: 10 mL

## 2019-01-11 MED ORDER — FENTANYL CITRATE (PF) 100 MCG/2ML IJ SOLN
INTRAMUSCULAR | Status: AC
  Filled 2019-01-11: qty 2

## 2019-01-11 SURGICAL SUPPLY — 40 items
APL PRP STRL LF DISP 70% ISPRP (MISCELLANEOUS) ×1
BLADE SURG 15 STRL LF DISP TIS (BLADE) ×2 IMPLANT
BLADE SURG 15 STRL SS (BLADE) ×6
BNDG CMPR 9X4 STRL LF SNTH (GAUZE/BANDAGES/DRESSINGS)
BNDG ELASTIC 3X5.8 VLCR STR LF (GAUZE/BANDAGES/DRESSINGS) ×3 IMPLANT
BNDG ESMARK 4X9 LF (GAUZE/BANDAGES/DRESSINGS) IMPLANT
BNDG GAUZE ELAST 4 BULKY (GAUZE/BANDAGES/DRESSINGS) ×3 IMPLANT
CHLORAPREP W/TINT 26 (MISCELLANEOUS) ×3 IMPLANT
CORD BIPOLAR FORCEPS 12FT (ELECTRODE) ×3 IMPLANT
COVER BACK TABLE REUSABLE LG (DRAPES) ×3 IMPLANT
COVER MAYO STAND REUSABLE (DRAPES) ×3 IMPLANT
COVER WAND RF STERILE (DRAPES) IMPLANT
CUFF TOURN SGL QUICK 18X4 (TOURNIQUET CUFF) ×3 IMPLANT
DRAPE EXTREMITY T 121X128X90 (DISPOSABLE) ×3 IMPLANT
DRAPE SURG 17X23 STRL (DRAPES) ×3 IMPLANT
DRSG PAD ABDOMINAL 8X10 ST (GAUZE/BANDAGES/DRESSINGS) ×3 IMPLANT
GAUZE SPONGE 4X4 12PLY STRL (GAUZE/BANDAGES/DRESSINGS) ×3 IMPLANT
GAUZE XEROFORM 1X8 LF (GAUZE/BANDAGES/DRESSINGS) ×3 IMPLANT
GLOVE BIO SURGEON STRL SZ7.5 (GLOVE) ×3 IMPLANT
GLOVE BIOGEL PI IND STRL 6.5 (GLOVE) IMPLANT
GLOVE BIOGEL PI IND STRL 8 (GLOVE) ×1 IMPLANT
GLOVE BIOGEL PI INDICATOR 6.5 (GLOVE) ×2
GLOVE BIOGEL PI INDICATOR 8 (GLOVE) ×2
GLOVE ECLIPSE 6.5 STRL STRAW (GLOVE) ×2 IMPLANT
GLOVE EXAM NITRILE MD LF STRL (GLOVE) ×2 IMPLANT
GOWN STRL REUS W/ TWL LRG LVL3 (GOWN DISPOSABLE) ×1 IMPLANT
GOWN STRL REUS W/TWL LRG LVL3 (GOWN DISPOSABLE) ×3
GOWN STRL REUS W/TWL XL LVL3 (GOWN DISPOSABLE) ×3 IMPLANT
NDL HYPO 25X1 1.5 SAFETY (NEEDLE) ×1 IMPLANT
NEEDLE HYPO 25X1 1.5 SAFETY (NEEDLE) ×3 IMPLANT
NS IRRIG 1000ML POUR BTL (IV SOLUTION) ×3 IMPLANT
PACK BASIN DAY SURGERY FS (CUSTOM PROCEDURE TRAY) ×3 IMPLANT
PADDING CAST ABS 4INX4YD NS (CAST SUPPLIES) ×2
PADDING CAST ABS COTTON 4X4 ST (CAST SUPPLIES) ×1 IMPLANT
STOCKINETTE 4X48 STRL (DRAPES) ×3 IMPLANT
SUT ETHILON 4 0 PS 2 18 (SUTURE) ×3 IMPLANT
SYR BULB 3OZ (MISCELLANEOUS) ×3 IMPLANT
SYR CONTROL 10ML LL (SYRINGE) ×3 IMPLANT
TOWEL GREEN STERILE FF (TOWEL DISPOSABLE) ×6 IMPLANT
UNDERPAD 30X36 HEAVY ABSORB (UNDERPADS AND DIAPERS) ×3 IMPLANT

## 2019-01-11 NOTE — Discharge Instructions (Addendum)

## 2019-01-11 NOTE — H&P (Signed)
David GRIEGER is an 43 y.o. male.   Chief Complaint: left carpal tunnel syndrome HPI: 43 yo male with numbness and tingling in left hand.  Nocturnal symptoms.  Positive nerve conduction studies.  He wishes to have left carpal tunnel release.  Allergies: No Known Allergies  Past Medical History:  Diagnosis Date  . Anginal pain (HCC)    pt. denies  . Coronary artery disease   . GERD (gastroesophageal reflux disease)   . Hyperlipidemia   . Hypertension   . Neck pain   . Sleep apnea    uses CPAP    Past Surgical History:  Procedure Laterality Date  . CARDIAC CATHETERIZATION    . CARPAL TUNNEL RELEASE Right 12/04/2018   Procedure: RIGHT CARPAL TUNNEL RELEASE;  Surgeon: Betha Loa, MD;  Location: [facility];  Service: Orthopedics;  Laterality: Right;  . CORONARY ARTERY BYPASS GRAFT N/A 05/04/2018   Procedure: CORONARY ARTERY BYPASS GRAFTING (CABG) x5 using right greater saphenous vein harvested endoscopically and bilateral left internal mammary arteries;  Surgeon: [name], MD;  Location: [facility] OR;  Service: Open Heart Surgery;  Laterality: N/A;  possible bilateral IMA  . ELBOW SURGERY    . ENDARTERECTOMY Right 08/11/2018   Procedure: right carotid ENDARTERECTOMY;  Surgeon: [name], MD;  Location[facility] OR;  Service: Vascular;  Laterality: Right;  . HAND SURGERY    . LEFT HEART CATH AND CORONARY ANGIOGRAPHY N/A 05/03/2018   Procedure: LEFT HEART CATH AND CORONARY ANGIOGRAPHY;  Surgeon: [name], MD;  Location: [facility] INVASIVE CV LAB;  Service: Cardiovascular;  Laterality: N/A;  . MASS EXCISION Right 10/31/2014   Procedure: EXCISION ANTERIOR NECK & RIGHT SUBMANDIBULAR CYSTS WITH COMPLEX CLOSURES;  Surgeon: [name], MD;  Location: [facility];  Service: ENT;  Laterality: Right;  CPAP  . PATCH ANGIOPLASTY Right 08/11/2018   Procedure: Patch Angioplasty of right carotid artery using hemashield platinum finesse patch;  Surgeon: [name], MD;   Location: [facility] OR;  Service: Vascular;  Laterality: Right;  . TEE WITHOUT CARDIOVERSION N/A 05/04/2018   Procedure: TRANSESOPHAGEAL ECHOCARDIOGRAM (TEE);  Surgeon: [name], MD;  Location: [facility] OR;  Service: Open Heart Surgery;  Laterality: N/A;    Family History: Family History  Problem Relation Age of Onset  . Healthy Mother   . Healthy Father   . Arthritis Maternal Grandfather   . Arthritis Paternal Grandmother   . Diabetes Neg Hx   . Heart disease Neg Hx   . Stroke Neg Hx     Social History:   reports that he quit smoking about 19 years ago. He quit after 6.00 years of use. He quit smokeless tobacco use about 17 months ago.  His smokeless tobacco use included chew. He reports current alcohol use of about 1.0 standard drinks of alcohol per week. He reports that he does not use drugs.  Medications: No medications prior to admission.    No results found for this or any previous visit (from the past 48 hour(s)).  No results found.   A comprehensive review of systems was negative.  Height 6' (1.829 m), weight 119 kg.  General appearance: alert, cooperative and appears stated age Head: Normocephalic, without obvious abnormality, atraumatic Neck: supple, symmetrical, trachea midline Cardio: regular rate and rhythm Resp: clear to auscultation bilaterally Extremities: Intact sensation and capillary refill all digits but left fingertips feel different.  +epl/fpl/io.  No wounds.  Pulses: 2+ and symmetric Skin: Skin color, texture, turgor normal.  No rashes or lesions Neurologic: Grossly normal Incision/Wound: none  Assessment/Plan Left carpal tunnel syndrome.  Non operative and operative treatment options have been discussed with the patient and patient wishes to proceed with operative treatment. Risks, benefits, and alternatives of surgery have been discussed and the patient agrees with the plan of care.   Leanora Christian 01/11/2019, 8:47 AM

## 2019-01-11 NOTE — Transfer of Care (Signed)
Immediate Anesthesia Transfer of Care Note  Patient: David Christian  Procedure(s) Performed: LEFT CARPAL TUNNEL RELEASE (Left Hand)  Patient Location: PACU  Anesthesia Type:General and Bier block  Level of Consciousness: awake, alert  and oriented  Airway & Oxygen Therapy: Patient Spontanous Breathing and Patient connected to face mask oxygen  Post-op Assessment: Report given to RN and Post -op Vital signs reviewed and stable  Post vital signs: Reviewed and stable  Last Vitals:  Vitals Value Taken Time  BP    Temp    Pulse 78 01/11/19 1150  Resp    SpO2 98 % 01/11/19 1150  Vitals shown include unvalidated device data.  Last Pain:  Vitals:   01/11/19 0923  TempSrc: Oral  PainSc: 5          Complications: No apparent anesthesia complications

## 2019-01-11 NOTE — Anesthesia Postprocedure Evaluation (Signed)
Anesthesia Post Note  Patient: David Christian  Procedure(s) Performed: LEFT CARPAL TUNNEL RELEASE (Left Hand)     Patient location during evaluation: PACU Anesthesia Type: Bier Block Level of consciousness: awake and alert Pain management: pain level controlled Vital Signs Assessment: post-procedure vital signs reviewed and stable Respiratory status: spontaneous breathing, nonlabored ventilation, respiratory function stable and patient connected to nasal cannula oxygen Cardiovascular status: stable and blood pressure returned to baseline Postop Assessment: no apparent nausea or vomiting Anesthetic complications: no    Last Vitals:  Vitals:   01/11/19 1215 01/11/19 1245  BP: 104/77 127/83  Pulse: 66 63  Resp: 14 16  Temp:  36.6 C  SpO2: 96% 99%    Last Pain:  Vitals:   01/11/19 1300  TempSrc:   PainSc: 3                  Merwyn Hodapp DAVID

## 2019-01-11 NOTE — Anesthesia Procedure Notes (Signed)
Anesthesia Regional Block: Bier block (IV Regional)   Pre-Anesthetic Checklist: ,, timeout performed, Correct Patient, Correct Site, Correct Laterality, Correct Procedure,, site marked, surgical consent,, at surgeon's request  Laterality: Left     Needles:  Injection technique: Single-shot  Needle Type: Other   (20 ga IV cath)    Needle Gauge: 20     Additional Needles:   Procedures:,,,,, intact distal pulses, Esmarch exsanguination, single tourniquet utilized,  Narrative:  Start time: 01/11/2019 11:19 AM End time: 01/11/2019 11:19 AM  Performed by: Personally

## 2019-01-11 NOTE — Op Note (Signed)
01/11/2019 [facility]                              OPERATIVE REPORT   PREOPERATIVE DIAGNOSIS:  Left carpal tunnel syndrome.  POSTOPERATIVE DIAGNOSIS:  Left carpal tunnel syndrome.  PROCEDURE:  Left carpal tunnel release.  SURGEON:  Betha Loa, MD  ASSISTANT:  none.  ANESTHESIA: Bier block with sedation  IV FLUIDS:  Per anesthesia flow sheet.  ESTIMATED BLOOD LOSS:  Minimal.  COMPLICATIONS:  None.  SPECIMENS:  None.  TOURNIQUET TIME:    Total Tourniquet Time Documented: Forearm (Left) - 27 minutes Total: Forearm (Left) - 27 minutes   DISPOSITION:  Stable to PACU.  LOCATION: [facility]  INDICATIONS:  43 yo male with numbness and tingling left hand.  Positive nerve conduction studies.  He wishes to have a carpal tunnel release for management of his symptoms.  Risks, benefits and alternatives of surgery were discussed including the risk of blood loss; infection; damage to nerves, vessels, tendons, ligaments, bone; failure of surgery; need for additional surgery; complications with wound healing; continued pain; recurrence of carpal tunnel syndrome; and damage to motor branch. He voiced understanding of these risks and elected to proceed.   OPERATIVE COURSE:  After being identified preoperatively by myself, the patient and I agreed upon the procedure and site of procedure.  The surgical site was marked.  The risks, benefits, and alternatives of the surgery were reviewed and he wished to proceed.  Surgical consent had been signed.  He was given IV Ancef as preoperative antibiotic prophylaxis.  He was transferred to the operating room and placed on the operating room table in supine position with the Left upper extremity on an armboard.  Bier block anesthesia was induced by the anesthesiologist.  Left upper extremity was prepped and draped in normal sterile orthopaedic fashion.  A surgical pause was performed between the surgeons, anesthesia, and  operating room staff, and all were in agreement as to the patient, procedure, and site of procedure.  Tourniquet at the proximal aspect of the forearm had been inflated for the Bier block  Incision was made over the transverse carpal ligament and carried into the subcutaneous tissues by spreading technique.  Bipolar electrocautery was used to obtain hemostasis.  The palmar fascia was sharply incised.  The transverse carpal ligament was identified and sharply incised.  It was incised distally first.  Care was taken to ensure complete decompression distally.  It was then incised proximally.  Scissors were used to split the distal aspect of the volar antebrachial fascia.  A finger was placed into the wound to ensure complete decompression, which was the case.  The nerve was examined.  It was flattened and hyperemic. and It was adherent to the radial leaflet.  The motor branch was identified and was intact.  The wound was copiously irrigated with sterile saline.  It was then closed with 4-0 nylon in a horizontal mattress fashion.  It was injected with 0.25% plain Marcaine to aid in postoperative analgesia.  It was dressed with sterile Xeroform, 4x4s, an ABD, and wrapped with Kerlix and an Ace bandage.  Tourniquet was deflated at 27 minutes.  Fingertips were pink with brisk capillary refill after deflation of the tourniquet.  Operative drapes were broken down.  The patient was awoken from anesthesia safely.  He was transferred back to stretcher and taken to the PACU in stable condition.  I will  see him back in the office in 1 week for postoperative followup.  I will give him a prescription for Norco 5/325 1-2 tabs PO q6 hours prn pain, dispense # 20.    [name], MD Electronically signed, 01/11/19

## 2019-01-11 NOTE — Anesthesia Preprocedure Evaluation (Signed)
Anesthesia Evaluation  Patient identified by MRN, date of birth, ID band Patient awake    Reviewed: Allergy & Precautions, NPO status , Patient's Chart, lab work & pertinent test results  Airway Mallampati: I  TM Distance: >3 FB Neck ROM: Full    Dental   Pulmonary sleep apnea , former smoker,    Pulmonary exam normal        Cardiovascular hypertension, Pt. on medications + CAD and + CABG  Normal cardiovascular exam     Neuro/Psych    GI/Hepatic GERD  Medicated and Controlled,  Endo/Other    Renal/GU      Musculoskeletal   Abdominal   Peds  Hematology   Anesthesia Other Findings   Reproductive/Obstetrics                             Anesthesia Physical Anesthesia Plan  ASA: III  Anesthesia Plan: Bier Block and Bier Block-LIDOCAINE ONLY   Post-op Pain Management:    Induction: Intravenous  PONV Risk Score and Plan: 1 and Ondansetron  Airway Management Planned: Nasal Cannula  Additional Equipment:   Intra-op Plan:   Post-operative Plan:   Informed Consent: I have reviewed the patients History and Physical, chart, labs and discussed the procedure including the risks, benefits and alternatives for the proposed anesthesia with the patient or authorized representative who has indicated his/her understanding and acceptance.       Plan Discussed with: CRNA and Surgeon  Anesthesia Plan Comments:         Anesthesia Quick Evaluation

## 2019-01-12 ENCOUNTER — Encounter (HOSPITAL_BASED_OUTPATIENT_CLINIC_OR_DEPARTMENT_OTHER): Payer: Self-pay | Admitting: Orthopedic Surgery

## 2019-03-22 ENCOUNTER — Telehealth: Payer: Self-pay | Admitting: Cardiovascular Disease

## 2019-03-22 NOTE — Telephone Encounter (Signed)
Patient states he has an Office manager net form for Repatha that he never turned in. He says he took the last dose yesterday and would like to know if he should fax it to [place].

## 2019-03-22 NOTE — Telephone Encounter (Signed)
CALLED and lmomed the pt stating that the repatha should be good to keep getting from [employer] and to call them to request a refill. Instructed the pt to call us back if more issues arise

## 2019-03-26 ENCOUNTER — Telehealth: Payer: Self-pay

## 2019-03-26 NOTE — Telephone Encounter (Signed)
Called pt and spoke w/them about the repatha approved but they mentioned that they are still getting it free from [employer]. I instructed the pt to get it free until they can't and then we will send the rx to the pharmacy and go from there. Pt voiced understanding

## 2019-03-26 NOTE — Telephone Encounter (Signed)
lmomed the pt and instructed him to call us back to see if he is having anymore issues getting the repatha free from the manufacturer

## 2019-04-18 IMAGING — DX DG CHEST 1V PORT
1 series · 1 of 1 positions shown · non-contrast
Comparison: No recent prior.

CLINICAL DATA: CABG.

EXAM:
PORTABLE CHEST 1 VIEW

[chest]
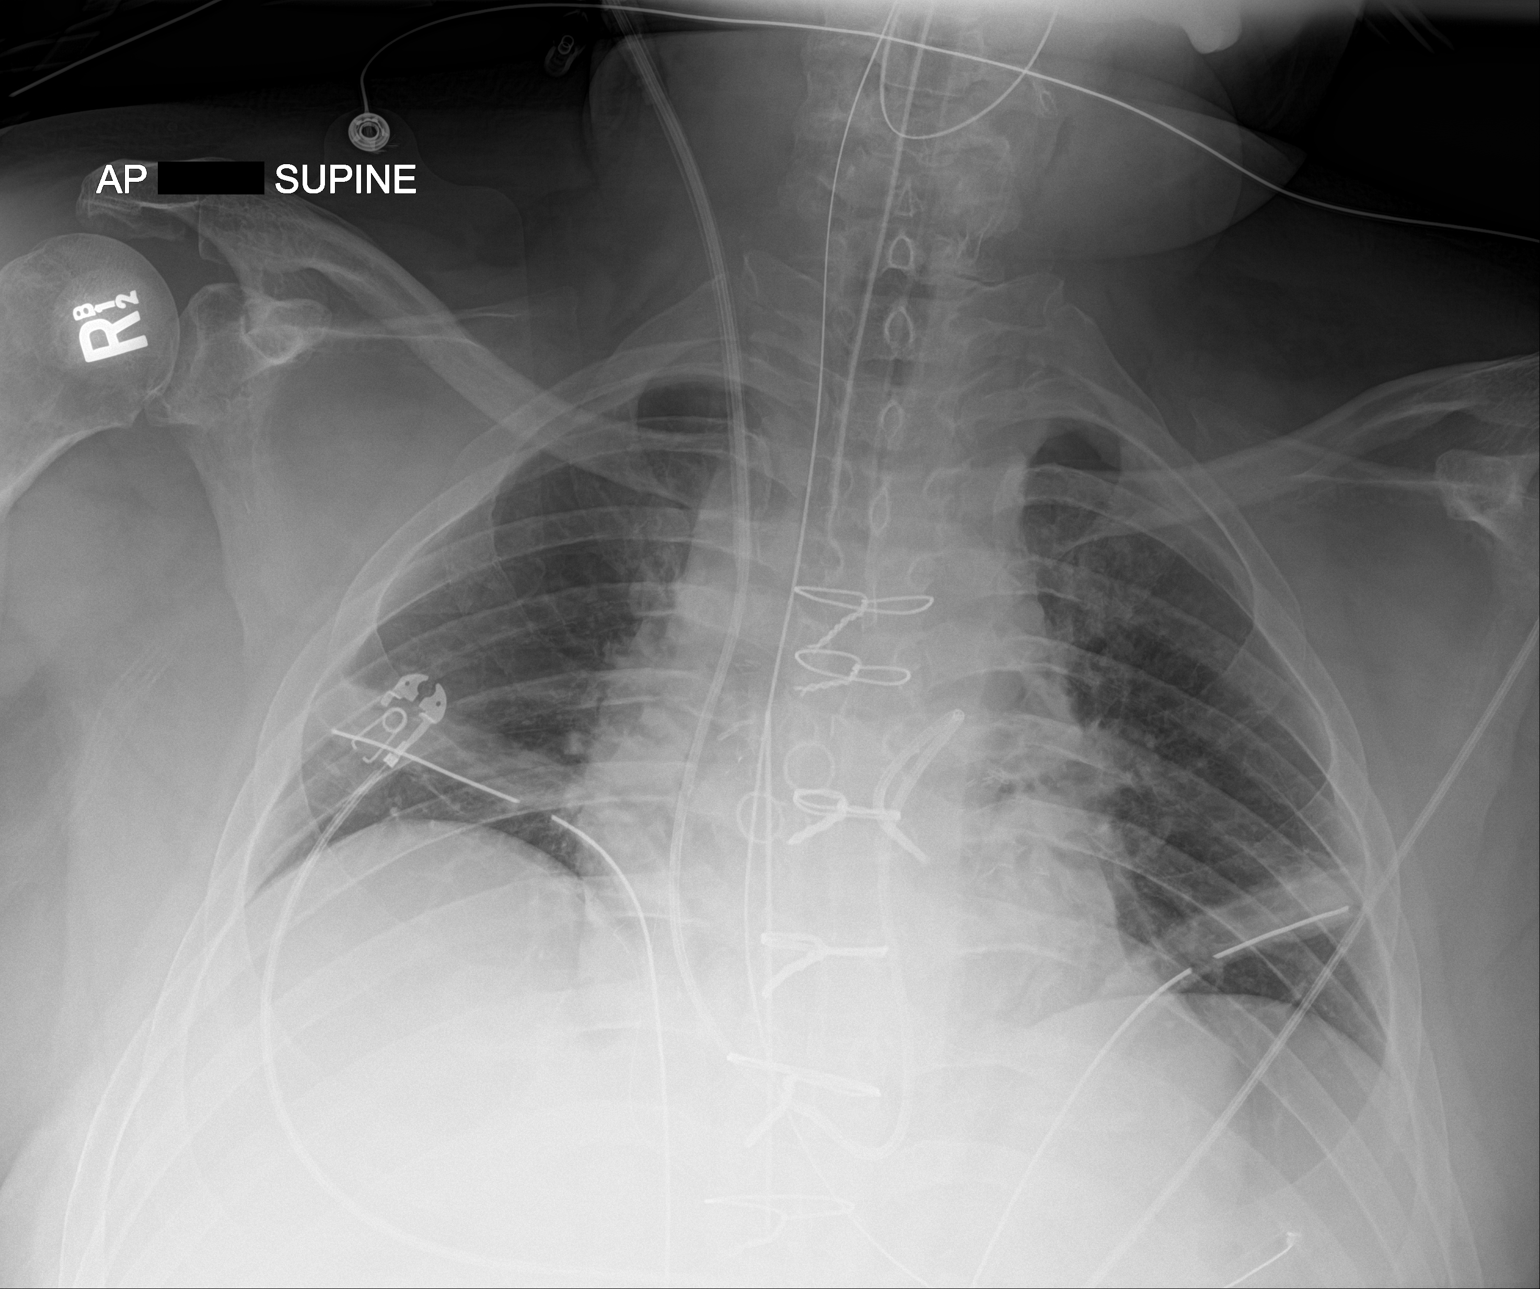

[1 of 1 positions shown; findings below may reference images not displayed]

FINDINGS: Endotracheal tube tip 2.9 cm above the lower portion of the carina.
NG tube tip projected over the stomach. Mediastinal drainage
catheter noted over the mid mediastinum. Swan-Ganz catheter noted
coiled over the pulmonary outflow tract. The tip be in the pulmonary
outflow tract or in a left lower lobe pulmonary artery branch.
Clinical correlation suggested. Bilateral chest tubes are noted over
the lower chest. No pneumothorax. Prior CABG. Heart size stable. Low
lung volumes with mild bibasilar atelectasis.
IMPRESSION: 1. Lines and tubes including bilateral chest tubes noted as above.
No pneumothorax. It should be noted that the Swan-Ganz catheter tip
is coiled over the pulmonary outflow tract. The tip may be within
the pulmonary outflow tract or in a left lower pulmonary artery
branch. Clinical correlation suggested.

2.  Prior CABG.  Heart size stable.

3.  Mild bibasilar atelectasis.

## 2019-04-21 IMAGING — DX DG CHEST 2V
2 series · 2 of 2 positions shown · non-contrast
Comparison: 05/06/2018

CLINICAL DATA: Reason for exam: Postop check. CABG surgery done on
05/04/18,[REDACTED]. Slight chest discomfort per pt area of surgery. Hx
of HTN, CAD.

EXAM:
CHEST - 2 VIEW

[chest pa]
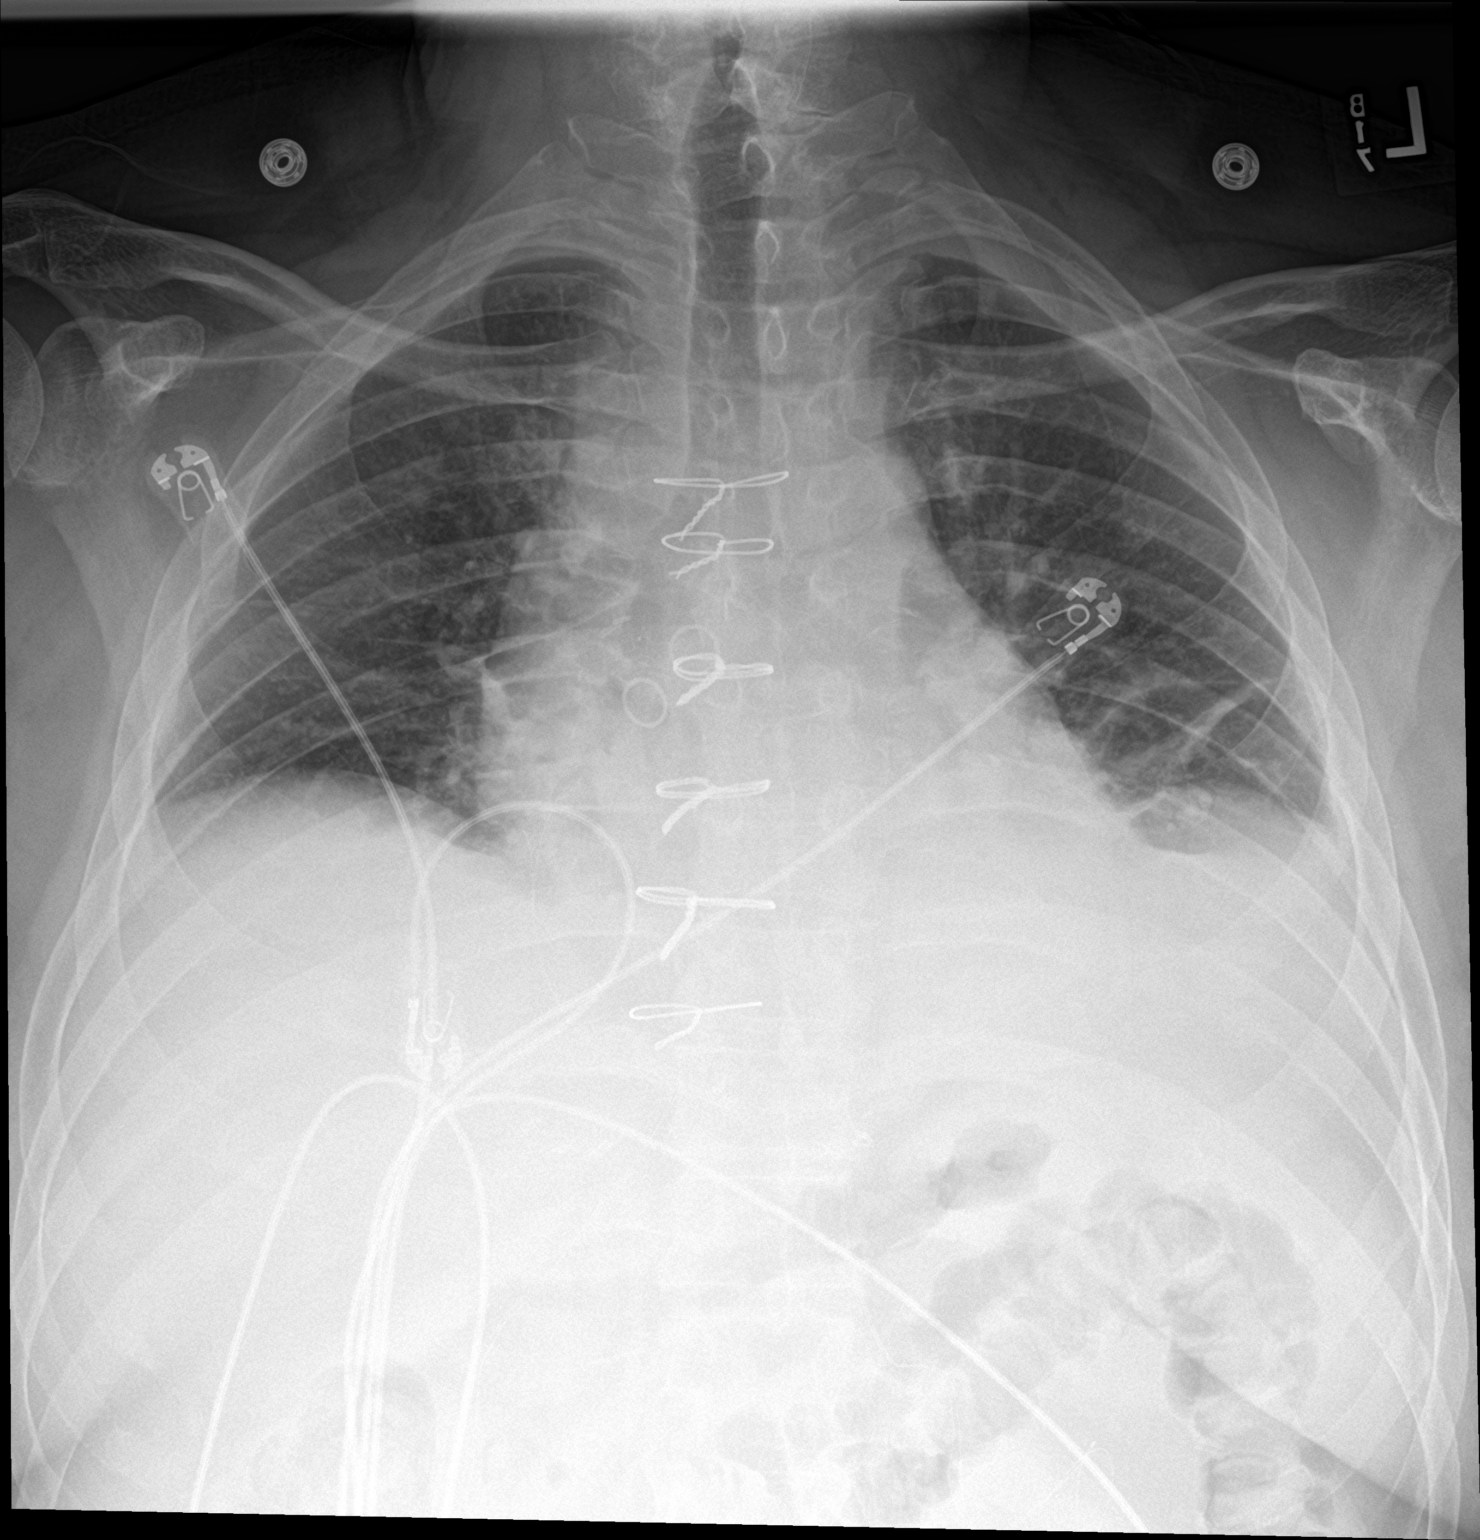

[chest lat]
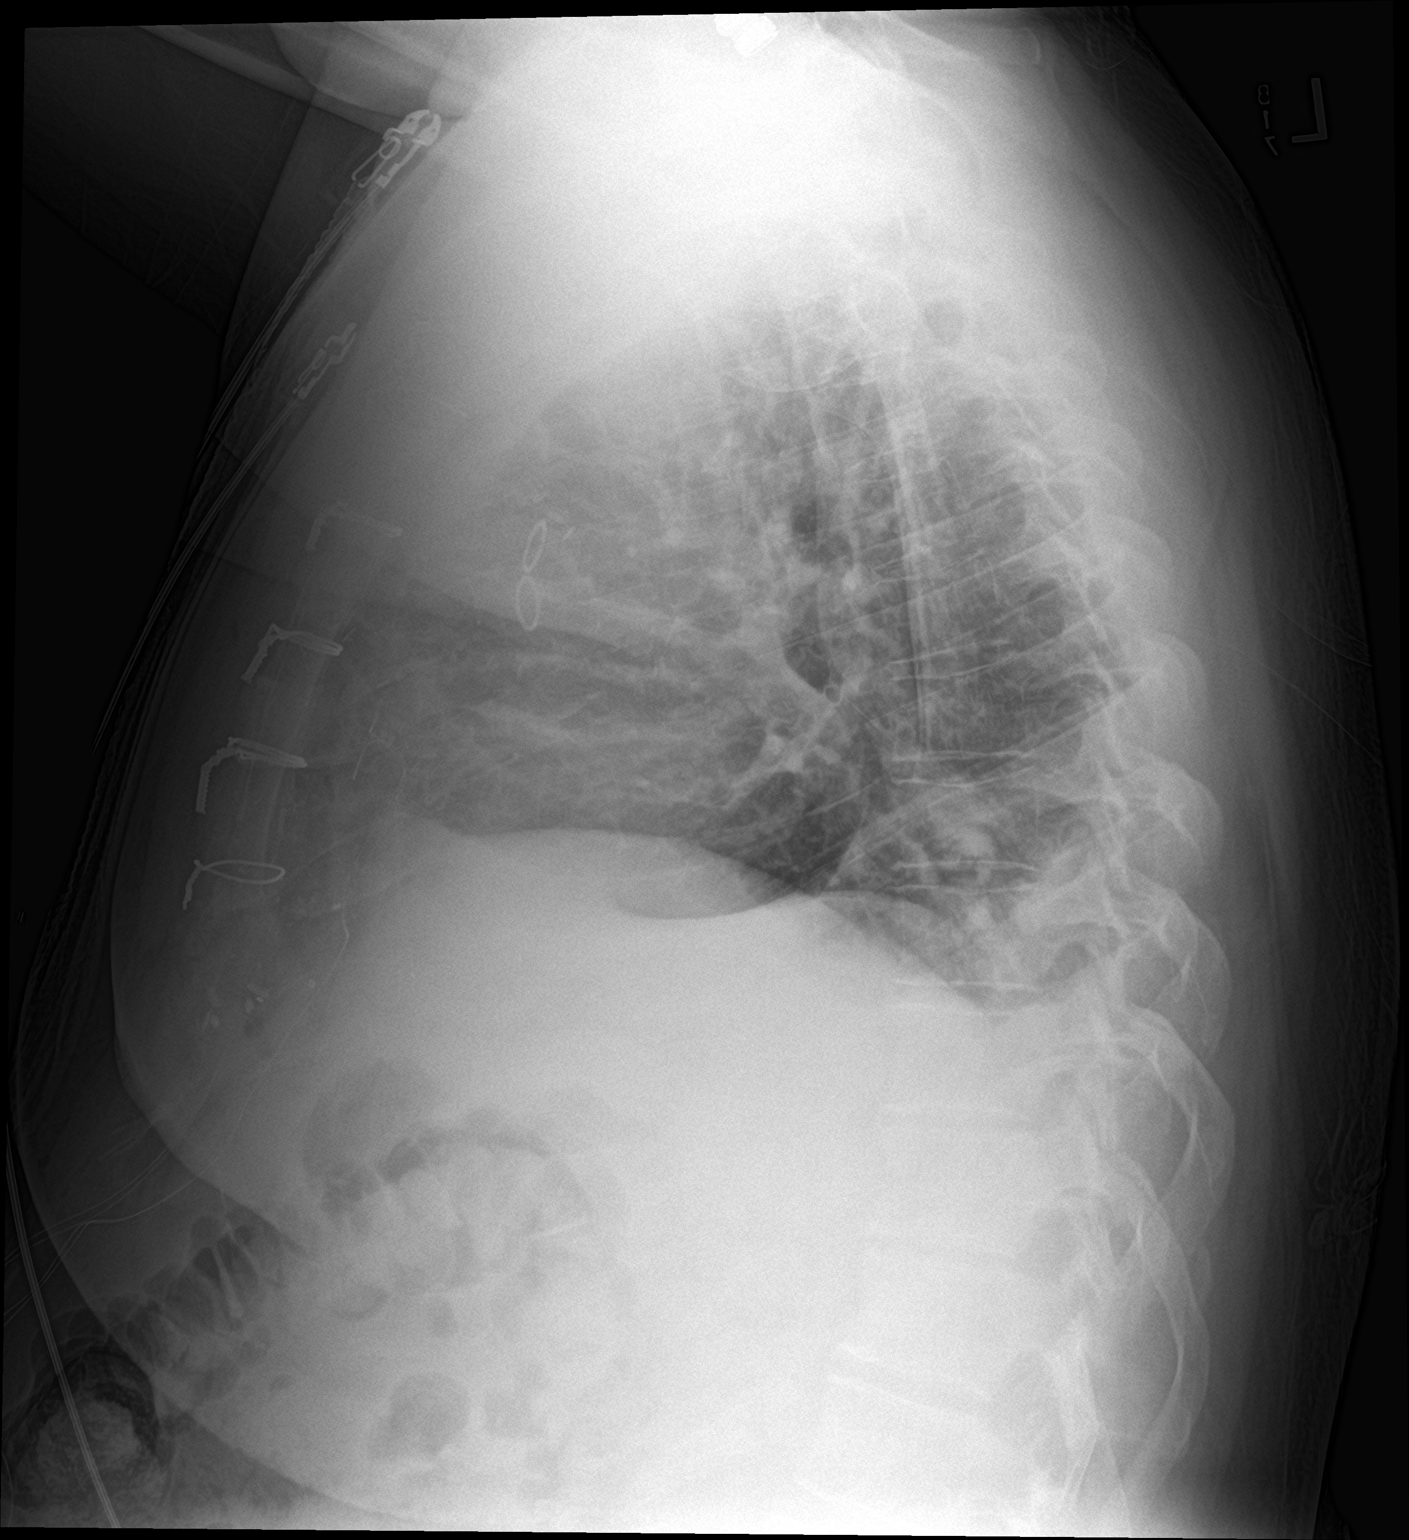

[2 of 2 positions shown; findings below may reference images not displayed]

FINDINGS: Low lung volumes. Some increase in central pulmonary vascular
congestion. Linear atelectasis or scarring in the lung bases as
before, left greater than right. Left infrahilar
consolidation/atelectasis. Small pleural effusions.

Heart size and mediastinal contours are within normal limits.
Previous CABG.

No pneumothorax.

Sternotomy wires.
IMPRESSION: 1. Low volumes with some increase in central pulmonary vascular
congestion.
2. Small pleural effusions.

## 2019-07-01 ENCOUNTER — Other Ambulatory Visit: Payer: Self-pay | Admitting: Cardiovascular Disease

## 2019-09-25 ENCOUNTER — Other Ambulatory Visit: Payer: Self-pay

## 2019-09-25 MED ORDER — METOPROLOL TARTRATE 25 MG PO TABS: 25.0000 mg | ORAL_TABLET | Freq: Two times a day (BID) | ORAL | 0 refills | Status: DC

## 2019-12-11 ENCOUNTER — Telehealth: Payer: Self-pay | Admitting: Cardiovascular Disease

## 2019-12-11 NOTE — Telephone Encounter (Signed)
lvm for patient to return call to get follow up scheduled with [employer] from recall list 

## 2019-12-26 ENCOUNTER — Other Ambulatory Visit: Payer: Self-pay | Admitting: Cardiovascular Disease

## 2019-12-27 DIAGNOSIS — I10 Essential (primary) hypertension: Secondary | ICD-10-CM | POA: Diagnosis not present

## 2019-12-27 DIAGNOSIS — Z79899 Other long term (current) drug therapy: Secondary | ICD-10-CM | POA: Diagnosis not present

## 2019-12-27 DIAGNOSIS — E7801 Familial hypercholesterolemia: Secondary | ICD-10-CM | POA: Diagnosis not present

## 2019-12-30 NOTE — Progress Notes (Signed)
Cardiology Office Note   Date:  01/01/2020   ID:  David Christian, DOB 12/11/1975, MRN [id number]  PCP:  Nonnie DoneSlatosky, John J., MD  Cardiologist:  Thurmon FairMihai Croitoru, MD EP: None  Chief Complaint  Patient presents with  . Follow-up    CAD      History of Present Illness: David Christian is a 44 y.o. male with a PMH of CAD s/p CABG 05/2018, carotid artery disease s/p R CEA 08/2018, HTN, HLD, OSA on CPAP, obesity, and former smoker, who presents for routine follow-up.  He was last evaluated by cardiology at an outpatient visit with Dr. Royann Shiversroitoru 10/2018, at which time he was doing well from a cardiac standpoint with increased exercise capacity and no anginal complaints. His LDL remained poorly controlled despite zetia and atorvastatin so he was recommended to start repatha and follow-up in 1 year. His last ischemic evaluation was a cardiac catheterization prior to his CABG which revealed severe multivessel CAD with severe LM and LAD and moderate RCA disease. Echo 04/2018 showed EF >65%, G1DD, and no significant valvular abnormalities.   He presents today for routine follow-up. He has done fairly well up until 2 months ago when he began experiencing band like chest tightness which would come on gradually throughout the afternoon and persist until he went to sleep. No clear exertional component or other associated symptoms. He thought perhaps he pulled a muscle fishing or shooting his bow, though denies any tenderness to palpation. He has also noted his SBP is often elevated to 140s in the evening. He has occasional orthostatic hypotension symptoms but no syncope. He denies palpitations, LE edema, orthopnea, PND, SOB, or PND. He reported having the feeling of heartburn/epigastic pain prior to needing his CABG and has not had any symptoms reminiscent of prior angina.   Past Medical History:  Diagnosis Date  . Anginal pain (HCC)    pt. denies  . Coronary artery disease   . GERD (gastroesophageal  reflux disease)   . Hyperlipidemia   . Hypertension   . Neck pain   . Sleep apnea    uses CPAP    Past Surgical History:  Procedure Laterality Date  . CARDIAC CATHETERIZATION    . CARPAL TUNNEL RELEASE Right 12/04/2018   Procedure: RIGHT CARPAL TUNNEL RELEASE;  Surgeon: Betha LoaKuzma, Kevin, MD;  Location: [facility];  Service: Orthopedics;  Laterality: Right;  . CARPAL TUNNEL RELEASE Left 01/11/2019   Procedure: LEFT CARPAL TUNNEL RELEASE;  Surgeon: Betha LoaKuzma, Kevin, MD;  Location: [facility];  Service: Orthopedics;  Laterality: Left;  Bier block  . CORONARY ARTERY BYPASS GRAFT N/A 05/04/2018   Procedure: CORONARY ARTERY BYPASS GRAFTING (CABG) x5 using right greater saphenous vein harvested endoscopically and bilateral left internal mammary arteries;  Surgeon: [name], [name], MD;  Location: [facility];  Service: Open Heart Surgery;  Laterality: N/A;  possible bilateral IMA  . ELBOW SURGERY    . ENDARTERECTOMY Right 08/11/2018   Procedure: right carotid ENDARTERECTOMY;  Surgeon: Larina EarthlyEarly, Todd F, MD;  [facility];  Service: Vascular;  Laterality: Right;  . HAND SURGERY    . LEFT HEART CATH AND CORONARY ANGIOGRAPHY N/A 05/03/2018   Procedure: LEFT HEART CATH AND CORONARY ANGIOGRAPHY;  Surgeon: Marykay LexHarding, David W, MD;  [facility];  Service: Cardiovascular;  Laterality: N/A;  . MASS EXCISION Right 10/31/2014   Procedure: EXCISION ANTERIOR NECK & RIGHT SUBMANDIBULAR CYSTS WITH COMPLEX CLOSURES;  Surgeon: Vernie MurdersPaul Juengel, MD;  Location:  [facility];  Service: ENT;  Laterality: Right;  CPAP  . PATCH ANGIOPLASTY Right 08/11/2018   Procedure: Patch Angioplasty of right carotid artery using hemashield platinum finesse patch;  Surgeon: [name], MD;  Location: [facility];  Service: Vascular;  Laterality: Right;  . TEE WITHOUT CARDIOVERSION N/A 05/04/2018   Procedure: TRANSESOPHAGEAL ECHOCARDIOGRAM (TEE);  Surgeon: [name], MD;  Location: [facility] OR;   Service: Open Heart Surgery;  Laterality: N/A;     Current Outpatient Medications  Medication Sig Dispense Refill  . acetaminophen (TYLENOL) 325 MG tablet Take 2 tablets (650 mg total) by mouth every 6 (six) hours as needed for mild pain.    [name] aspirin EC 81 MG tablet Take 81 mg by mouth daily. Swallow whole.    [name] atorvastatin (LIPITOR) 80 MG tablet Take 1 tablet (80 mg total) by mouth every evening. 30 tablet 5  . Evolocumab (REPATHA) 140 MG/ML SOSY Inject into the skin.    [name] sertraline (ZOLOFT) 50 MG tablet Take 50 mg by mouth daily.     . metoprolol tartrate (LOPRESSOR) 50 MG tablet Take 1 tablet (50 mg total) by mouth 2 (two) times daily. 180 tablet 3  . nitroGLYCERIN (NITROSTAT) 0.4 MG SL tablet Place 1 tablet (0.4 mg total) under the tongue every 5 (five) minutes as needed for chest pain. 25 tablet 3   No current facility-administered medications for this visit.    Allergies:   Patient has no known allergies.    Social History:  The patient  reports that he quit smoking about 20 years ago. He quit after 6.00 years of use. He quit smokeless tobacco use about 2 years ago.  His smokeless tobacco use included chew. He reports current alcohol use of about 1.0 standard drink of alcohol per week. He reports that he does not use drugs.   Family History:  The patient's family history includes Arthritis in his maternal grandfather and paternal grandmother; Healthy in his father and mother.    ROS:  Please see the history of present illness.   Otherwise, review of systems are positive for none.   All other systems are reviewed and negative.    PHYSICAL EXAM: VS:  BP 124/86   Pulse 70   Ht 6' (1.829 m)   Wt (!) 303 lb (137.4 kg)   BMI 41.09 kg/m  , BMI Body mass index is 41.09 kg/m. GEN: Well nourished, well developed, in no acute distress HEENT: sclera anicteric Neck: no JVD, carotid bruits, or masses Cardiac: RRR; no murmurs, rubs, or gallops, edema  Respiratory: clear to  auscultation bilaterally, normal work of breathing GI: soft, obese, nontender, nondistended, + BS MS: no deformity or atrophy Skin: warm and dry, no rash Neuro:  Strength and sensation are intact Psych: euthymic mood, full affect   EKG:  EKG is ordered today. The ekg ordered today demonstrates sinus rhythm with rate 70 bpm, no STE/D, no TWI   Recent Labs: No results found for requested labs within last 8760 hours.    Lipid Panel    Component Value Date/Time   CHOL 186 10/06/2018 0827   TRIG 162 (H) 10/06/2018 0827   HDL 35 (L) 10/06/2018 0827   CHOLHDL 5.3 (H) 10/06/2018 0827   CHOLHDL 7 03/04/2015 0825   VLDL 43.4 (H) 03/04/2015 0825   LDLCALC 119 (H) 10/06/2018 0827   LDLDIRECT 182.0 03/04/2015 0825      Wt Readings from Last 3 Encounters:  01/01/20 (!) 303 lb (137.4  kg)  01/11/19 269 lb 6.4 oz (122.2 kg)  12/04/18 262 lb 12.6 oz (119.2 kg)      Other studies Reviewed: Additional studies/ records that were reviewed today include:   Echocardiogram 04/2018: 1. The left ventricle has hyperdynamic systolic function, with an  ejection fraction of >65%. The cavity size was normal. Left ventricular  diastolic Doppler parameters are consistent with impaired relaxation.  2. The right ventricle has normal systolic function. The cavity was  normal. There is no increase in right ventricular wall thickness.  3. The mitral valve is normal in structure.  4. The tricuspid valve is normal in structure.  5. The aortic valve is tricuspid Mild thickening of the aortic valve Mild  calcification of the aortic valve.  6. The pulmonic valve was normal in structure.   Left heart catheterization 04/2018:  Ost LM to Dist LM lesion is 75% stenosed.  Prox LAD to Mid LAD lesion is 99% stenosed. Chronic subtotal occlusion with bridging, left left and right left collaterals  Ost RCA lesion is 50% stenosed. Prox RCA lesion is 50% stenosed. Mid RCA lesion is 55% stenosed. Post Atrio  lesion is 80% stenosed.  The left ventricular ejection fraction is 45-50% by visual estimate. Cannot exclude regional wall motion normality.  LV end diastolic pressure is normal.   SUMMARY  Severe multivessel CAD: 75% Left Main, subtotal 99-100% CTO of LAD after 1stDiag, diffuse moderate 50 to 60% disease in the ostial, proximal and mid RCA with 80% RPL 1  Borderline low LVEF of 50% on Myoview with no obvious wall motion normality (however not adequately imaged with LV gram).  Borderline elevated LVEDP.  RECOMMENDATIONS  Severe of coronary disease with Left Main and severe LAD disease, the patient had angina with angiographic images.  At this point I think the safest bet is to admit him to stepdown unit, will continue nitroglycerin infusion began here and start IV heparin 5 hours after TR band removal.  CVTS consultation has been placed  Low threshold to consider balloon pump support.  We will increase atorvastatin to 80 mg, continue beta-blocker that was restarted.  He was on ACE inhibitor, would continue that if blood pressure tolerates.    ASSESSMENT AND PLAN:  1. Atypical chest pain in patient with CAD s/p CABG 05/2018: he reports band-like chest discomfort for the past 2 months which gradually comes on throughout the afternoon, persisting until he goes to sleep. Not exacerbated by activity. No associated symptoms. ?coorelation with increased PM blood pressures - Will check a NST to r/o ischemia - Will reduce aspirin to 81mg  daily - Continue statin and repatha - Continue BBlocker  2. Carotid artery disease s/p right CEA 08/2018: followed by Dr. 09/2018 - Continue aspirin, statin, and repatha  3. HTN: BP 124/86 today, reports SBP in the 140s in the evening - Will increase metoprolol tartrate to 50 mg BID - We discussed orthostatic precautions  4. HLD: LDL 119 09/2018, subsequently started on repatha with recent LDL  91 and HDL 41 on 12/28/19 - Will refer to the lipid  clinic for LDL above goal despite atorvastatin and repatha - Continue atorvastatin and repatha  5. OSA on CPAP: reports compliance - Continue CPAP qHS    Current medicines are reviewed at length with the patient today.  The patient does not have concerns regarding medicines.  The following changes have been made:  As above  Labs/ tests ordered today include:   Orders Placed This Encounter  Procedures  .  MYOCARDIAL PERFUSION IMAGING  . EKG 12-Lead     Disposition:   FU with me via a virtual visit in 1 month.  Signed, Beatriz Stallion, PA-C  01/01/2020 9:09 AM

## 2020-01-01 ENCOUNTER — Encounter: Payer: Self-pay | Admitting: Cardiovascular Disease

## 2020-01-01 ENCOUNTER — Ambulatory Visit (INDEPENDENT_AMBULATORY_CARE_PROVIDER_SITE_OTHER): Payer: BC Managed Care – PPO | Admitting: Medical

## 2020-01-01 ENCOUNTER — Other Ambulatory Visit: Payer: Self-pay

## 2020-01-01 ENCOUNTER — Encounter: Payer: Self-pay | Admitting: Medical

## 2020-01-01 VITALS — BP 124/86 | HR 70 | Ht 72.0 in | Wt 303.0 lb

## 2020-01-01 DIAGNOSIS — R072 Precordial pain: Secondary | ICD-10-CM | POA: Diagnosis not present

## 2020-01-01 DIAGNOSIS — I10 Essential (primary) hypertension: Secondary | ICD-10-CM | POA: Diagnosis not present

## 2020-01-01 DIAGNOSIS — E782 Mixed hyperlipidemia: Secondary | ICD-10-CM

## 2020-01-01 DIAGNOSIS — Z9989 Dependence on other enabling machines and devices: Secondary | ICD-10-CM

## 2020-01-01 DIAGNOSIS — G4733 Obstructive sleep apnea (adult) (pediatric): Secondary | ICD-10-CM

## 2020-01-01 DIAGNOSIS — Z951 Presence of aortocoronary bypass graft: Secondary | ICD-10-CM | POA: Diagnosis not present

## 2020-01-01 DIAGNOSIS — I6523 Occlusion and stenosis of bilateral carotid arteries: Secondary | ICD-10-CM

## 2020-01-01 DIAGNOSIS — I25810 Atherosclerosis of coronary artery bypass graft(s) without angina pectoris: Secondary | ICD-10-CM

## 2020-01-01 MED ORDER — METOPROLOL TARTRATE 50 MG PO TABS: 50.0000 mg | ORAL_TABLET | Freq: Two times a day (BID) | ORAL | 3 refills | Status: DC

## 2020-01-01 MED ORDER — NITROGLYCERIN 0.4 MG SL SUBL: 0.4000 mg | SUBLINGUAL_TABLET | SUBLINGUAL | 3 refills | Status: DC | PRN

## 2020-01-01 NOTE — Progress Notes (Signed)
Thanks, agree w stress test. Hopefully a false alarm.

## 2020-01-01 NOTE — Patient Instructions (Signed)
Medication Instructions:  INCREASE- Metoprolol Tartrate 50 mg by mouth twice a day DECREASE- Aspirin 81 mg by mouth daily  *If you need a refill on your cardiac medications before your next appointment, please call your pharmacy*   Lab Work: None Ordered   Testing/Procedures: Your physician has requested that you have a lexiscan myoview. For further information please visit [email or web address] Please follow instruction sheet, as given.   Follow-Up: At [facility], you and your health needs are our priority.  As part of our continuing mission to provide you with exceptional heart care, we have created designated Provider Care Teams.  These Care Teams include your primary Cardiologist (physician) and Advanced Practice Providers (APPs -  Physician Assistants and Nurse Practitioners) who all work together to provide you with the care you need, when you need it.  We recommend signing up for the patient portal called "MyChart".  Sign up information is provided on this After Visit Summary.  MyChart is used to connect with patients for Virtual Visits (Telemedicine).  Patients are able to view lab/test results, encounter notes, upcoming appointments, etc.  Non-urgent messages can be sent to your provider as well.   To learn more about what you can do with MyChart, go to [email or web address].    Your next appointment:   1 month(s)  The format for your next appointment:   Virtual Visit   Provider:   You will see one of the following Advanced Practice Providers on your designated Care Team:     Judy Pimple, [place]    Your physician recommends that you schedule a follow-up appointment in: Lipid Clinic 2 weeks

## 2020-01-02 ENCOUNTER — Telehealth: Payer: Self-pay

## 2020-01-02 MED ORDER — REPATHA SURECLICK 140 MG/ML ~~LOC~~ SOAJ: 140.0000 mg | SUBCUTANEOUS | 11 refills | Status: DC

## 2020-01-02 NOTE — Telephone Encounter (Signed)
Called and spoke w/pt stated that they are approved for repatha and the copay card, rx sent, pt voiced nuderstanding and also instructed to complete fasting lipids.

## 2020-01-16 ENCOUNTER — Telehealth: Payer: Self-pay | Admitting: Pharmacist

## 2020-01-16 DIAGNOSIS — E7801 Familial hypercholesterolemia: Secondary | ICD-10-CM

## 2020-01-16 DIAGNOSIS — E782 Mixed hyperlipidemia: Secondary | ICD-10-CM

## 2020-01-16 MED ORDER — REPATHA PUSHTRONEX SYSTEM 420 MG/3.5ML ~~LOC~~ SOCT: 420.0000 mg | SUBCUTANEOUS | 11 refills | Status: DC

## 2020-01-16 NOTE — Telephone Encounter (Signed)
Patient reports LDL 119 ---> 91 on Repatha 140mg  and atorvastatin.  Denies missing any dose of Repatha or atorvastatin.   Blood work done only 3 days after Repatha dose received.   *WILL increase Repatha dose to 420mg  every 30 days, repeat fasting blood work in January, and re-assess need for Lipid Clinic follow up.  *Needs to keep low fat diet and positive lifestyle modification*

## 2020-01-17 NOTE — Telephone Encounter (Signed)
Called and lmomed the pt that the pa was approved and rx sent for repatha pushtronix and that they should have their copay card to make it cheaper and if they have issues to call us and we would be happy to help

## 2020-01-22 ENCOUNTER — Ambulatory Visit: Payer: BC Managed Care – PPO

## 2020-01-24 ENCOUNTER — Ambulatory Visit (HOSPITAL_COMMUNITY)
Admission: RE | Admit: 2020-01-24 | Payer: BC Managed Care – PPO | Source: Ambulatory Visit | Attending: Medical | Admitting: Medical

## 2020-01-25 ENCOUNTER — Ambulatory Visit (HOSPITAL_COMMUNITY): Payer: BC Managed Care – PPO

## 2020-02-13 NOTE — Progress Notes (Deleted)
Cardiology Office Note   Date:  02/13/2020   ID:  David Christian, DOB 07-Oct-1975, MRN [id number]  PCP:  David Done., MD  Cardiologist:  [name], MD EP: None  No chief complaint on file.     History of Present Illness: David Christian is a 44 y.o. male  with a PMH of CAD s/p CABG 05/2018, carotid artery disease s/p R CEA 08/2018, HTN, HLD, OSA on CPAP, obesity, and former smoker, who presents for 1 month follow-up of recent chest pain.  He was last evaluated by cardiology at an outpatient visit with myself 01/01/20, at which time he reported band-like chest tightness occurring in the afternoon and persisting until he went to sleep, though did not appear to have any exertional component. He was recommended to undergo a NST to further evaluate his symptoms, however patient has not completed this study His last ischemic evaluation was a cardiac catheterization prior to his CABG which revealed severe multivessel CAD with severe LM and LAD and moderate RCA disease. Echo 04/2018 showed EF >65%, G1DD, and no significant valvular abnormalities.    1. Atypical chest pain in patient with CAD s/p CABG 05/2018: he reports band-like chest discomfort for the past 2 months which gradually comes on throughout the afternoon, persisting until he goes to sleep. Not exacerbated by activity. No associated symptoms. ?coorelation with increased PM blood pressures - Will check a NST to r/o ischemia - Continue aspirin 81mg  daily - Continue statin and repatha - Continue BBlocker  2. Carotid artery disease s/p right CEA 08/2018: followed by Dr. 09/2018 - Continue aspirin, statin, and repatha  3. HTN: BP *** today, reports SBP in the 140s in the evening - Continue metoprolol tartrate 50 mg BID - We discussed orthostatic precautions  4. HLD: LDL 119 09/2018, subsequently started on repatha with recent LDL  91 and HDL 41 on 12/28/19. Repatha was then increased with plans to repeat lipids 03/2020 - Will  need FLP 03/2020 with follow-up in the lipid clinic thereafter - Continue atorvastatin and repatha  5. OSA on CPAP: reports compliance - Continue CPAP qHS   Past Medical History:  Diagnosis Date  . Anginal pain (HCC)    pt. denies  . Coronary artery disease   . GERD (gastroesophageal reflux disease)   . Hyperlipidemia   . Hypertension   . Neck pain   . Sleep apnea    uses CPAP    Past Surgical History:  Procedure Laterality Date  . CARDIAC CATHETERIZATION    . CARPAL TUNNEL RELEASE Right 12/04/2018   Procedure: RIGHT CARPAL TUNNEL RELEASE;  Surgeon: 12/06/2018, MD;  Location: [facility];  Service: Orthopedics;  Laterality: Right;  . CARPAL TUNNEL RELEASE Left 01/11/2019   Procedure: LEFT CARPAL TUNNEL RELEASE;  Surgeon: 13/07/2018, MD;  Location:  SURGERY CENTER;  Service: Orthopedics;  Laterality: Left;  Bier block  . CORONARY ARTERY BYPASS GRAFT N/A 05/04/2018   Procedure: CORONARY ARTERY BYPASS GRAFTING (CABG) x5 using right greater saphenous vein harvested endoscopically and bilateral left internal mammary arteries;  Surgeon: 05/06/2018, MD;  Location: [facility] OR;  Service: Open Heart Surgery;  Laterality: N/A;  possible bilateral IMA  . ELBOW SURGERY    . ENDARTERECTOMY Right 08/11/2018   Procedure: right carotid ENDARTERECTOMY;  Surgeon: 10/11/2018, MD;  Location: [facility] OR;  Service: Vascular;  Laterality: Right;  . HAND SURGERY    . LEFT HEART CATH AND CORONARY ANGIOGRAPHY N/A  05/03/2018   Procedure: LEFT HEART CATH AND CORONARY ANGIOGRAPHY;  Surgeon: [name], MD;  Location: [facility] INVASIVE CV LAB;  Service: Cardiovascular;  Laterality: N/A;  . MASS EXCISION Right 10/31/2014   Procedure: EXCISION ANTERIOR NECK & RIGHT SUBMANDIBULAR CYSTS WITH COMPLEX CLOSURES;  Surgeon: [name], MD;  Location: [facility];  Service: ENT;  Laterality: Right;  CPAP  . PATCH ANGIOPLASTY Right 08/11/2018   Procedure: Patch Angioplasty of right  carotid artery using hemashield platinum finesse patch;  Surgeon: [name], MD;  Location: [facility];  Service: Vascular;  Laterality: Right;  . TEE WITHOUT CARDIOVERSION N/A 05/04/2018   Procedure: TRANSESOPHAGEAL ECHOCARDIOGRAM (TEE);  Surgeon: [name], MD;  Location: [facility] OR;  Service: Open Heart Surgery;  Laterality: N/A;     Current Outpatient Medications  Medication Sig Dispense Refill  . acetaminophen (TYLENOL) 325 MG tablet Take 2 tablets (650 mg total) by mouth every 6 (six) hours as needed for mild pain.    [name] aspirin EC 81 MG tablet Take 81 mg by mouth daily. Swallow whole.    [name] atorvastatin (LIPITOR) 80 MG tablet Take 1 tablet (80 mg total) by mouth every evening. 30 tablet 5  . Evolocumab with Infusor (REPATHA PUSHTRONEX SYSTEM) 420 MG/3.5ML SOCT Inject 420 mg into the skin every 30 (thirty) days. 3.6 mL 11  . metoprolol tartrate (LOPRESSOR) 50 MG tablet Take 1 tablet (50 mg total) by mouth 2 (two) times daily. 180 tablet 3  . nitroGLYCERIN (NITROSTAT) 0.4 MG SL tablet Place 1 tablet (0.4 mg total) under the tongue every 5 (five) minutes as needed for chest pain. 25 tablet 3  . sertraline (ZOLOFT) 50 MG tablet Take 50 mg by mouth daily.      No current facility-administered medications for this visit.    Allergies:   Patient has no known allergies.    Social History:  The patient  reports that he quit smoking about 20 years ago. He quit after 6.00 years of use. He quit smokeless tobacco use about 2 years ago.  His smokeless tobacco use included chew. He reports current alcohol use of about 1.0 standard drink of alcohol per week. He reports that he does not use drugs.   Family History:  The patient's ***family history includes Arthritis in his maternal grandfather and paternal grandmother; Healthy in his father and mother.    ROS:  Please see the history of present illness.   Otherwise, review of systems are positive for {NONE DEFAULTED:18576::"none"}.   All other  systems are reviewed and negative.    PHYSICAL EXAM: VS:  There were no vitals taken for this visit. , BMI There is no height or weight on file to calculate BMI. GEN: Well nourished, well developed, in no acute distress HEENT: normal Neck: no JVD, carotid bruits, or masses Cardiac: ***RRR; no murmurs, rubs, or gallops,no edema  Respiratory:  clear to auscultation bilaterally, normal work of breathing GI: soft, nontender, nondistended, + BS MS: no deformity or atrophy Skin: warm and dry, no rash Neuro:  Strength and sensation are intact Psych: euthymic mood, full affect   EKG:  EKG {ACTION; IS/IS DTO:67124580} ordered today. The ekg ordered today demonstrates ***   Recent Labs: No results found for requested labs within last 8760 hours.    Lipid Panel    Component Value Date/Time   CHOL 186 10/06/2018 0827   TRIG 162 (H) 10/06/2018 0827   HDL 35 (L) 10/06/2018 0827   CHOLHDL 5.3 (H)  10/06/2018 0827   CHOLHDL 7 03/04/2015 0825   VLDL 43.4 (H) 03/04/2015 0825   LDLCALC 119 (H) 10/06/2018 0827   LDLDIRECT 182.0 03/04/2015 0825      Wt Readings from Last 3 Encounters:  01/01/20 (!) 303 lb (137.4 kg)  01/11/19 269 lb 6.4 oz (122.2 kg)  12/04/18 262 lb 12.6 oz (119.2 kg)      Other studies Reviewed: Additional studies/ records that were reviewed today include:   Echocardiogram 04/2018: 1. The left ventricle has hyperdynamic systolic function, with an  ejection fraction of >65%. The cavity size was normal. Left ventricular  diastolic Doppler parameters are consistent with impaired relaxation.  2. The right ventricle has normal systolic function. The cavity was  normal. There is no increase in right ventricular wall thickness.  3. The mitral valve is normal in structure.  4. The tricuspid valve is normal in structure.  5. The aortic valve is tricuspid Mild thickening of the aortic valve Mild  calcification of the aortic valve.  6. The pulmonic valve was normal in  structure.   Left heart catheterization 04/2018:  Ost LM to Dist LM lesion is 75% stenosed.  Prox LAD to Mid LAD lesion is 99% stenosed. Chronic subtotal occlusion with bridging, left left and right left collaterals  Ost RCA lesion is 50% stenosed. Prox RCA lesion is 50% stenosed. Mid RCA lesion is 55% stenosed. Post Atrio lesion is 80% stenosed.  The left ventricular ejection fraction is 45-50% by visual estimate. Cannot exclude regional wall motion normality.  LV end diastolic pressure is normal.  SUMMARY  Severe multivessel CAD: 75% Left Main, subtotal 99-100% CTO of LAD after 1stDiag, diffuse moderate 50 to 60% disease in the ostial, proximal and mid RCA with 80% RPL 1  Borderline low LVEF of 50% on Myoview with no obvious wall motion normality (however not adequately imaged with LV gram).  Borderline elevated LVEDP.  RECOMMENDATIONS  Severe of coronary disease with Left Main and severe LAD disease, the patient had angina with angiographic images. At this point I think the safest bet is to admit him to stepdown unit, will continue nitroglycerin infusion began here and start IV heparin 5 hours after TR band removal.  CVTS consultation has been placed  Low threshold to consider balloon pump support.  We will increase atorvastatin to 80 mg, continue beta-blocker that was restarted.  He was on ACE inhibitor, would continue that if blood pressure tolerates.     ASSESSMENT AND PLAN:  1.  ***   Current medicines are reviewed at length with the patient today.  The patient {ACTIONS; HAS/DOES NOT HAVE:19233} concerns regarding medicines.  The following changes have been made:  {PLAN; NO CHANGE:13088:s}  Labs/ tests ordered today include: *** No orders of the defined types were placed in this encounter.    Disposition:   FU with *** in {gen number 4-09:[id number]} {Days to years:10300}  Signed, Beatriz Stallion, PA-C  02/13/2020 9:26 PM

## 2020-02-18 ENCOUNTER — Ambulatory Visit: Payer: BC Managed Care – PPO | Admitting: Medical

## 2020-02-18 DIAGNOSIS — G4733 Obstructive sleep apnea (adult) (pediatric): Secondary | ICD-10-CM

## 2020-02-18 DIAGNOSIS — I25810 Atherosclerosis of coronary artery bypass graft(s) without angina pectoris: Secondary | ICD-10-CM

## 2020-02-18 DIAGNOSIS — I6523 Occlusion and stenosis of bilateral carotid arteries: Secondary | ICD-10-CM

## 2020-02-18 DIAGNOSIS — R079 Chest pain, unspecified: Secondary | ICD-10-CM

## 2020-02-18 DIAGNOSIS — I10 Essential (primary) hypertension: Secondary | ICD-10-CM

## 2020-02-18 DIAGNOSIS — E782 Mixed hyperlipidemia: Secondary | ICD-10-CM

## 2020-03-19 ENCOUNTER — Other Ambulatory Visit: Payer: Self-pay | Admitting: Cardiovascular Disease

## 2020-03-20 ENCOUNTER — Ambulatory Visit: Payer: Managed Care, Other (non HMO) | Admitting: Cardiovascular Disease

## 2020-03-24 DIAGNOSIS — U071 COVID-19: Secondary | ICD-10-CM | POA: Diagnosis not present

## 2020-03-27 ENCOUNTER — Ambulatory Visit: Payer: BC Managed Care – PPO | Admitting: Cardiovascular Disease

## 2020-03-27 DIAGNOSIS — Z20828 Contact with and (suspected) exposure to other viral communicable diseases: Secondary | ICD-10-CM | POA: Diagnosis not present

## 2020-03-27 DIAGNOSIS — J189 Pneumonia, unspecified organism: Secondary | ICD-10-CM | POA: Diagnosis not present

## 2020-03-27 DIAGNOSIS — R062 Wheezing: Secondary | ICD-10-CM | POA: Diagnosis not present

## 2020-06-24 DIAGNOSIS — E782 Mixed hyperlipidemia: Secondary | ICD-10-CM | POA: Diagnosis not present

## 2020-06-24 DIAGNOSIS — E7801 Familial hypercholesterolemia: Secondary | ICD-10-CM | POA: Diagnosis not present

## 2020-06-25 LAB — LIPID PANEL WITH LDL/HDL RATIO
Cholesterol, Total: 160 mg/dL (ref 100–199)
HDL: 33 mg/dL — ABNORMAL LOW (ref >39)
LDL Chol Calc (NIH): 95 mg/dL (ref 0–99)
LDL/HDL Ratio: 2.9 ratio (ref 0.0–3.6)
Triglycerides: 182 mg/dL — ABNORMAL HIGH (ref 0–149)
VLDL Cholesterol Cal: 32 mg/dL (ref 5–40)

## 2020-06-25 LAB — LIPOPROTEIN A (LPA): Lipoprotein (a): 59.3 nmol/L (ref <75.0)

## 2020-06-26 ENCOUNTER — Ambulatory Visit: Payer: BC Managed Care – PPO | Admitting: Cardiovascular Disease

## 2020-06-26 ENCOUNTER — Other Ambulatory Visit: Payer: Self-pay

## 2020-06-26 ENCOUNTER — Encounter: Payer: Self-pay | Admitting: Cardiovascular Disease

## 2020-06-26 VITALS — BP 124/82 | HR 71 | Ht 71.0 in | Wt 308.8 lb

## 2020-06-26 DIAGNOSIS — I6523 Occlusion and stenosis of bilateral carotid arteries: Secondary | ICD-10-CM

## 2020-06-26 DIAGNOSIS — E785 Hyperlipidemia, unspecified: Secondary | ICD-10-CM

## 2020-06-26 DIAGNOSIS — I10 Essential (primary) hypertension: Secondary | ICD-10-CM | POA: Diagnosis not present

## 2020-06-26 DIAGNOSIS — Z9989 Dependence on other enabling machines and devices: Secondary | ICD-10-CM

## 2020-06-26 DIAGNOSIS — G4733 Obstructive sleep apnea (adult) (pediatric): Secondary | ICD-10-CM

## 2020-06-26 DIAGNOSIS — I25810 Atherosclerosis of coronary artery bypass graft(s) without angina pectoris: Secondary | ICD-10-CM | POA: Diagnosis not present

## 2020-06-26 MED ORDER — EZETIMIBE 10 MG PO TABS: 10.0000 mg | ORAL_TABLET | Freq: Every day | ORAL | 3 refills | Status: AC

## 2020-06-26 NOTE — Patient Instructions (Signed)
Medication Instructions:  START Ezetimibe (Zetaia) 10 mg once daily  *If you need a refill on your cardiac medications before your next appointment, please call your pharmacy*   Lab Work: None ordered If you have labs (blood work) drawn today and your tests are completely normal, you will receive your results only by: [name] MyChart Message (if you have MyChart) OR . A paper copy in the mail If you have any lab test that is abnormal or we need to change your treatment, we will call you to review the results.   Testing/Procedures: None ordered   Follow-Up: At [facility], you and your health needs are our priority.  As part of our continuing mission to provide you with exceptional heart care, we have created designated Provider Care Teams.  These Care Teams include your primary Cardiologist (physician) and Advanced Practice Providers (APPs -  Physician Assistants and Nurse Practitioners) who all work together to provide you with the care you need, when you need it.  We recommend signing up for the patient portal called "MyChart".  Sign up information is provided on this After Visit Summary.  MyChart is used to connect with patients for Virtual Visits (Telemedicine).  Patients are able to view lab/test results, encounter notes, upcoming appointments, etc.  Non-urgent messages can be sent to your provider as well.   To learn more about what you can do with MyChart, go to [email or web address].    Your next appointment:   12 month(s)  The format for your next appointment:   In Person  Provider:   You may see [name], MD or one of the following Advanced Practice Providers on your designated Care Team:    Azalee Course, PA-C  [name], [place] or   Judy Pimple, [place]    Other Instructions A referral has been made to Dr. Dalbert Garnet with [facility]

## 2020-06-26 NOTE — Progress Notes (Signed)
Cardiology Office Note:    Date:  06/26/2020   ID:  [name], DOB [birth year], MRN [id number]  PCP:  Nonnie Done., MD  Cardiologist:  [name], MD  Electrophysiologist:  None   Referring MD: Nonnie Done., MD   Chief Complaint  Patient presents with  . Coronary Artery Disease    History of Present Illness:    David Christian is a 45 y.o. male with a hx of obesity, hyperlipidemia, hypertension, obstructive sleep apnea on CPAP, remote smoker, with multivessel CAD (cardiac cath in February 2020: left main 75%, proximal LAD 99%, ostial RCA 50% posterior lateral branch 80%) and asymptomatic right carotid stenosis, subsequently undergoing CABG x5 (06/02/2018, Dr. Dorris Fetch LIMA to LAD, RIMA to OM1, SVG to diagonal, sequential SVG to distal RCA and posterior lateral branch) followed by carotid endarterectomy on August 11, 2018 (Dr. Tawanna Cooler Early).  Following his bypass procedure he had a remarkable improvement in his functional status and has been asymptomatic ever since.  Unfortunately, his appetite also showed a marked improvement and he has gained 40 lb of weight in 2 years and is now morbidly obese with a BMI of 43.  He has occasional non-exertional parasternal chest tightness that "lasts all day" and improves with stretching. The patient specifically denies any chest pain with exertion, dyspnea at rest or with exertion, orthopnea, paroxysmal nocturnal dyspnea, syncope, palpitations, focal neurological deficits, intermittent claudication, lower extremity edema, unexplained weight gain, cough, hemoptysis or wheezing.  He is working full-time and is the father of 5 children, including 42-year-old twins.  He has very little free time.  He describes his wife is an excellent cook and he has been eating (too) well.  He had a markedly elevated baseline LDL cholesterol 205, suggestive of heterozygous familial hypercholesterolemia.  On maximum dose atorvastatin his LDL cholesterol was  still high at 119.  After adding a PCSK9 inhibitor his LDL cholesterol has improved but is still high at 95.  Unfortunately, he also has a low HDL cholesterol and this has deteriorated following his weight gain, around 40 at baseline, now down to 33.  Triglycerides are minimally elevated at 182 and his LP(a) is normal at 59.  He does not smoke.  He does not have diabetes mellitus.  His blood pressure is well controlled on a relatively low-dose of metoprolol.  An MRI of the cervical spine performed on October 05, 2018, showing mild disc degenerative changes at C5-C6 and C6-C7 with mild right-sided foraminal narrowing at C5-C6 but no definite compression  He quit smoking about 7 years ago after smoking roughly a pack a day for about 15 years.  Still uses tobacco dip.  He has severely elevated LDL cholesterol even on combination atorvastatin and ezetimibe.  Recent lipid profile is not available for review.  He has well treated systemic hypertension and is 100% compliant with CPAP for obstructive sleep apnea.  He denies daytime hypersomnolence.  He does not have diabetes and does not have a family history of premature coronary disease.  Both of his parents are sti ll alive and healthy.  Past Medical History:  Diagnosis Date  . Anginal pain (HCC)    pt. denies  . Coronary artery disease   . GERD (gastroesophageal reflux disease)   . Hyperlipidemia   . Hypertension   . Neck pain   . Sleep apnea    uses CPAP    Past Surgical History:  Procedure Laterality Date  . CARDIAC CATHETERIZATION    .  CARPAL TUNNEL RELEASE Right 12/04/2018   Procedure: RIGHT CARPAL TUNNEL RELEASE;  Surgeon: Betha Loa, MD;  Location: [facility];  Service: Orthopedics;  Laterality: Right;  . CARPAL TUNNEL RELEASE Left 01/11/2019   Procedure: LEFT CARPAL TUNNEL RELEASE;  Surgeon: Betha Loa, MD;  Location: [facility];  Service: Orthopedics;  Laterality: Left;  Bier block  . CORONARY ARTERY  BYPASS GRAFT N/A 05/04/2018   Procedure: CORONARY ARTERY BYPASS GRAFTING (CABG) x5 using right greater saphenous vein harvested endoscopically and bilateral left internal mammary arteries;  Surgeon: [name], MD;  Location: [facility] OR;  Service: Open Heart Surgery;  Laterality: N/A;  possible bilateral IMA  . ELBOW SURGERY    . ENDARTERECTOMY Right 08/11/2018   Procedure: right carotid ENDARTERECTOMY;  Surgeon: [name], MD;  [facility];  Service: Vascular;  Laterality: Right;  . HAND SURGERY    . LEFT HEART CATH AND CORONARY ANGIOGRAPHY N/A 05/03/2018   Procedure: LEFT HEART CATH AND CORONARY ANGIOGRAPHY;  Surgeon: [name], MD;  Location: [facility] CV [facility];  Service: Cardiovascular;  Laterality: N/A;  . MASS EXCISION Right 10/31/2014   Procedure: EXCISION ANTERIOR NECK & RIGHT SUBMANDIBULAR CYSTS WITH COMPLEX CLOSURES;  Surgeon: [name], MD;  Location: [facility];  Service: ENT;  Laterality: Right;  CPAP  . PATCH ANGIOPLASTY Right 08/11/2018   Procedure: Patch Angioplasty of right carotid artery using hemashield platinum finesse patch;  Surgeon: [name], MD;  Location: [facility] OR;  Service: Vascular;  Laterality: Right;  . TEE WITHOUT CARDIOVERSION N/A 05/04/2018   Procedure: TRANSESOPHAGEAL ECHOCARDIOGRAM (TEE);  Surgeon: [name], MD;  Location: [facility] OR;  Service: Open Heart Surgery;  Laterality: N/A;    Current Medications: Current Meds  Medication Sig  . acetaminophen (TYLENOL) 325 MG tablet Take 2 tablets (650 mg total) by mouth every 6 (six) hours as needed for mild pain.  [name] aspirin EC 81 MG tablet Take 81 mg by mouth daily. Swallow whole.  [name] atorvastatin (LIPITOR) 80 MG tablet Take 1 tablet (80 mg total) by mouth every evening.  . Evolocumab with Infusor (REPATHA PUSHTRONEX SYSTEM) 420 MG/3.5ML SOCT Inject 420 mg into the skin every 30 (thirty) days.  [name] ezetimibe (ZETIA) 10 MG tablet Take 1 tablet (10 mg total) by mouth daily.  .  metoprolol tartrate (LOPRESSOR) 50 MG tablet Take 1 tablet (50 mg total) by mouth 2 (two) times daily.  . nitroGLYCERIN (NITROSTAT) 0.4 MG SL tablet Place 1 tablet (0.4 mg total) under the tongue every 5 (five) minutes as needed for chest pain.  [name] sertraline (ZOLOFT) 50 MG tablet Take 50 mg by mouth daily.      Allergies:   Patient has no known allergies.   Social History   Socioeconomic History  . Marital status: Married    Spouse name: Not on file  . Number of children: 5  . Years of education: some college  . Highest education level: Not on file  Occupational History  . Occupation: Development worker, international aid  Tobacco Use  . Smoking status: Former Smoker    Years: 6.00    Quit date: 08/07/1999    Years since quitting: 20.9  . Smokeless tobacco: Former Neurosurgeon    Types: Chew    Quit date: 08/09/2017  Vaping Use  . Vaping Use: Never used  Substance and Sexual Activity  . Alcohol use: Yes    Alcohol/week: 1.0 standard drink    Types: 1 Cans of beer  per week    Comment: rare  . Drug use: No  . Sexual activity: Not on file  Other Topics Concern  . Not on file  Social History Narrative   Lives at home with wife and family.   Right-handed.   Rare caffeine.   Social Determinants of Health   Financial Resource Strain: Not on file  Food Insecurity: Not on file  Transportation Needs: Not on file  Physical Activity: Not on file  Stress: Not on file  Social Connections: Not on file     Family History: The patient's family history includes Arthritis in his maternal grandfather and paternal grandmother; Healthy in his father and mother. There is no history of Diabetes, Heart disease, or Stroke.  ROS:   Please see the history of present illness.    All other systems are reviewed and are negative  EKGs/Labs/Other Studies Reviewed:    The following studies were reviewed today: Nuclear stress test ECG and images  EKG:  EKG is  ordered today.  The ekg ordered today demonstrates sinus  rhythm without ischemic repolarization abnormalities  Recent Labs: No results found for requested labs within last 8760 hours.  Recent Lipid Panel    Component Value Date/Time   CHOL 160 06/24/2020 0829   TRIG 182 (H) 06/24/2020 0829   HDL 33 (L) 06/24/2020 0829   CHOLHDL 5.3 (H) 10/06/2018 0827   CHOLHDL 7 03/04/2015 0825   VLDL 43.4 (H) 03/04/2015 0825   LDLCALC 95 06/24/2020 0829   LDLDIRECT 182.0 03/04/2015 0825    Physical Exam:    VS:  BP 124/82   Pulse 71   Ht 5\' 11"  (1.803 m)   Wt (!) 308 lb 12.8 oz (140.1 kg)   SpO2 98%   BMI 43.07 kg/m     Wt Readings from Last 3 Encounters:  06/26/20 (!) 308 lb 12.8 oz (140.1 kg)  01/01/20 (!) 303 lb (137.4 kg)  01/11/19 269 lb 6.4 oz (122.2 kg)     GEN: Obese,  Well nourished, well developed in no acute distress HEENT: Normal NECK: No JVD; No carotid bruits LYMPHATICS: No lymphadenopathy CARDIAC: RRR, no murmurs, rubs, S4 gallop is present RESPIRATORY:  Clear to auscultation without rales, wheezing or rhonchi  ABDOMEN: Soft, non-tender, non-distended MUSCULOSKELETAL:  No edema; No deformity  SKIN: Warm and dry NEUROLOGIC:  Alert and oriented x 3 PSYCHIATRIC:  Normal affect   ASSESSMENT:    1. Coronary artery disease involving coronary bypass graft of native heart without angina pectoris   2. Carotid stenosis, bilateral   3. Dyslipidemia (high LDL; low HDL)   4. Essential hypertension   5. OSA on CPAP   6. Morbid obesity (HCC)    PLAN:    In order of problems listed above:  1. CAD: Asymptomatic. He is taking aspirin and beta-blockers.  He needs to restart exercise on a regular basis. Discussed healthy dietary changes. Recommended a calorie restricted, low carb diet with plenty of protein and unsaturated fat. 2. ASCVD s/p R CEA: No neuro complaints. 3. HLP: He clearly has heterozygous familial hypercholesterolemia and requires aggressive lipid-lowering for secondary prevention of atherosclerosis with severe  complications.  On max dose atorvastatin + Repatha , LDL has decreased >50%, but is still too high at 95. Will add the ezetimibe back. Will ask Dr. Rennis GoldenHilty I fthere are other interventions he would recommend (inclisiran?). 4. HTN: good control 5. OSA: Reports 100% compliance with therapy and good results symptomatically with CPAP. 6. Obesity: unfortunately he  has gained 40 lb and is now morbidly obese. He may need to consider bariatric surgery. [facility] to [facility].  Medication Adjustments/Labs and Tests Ordered: Current medicines are reviewed at length with the patient today.  Concerns regarding medicines are outlined above.  Orders Placed This Encounter  Procedures  . Ambulatory referral to [facility]   Meds ordered this encounter  Medications  . ezetimibe (ZETIA) 10 MG tablet    Sig: Take 1 tablet (10 mg total) by mouth daily.    Dispense:  90 tablet    Refill:  3    Patient Instructions  Medication Instructions:  START Ezetimibe (Zetaia) 10 mg once daily  *If you need a refill on your cardiac medications before your next appointment, please call your pharmacy*   Lab Work: None ordered If you have labs (blood work) drawn today and your tests are completely normal, you will receive your results only by: [name] MyChart Message (if you have MyChart) OR . A paper copy in the mail If you have any lab test that is abnormal or we need to change your treatment, we will call you to review the results.   Testing/Procedures: None ordered   Follow-Up: At [facility], you and your health needs are our priority.  As part of our continuing mission to provide you with exceptional heart care, we have created designated Provider Care Teams.  These Care Teams include your primary Cardiologist (physician) and Advanced Practice Providers (APPs -  Physician Assistants and Nurse Practitioners) who all work together to provide you with the care you need, when you need  it.  We recommend signing up for the patient portal called "MyChart".  Sign up information is provided on this After Visit Summary.  MyChart is used to connect with patients for Virtual Visits (Telemedicine).  Patients are able to view lab/test results, encounter notes, upcoming appointments, etc.  Non-urgent messages can be sent to your provider as well.   To learn more about what you can do with MyChart, go to [email or web address].    Your next appointment:   12 month(s)  The format for your next appointment:   In Person  Provider:   You may see [name], MD or one of the following Advanced Practice Providers on your designated Care Team:    Azalee Course, PA-C  [name], [place] or   Judy Pimple, [place]    Other Instructions A referral has been made to Dr. Dalbert Garnet with [facility]     Signed, [name], MD  06/26/2020 8:33 PM    [facility]

## 2021-01-05 ENCOUNTER — Other Ambulatory Visit: Payer: Self-pay | Admitting: Medical

## 2021-02-05 DIAGNOSIS — Z79899 Other long term (current) drug therapy: Secondary | ICD-10-CM | POA: Diagnosis not present

## 2021-02-05 DIAGNOSIS — F321 Major depressive disorder, single episode, moderate: Secondary | ICD-10-CM | POA: Diagnosis not present

## 2021-02-05 DIAGNOSIS — E7801 Familial hypercholesterolemia: Secondary | ICD-10-CM | POA: Diagnosis not present

## 2021-02-05 DIAGNOSIS — I10 Essential (primary) hypertension: Secondary | ICD-10-CM | POA: Diagnosis not present

## 2021-02-16 ENCOUNTER — Other Ambulatory Visit: Payer: Self-pay | Admitting: Cardiovascular Disease

## 2021-07-14 ENCOUNTER — Encounter: Payer: Self-pay | Admitting: Cardiovascular Disease

## 2021-07-15 MED ORDER — METOPROLOL TARTRATE 50 MG PO TABS: 50.0000 mg | ORAL_TABLET | Freq: Two times a day (BID) | ORAL | 0 refills | Status: DC

## 2021-07-24 ENCOUNTER — Encounter: Payer: Self-pay | Admitting: Cardiovascular Disease

## 2021-07-24 ENCOUNTER — Ambulatory Visit: Payer: BC Managed Care – PPO | Admitting: Cardiovascular Disease

## 2021-07-24 VITALS — BP 104/72 | HR 67 | Ht 72.0 in | Wt 286.4 lb

## 2021-07-24 DIAGNOSIS — I10 Essential (primary) hypertension: Secondary | ICD-10-CM | POA: Diagnosis not present

## 2021-07-24 DIAGNOSIS — I25810 Atherosclerosis of coronary artery bypass graft(s) without angina pectoris: Secondary | ICD-10-CM

## 2021-07-24 DIAGNOSIS — Z9989 Dependence on other enabling machines and devices: Secondary | ICD-10-CM

## 2021-07-24 DIAGNOSIS — E7801 Familial hypercholesterolemia: Secondary | ICD-10-CM

## 2021-07-24 DIAGNOSIS — I6523 Occlusion and stenosis of bilateral carotid arteries: Secondary | ICD-10-CM | POA: Diagnosis not present

## 2021-07-24 DIAGNOSIS — E118 Type 2 diabetes mellitus with unspecified complications: Secondary | ICD-10-CM

## 2021-07-24 DIAGNOSIS — G4733 Obstructive sleep apnea (adult) (pediatric): Secondary | ICD-10-CM

## 2021-07-24 MED ORDER — NITROGLYCERIN 0.4 MG SL SUBL: 0.4000 mg | SUBLINGUAL_TABLET | SUBLINGUAL | 3 refills | Status: AC | PRN

## 2021-07-24 NOTE — Patient Instructions (Signed)

## 2021-07-24 NOTE — Progress Notes (Signed)
Cardiology Office Note:    Date:  07/26/2021   ID:  David ButcherMatthew D Christian, DOB August 18, 1975, MRN [id number]  PCP:  Nonnie DoneSlatosky, John J., MD  Cardiologist:  Thurmon FairMihai Rekha Hobbins, MD  Electrophysiologist:  None   Referring MD: Nonnie DoneSlatosky, John J., MD   Chief Complaint  Patient presents with   Coronary Artery Disease    History of Present Illness:    David Christian is a 46 y.o. male with a hx of obesity, hyperlipidemia, hypertension, obstructive sleep apnea on CPAP, remote smoker, with multivessel CAD (cardiac cath in February 2020: left main 75%, proximal LAD 99%, ostial RCA 50% posterior lateral branch 80%) and asymptomatic right carotid stenosis, subsequently undergoing CABG x5 (06/02/2018, Dr. Dorris FetchHendrickson LIMA to LAD, RIMA to OM1, SVG to diagonal, sequential SVG to distal RCA and posterior lateral branch) followed by carotid endarterectomy on August 11, 2018 (Dr. Tawanna Coolerodd Early).  Following his bypass procedure he had a remarkable improvement in his functional status and has been asymptomatic ever since.    He has not done quite as well from metabolic point of view.  He gained back a lot of weight and had reached a BMI of 43.  Fortunately he is lost 20 pounds of that in the last year, and now his BMI is around 5039.  There was a simultaneous deterioration in glycemic control with a hemoglobin A1c that 3 months ago was 9.8%.  He is doing better now, his typical fasting blood sugars around 150 and his hemoglobin A1c checked just yesterday was down to 6.9%.  He is only on metformin for diabetes.  Note that in 2020 his hemoglobin A1c was completely normal at 5.3%.  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  LDL cholesterol a year ago was 95 and HDL was 33.  Although not at the typical target less than 70, this is a big improvement from his baseline LDL cholesterol of 205.  LP(a) is not elevated.  He has  no cardiovascular complaints.The patient specifically denies any chest pain at rest exertion, dyspnea at rest or with exertion, orthopnea, paroxysmal nocturnal dyspnea, syncope, palpitations, focal neurological deficits, intermittent claudication, lower extremity edema, unexplained weight gain, cough, hemoptysis or wheezing.  He reports compliance with CPAP for obstructive sleep apnea and he denies daytime hypersomnolence  He is working full-time and is the father of 5 children, including [id number]-year-old twins.  He has very little free time.  He describes his wife is an excellent cook and he has been eating (too) well.  He does not smoke.  His blood pressure is normal (he only takes a low-dose of metoprolol).  An MRI of the cervical spine performed on October 05, 2018, showing mild disc degenerative changes at C5-C6 and C6-C7 with mild right-sided foraminal narrowing at C5-C6 but no definite compression  He quit smoking about  8years ago after smoking roughly a pack a day for about 15 years.  Still uses tobacco dip.  He has severely elevated LDL cholesterol even on combination atorvastatin and ezetimibe.   He has well treated systemic hypertension and is 100% compliant with CPAP for obstructive sleep apnea.  He denies daytime hypersomnolence. Both of his parents are still alive and healthy.  Past Medical History:  Diagnosis Date   Anginal pain (HCC)    pt. denies   Coronary artery disease    GERD (gastroesophageal reflux disease)    Hyperlipidemia    Hypertension  Neck pain    Sleep apnea    uses CPAP    Past Surgical History:  Procedure Laterality Date   CARDIAC CATHETERIZATION     CARPAL TUNNEL RELEASE Right 12/04/2018   Procedure: RIGHT CARPAL TUNNEL RELEASE;  Surgeon: Betha Loa, MD;  Location: [facility];  Service: Orthopedics;  Laterality: Right;   CARPAL TUNNEL RELEASE Left 01/11/2019   Procedure: LEFT CARPAL TUNNEL RELEASE;  Surgeon: Betha Loa, MD;  Location: [facility];  Service: Orthopedics;  Laterality: Left;  Bier block   CORONARY ARTERY BYPASS GRAFT N/A 05/04/2018   Procedure: CORONARY ARTERY BYPASS GRAFTING (CABG) x5 using right greater saphenous vein harvested endoscopically and bilateral left internal mammary arteries;  Surgeon: [name], MD;  Location: [facility] OR;  Service: Open Heart Surgery;  Laterality: N/A;  possible bilateral IMA   ELBOW SURGERY     ENDARTERECTOMY Right 08/11/2018   Procedure: right carotid ENDARTERECTOMY;  Surgeon: [name], MD;  [facility] OR;  Service: Vascular;  Laterality: Right;   HAND SURGERY     LEFT HEART CATH AND CORONARY ANGIOGRAPHY N/A 05/03/2018   Procedure: LEFT HEART CATH AND CORONARY ANGIOGRAPHY;  Surgeon: [name], MD;  Location: [facility];  Service: Cardiovascular;  Laterality: N/A;   MASS EXCISION Right 10/31/2014   Procedure: EXCISION ANTERIOR NECK & RIGHT SUBMANDIBULAR CYSTS WITH COMPLEX CLOSURES;  Surgeon: [name], MD;  Location: [facility];  Service: ENT;  Laterality: Right;  CPAP   PATCH ANGIOPLASTY Right 08/11/2018   Procedure: Patch Angioplasty of right carotid artery using hemashield platinum finesse patch;  Surgeon: [name], MD;  Location: [facility] OR;  Service: Vascular;  Laterality: Right;   TEE WITHOUT CARDIOVERSION N/A 05/04/2018   Procedure: TRANSESOPHAGEAL ECHOCARDIOGRAM (TEE);  Surgeon: [name], MD;  Location: [facility] OR;  Service: Open Heart Surgery;  Laterality: N/A;    Current Medications: Current Meds  Medication Sig   aspirin EC 81 MG tablet Take 81 mg by mouth daily. Swallow whole.   atorvastatin (LIPITOR) 80 MG tablet Take 1 tablet (80 mg total) by mouth every evening.   Evolocumab with Infusor (REPATHA PUSHTRONEX SYSTEM) 420 MG/3.5ML SOCT INJECT  INTO THE SKIN EVERY 30 DAYS   ezetimibe (ZETIA) 10 MG tablet Take 1 tablet (10 mg total) by mouth daily.   metFORMIN (GLUCOPHAGE) 1000 MG tablet Take 1,000 mg by mouth 2  (two) times daily.   metoprolol tartrate (LOPRESSOR) 50 MG tablet Take 1 tablet (50 mg total) by mouth 2 (two) times daily. Pt needs to make appt with provider for additional refills   sertraline (ZOLOFT) 100 MG tablet Take 100 mg by mouth daily.     Allergies:   Patient has no known allergies.   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   Right-handed.   Rare caffeine.   Social Determinants of Health   Financial Resource Strain: Not on file  Food Insecurity: Not on file  Transportation Needs: Not on file  Physical Activity: Not on file  Stress: Not on file  Social Connections: Not on file     Family History: The patient's family history includes Arthritis in his maternal grandfather and paternal grandmother; Healthy in his father and mother. There is no history of Diabetes, Heart disease, or Stroke.  ROS:   Please see the history of present illness.    All other systems are reviewed and are negative  EKGs/Labs/Other Studies Reviewed:    The following studies were reviewed today:   EKG:  EKG is  ordered today.  Shows normal sinus rhythm and other than voltage criteria for LVH is a completely normal tracing.  Recent Labs: No results found for  requested labs within last 8760 hours.  Recent Lipid Panel    Component Value Date/Time   CHOL 160 06/24/2020 0829   TRIG 182 (H) 06/24/2020 0829   HDL 33 (L) 06/24/2020 0829   CHOLHDL 5.3 (H) 10/06/2018 0827   CHOLHDL 7 03/04/2015 0825   VLDL 43.4 (H) 03/04/2015 0825   LDLCALC 95 06/24/2020 0829   LDLDIRECT 182.0 03/04/2015 0825    Physical Exam:    VS:  BP 104/72 (BP Location: Left Arm, Patient Position: Sitting, Cuff Size: Large)   Pulse 67   Ht 6' (1.829 m)   Wt 286 lb 6.4 oz (129.9 kg)   SpO2 92%   BMI 38.84 kg/m     Wt Readings from Last 3 Encounters:  07/24/21 286 lb 6.4 oz (129.9 kg)  06/26/20 (!) 308 lb 12.8 oz (140.1 kg)  01/01/20 (!) 303 lb (137.4 kg)     General: Alert, oriented x3, no distress, severely obese Head: no evidence of trauma, PERRL, EOMI, no exophtalmos or lid lag, no myxedema, no xanthelasma; normal ears, nose and oropharynx Neck: normal jugular venous pulsations and no hepatojugular reflux; brisk carotid pulses without delay and no carotid bruits Chest: clear to auscultation, no signs of consolidation by percussion or palpation, normal fremitus, symmetrical and full respiratory excursions Cardiovascular: normal position and quality of the apical impulse, regular rhythm, normal first and second heart sounds, no murmurs, rubs or gallops Abdomen: no tenderness or distention, no masses by palpation, no abnormal pulsatility or arterial bruits, normal bowel sounds, no hepatosplenomegaly Extremities: no clubbing, cyanosis or edema; 2+ radial, ulnar and brachial pulses bilaterally; 2+ right femoral, posterior tibial and dorsalis pedis pulses; 2+ left femoral, posterior tibial and dorsalis pedis pulses; no subclavian or femoral bruits Neurological: grossly nonfocal Psych: Normal mood and affect   ASSESSMENT:    1. Coronary artery disease involving coronary bypass graft of native heart without angina pectoris   2. Carotid stenosis, bilateral   3.  Heterozygous familial hypercholesterolemia   4. Essential hypertension   5. OSA on CPAP   6. Severe obesity (BMI 35.0-39.9) with comorbidity (HCC)   7. Type 2 diabetes mellitus with complication, without long-term current use of insulin (HCC)     PLAN:    In order of problems listed above:  CAD: Asymptomatic, on aspirin, beta-blockers and aggressive  lipid-lowering therapy.  Had extremely early onset CAD. ASCVD s/p R CEA: No neurological complaints.  I do not think he has had carotid duplex follow-up since his endarterectomy in 2020. HLP: He has heterozygous familial hypercholesterolemia and requires aggressive lipid-lowering for secondary prevention of atherosclerosis with severe complications.  He is on maximum dose atorvastatin, Repatha and ezetimibe.  Time to repeat a lipid profile (he will check soon w Dr. Egbert Garibaldi).  Weight loss would be of great benefit, especially to help increase his HDL. HTN: Excellent control. OSA: Compliant with CPAP 100%, denies daytime hypersomnolence. Obesity: After gaining 40 pounds in 2021-2022, he has lost 20 pounds and is no longer morbidly obese range.  Unfortunately, his weight gain has been associated with development of diabetes mellitus. DM: He readily admits that he was poorly compliant with carbohydrate restriction.  His hemoglobin A1c jumped to 9.8%, but in just 3 months he has managed to bring it down to 6.9% on monotherapy with metformin.  If he requires additional medications for diabetes control would strongly recommend GLP-1 agonist (these would help with weight loss) and/or SGLT2 inhibitors, both of which would improve his long-term cardiovascular prognosis.  Medication Adjustments/Labs and Tests Ordered: Current medicines are reviewed at length with the patient today.  Concerns regarding medicines are outlined above.  Orders Placed This Encounter  Procedures   EKG 12-Lead   Meds ordered this encounter  Medications   nitroGLYCERIN  (NITROSTAT) 0.4 MG SL tablet    Sig: Place 1 tablet (0.4 mg total) under the tongue every 5 (five) minutes as needed for chest pain.    Dispense:  25 tablet    Refill:  3    Patient Instructions  Medication Instructions:  No changes  *If you need a refill on your cardiac medications before your next appointment, please call your pharmacy*   Lab Work: None ordered If you have labs (blood work) drawn today and your tests are completely normal, you will receive your results only by: MyChart Message (if you have MyChart) OR A paper copy in the mail If you have any lab test that is abnormal or we need to change your treatment, we will call you to review the results.   Testing/Procedures: None ordered   Follow-Up: At [facility], you and your health needs are our priority.  As part of our continuing mission to provide you with exceptional heart care, we have created designated Provider Care Teams.  These Care Teams include your primary Cardiologist (physician) and Advanced Practice Providers (APPs -  Physician Assistants and Nurse Practitioners) who all work together to provide you with the care you need, when you need it.  We recommend signing up for the patient portal called "MyChart".  Sign up information is provided on this After Visit Summary.  MyChart is used to connect with patients for Virtual Visits (Telemedicine).  Patients are able to view lab/test results, encounter notes, upcoming appointments, etc.  Non-urgent messages can be sent to your provider as well.   To learn more about what you can do with MyChart, go to [email or web address].    Your next appointment:   12 month(s)  The format for your next appointment:   In Person  Provider:   [name], MD {   Important Information About Sugar         Signed, [name], MD  07/26/2021 8:17 PM    [facility]

## 2021-07-26 ENCOUNTER — Encounter: Payer: Self-pay | Admitting: Cardiovascular Disease

## 2021-07-28 ENCOUNTER — Other Ambulatory Visit: Payer: Self-pay | Admitting: *Deleted

## 2021-07-28 DIAGNOSIS — I6523 Occlusion and stenosis of bilateral carotid arteries: Secondary | ICD-10-CM

## 2021-08-07 ENCOUNTER — Inpatient Hospital Stay (HOSPITAL_COMMUNITY): Admission: RE | Admit: 2021-08-07 | Payer: BC Managed Care – PPO | Source: Ambulatory Visit

## 2021-08-12 ENCOUNTER — Other Ambulatory Visit: Payer: Self-pay | Admitting: Cardiovascular Disease

## 2021-08-19 ENCOUNTER — Ambulatory Visit (HOSPITAL_COMMUNITY)
Admission: RE | Admit: 2021-08-19 | Discharge: 2021-08-19 | Disposition: A | Discharge status: Home / Self Care | Payer: BC Managed Care – PPO | Source: Ambulatory Visit | Attending: Cardiovascular Disease | Admitting: Cardiovascular Disease

## 2021-08-19 DIAGNOSIS — I6523 Occlusion and stenosis of bilateral carotid arteries: Secondary | ICD-10-CM

## 2021-09-02 ENCOUNTER — Encounter: Payer: Self-pay | Admitting: *Deleted

## 2021-09-02 ENCOUNTER — Other Ambulatory Visit: Payer: Self-pay | Admitting: *Deleted

## 2021-09-02 DIAGNOSIS — I6523 Occlusion and stenosis of bilateral carotid arteries: Secondary | ICD-10-CM

## 2021-12-19 ENCOUNTER — Telehealth: Payer: Self-pay | Admitting: Pharmacist Clinician (PhC)/ Clinical Pharmacy Specialist

## 2021-12-19 NOTE — Telephone Encounter (Signed)
Renewal PA for Repatha sent to [facility] 10/14  Key:  [id number]

## 2021-12-24 MED ORDER — REPATHA PUSHTRONEX SYSTEM 420 MG/3.5ML ~~LOC~~ SOCT: SUBCUTANEOUS | 11 refills | Status: DC

## 2021-12-24 NOTE — Telephone Encounter (Signed)
PA renewed to 12/18/22

## 2022-08-15 ENCOUNTER — Other Ambulatory Visit: Payer: Self-pay | Admitting: Cardiovascular Disease

## 2022-11-15 ENCOUNTER — Other Ambulatory Visit: Payer: Self-pay | Admitting: Cardiovascular Disease

## 2022-12-08 ENCOUNTER — Other Ambulatory Visit (HOSPITAL_COMMUNITY): Payer: Self-pay

## 2022-12-08 ENCOUNTER — Telehealth: Payer: Self-pay | Admitting: Pharmacy Technician

## 2022-12-08 NOTE — Telephone Encounter (Signed)
Pharmacy Patient Advocate Encounter  Received notification from [facility] that Prior Authorization for repatha has been APPROVED from 12/08/22 to 12/08/23. Ran test claim, Copay is $35.00- one month. This test claim was processed through [facility]- copay amounts may vary at other pharmacies due to pharmacy/plan contracts, or as the patient moves through the different stages of their insurance plan.   PA #/Case ID/Reference #: [id number]

## 2022-12-08 NOTE — Telephone Encounter (Signed)
Pharmacy Patient Advocate Encounter   Received notification from CoverMyMeds that prior authorization for repatha is required/requested.   Insurance verification completed.   The patient is insured through [facility] .   Per test claim: PA required; PA started via CoverMyMeds. KEY [id number] . Waiting for clinical questions to populate.

## 2022-12-08 NOTE — Telephone Encounter (Signed)
Pharmacy Patient Advocate Encounter   Received notification from CoverMyMeds that prior authorization for repatha is required/requested.   Insurance verification completed.   The patient is insured through [facility] .   Per test claim: PA required; PA submitted to [facility] via CoverMyMeds Key/confirmation #/[id number] Status is pending

## 2022-12-10 ENCOUNTER — Ambulatory Visit (HOSPITAL_COMMUNITY): Admission: RE | Admit: 2022-12-10 | Payer: Self-pay | Source: Ambulatory Visit

## 2022-12-10 ENCOUNTER — Other Ambulatory Visit (HOSPITAL_COMMUNITY): Payer: Self-pay | Admitting: Cardiovascular Disease

## 2022-12-10 DIAGNOSIS — I6523 Occlusion and stenosis of bilateral carotid arteries: Secondary | ICD-10-CM

## 2022-12-15 MED ORDER — REPATHA SURECLICK 140 MG/ML ~~LOC~~ SOAJ: 140.0000 mg | SUBCUTANEOUS | 11 refills | Status: AC

## 2022-12-15 NOTE — Addendum Note (Signed)
Addended by: Scheryl Marten on: 12/15/2022 03:04 PM   Modules accepted: Orders

## 2022-12-15 NOTE — Telephone Encounter (Signed)
Called and spoke with Crystal (dpr) informed her that the PA was approved. RX sent to preferred pharmacy. She verbalized understanding

## 2022-12-24 ENCOUNTER — Ambulatory Visit (HOSPITAL_COMMUNITY): Admission: RE | Admit: 2022-12-24 | Payer: Self-pay | Source: Ambulatory Visit

## 2023-02-07 ENCOUNTER — Ambulatory Visit (HOSPITAL_COMMUNITY): Admission: RE | Admit: 2023-02-07 | Payer: Self-pay | Source: Ambulatory Visit

## 2023-02-18 ENCOUNTER — Other Ambulatory Visit: Payer: Self-pay | Admitting: Cardiovascular Disease

## 2023-03-14 ENCOUNTER — Other Ambulatory Visit: Payer: Self-pay | Admitting: Cardiovascular Disease

## 2023-04-02 ENCOUNTER — Other Ambulatory Visit: Payer: Self-pay | Admitting: Cardiovascular Disease

## 2023-04-04 NOTE — Telephone Encounter (Signed)
RX sent

## 2023-04-04 NOTE — Telephone Encounter (Signed)
Please refill at 25 mg twice daily for 30 days only. That way he will be weaning down rather than stopping beta blockers abruptly.

## 2023-04-04 NOTE — Telephone Encounter (Signed)
Pt of Dr. Royann Shivers. Last seen in 2023. He is passed his 3rd attempt. Does Dr. Royann Shivers want to refill? Please advise.

## 2023-12-22 ENCOUNTER — Other Ambulatory Visit: Payer: Self-pay | Admitting: Cardiovascular Disease
